# Patient Record
Sex: Male | Born: 1945
Health system: Southern US, Community
[De-identification: ages and names within clinical notes are randomized; demographics above are authoritative.]

## PROBLEM LIST (undated history)

## (undated) DIAGNOSIS — J449 Chronic obstructive pulmonary disease, unspecified: Secondary | ICD-10-CM

## (undated) DIAGNOSIS — K279 Peptic ulcer, site unspecified, unspecified as acute or chronic, without hemorrhage or perforation: Secondary | ICD-10-CM

## (undated) DIAGNOSIS — F329 Major depressive disorder, single episode, unspecified: Secondary | ICD-10-CM

## (undated) DIAGNOSIS — K219 Gastro-esophageal reflux disease without esophagitis: Secondary | ICD-10-CM

## (undated) DIAGNOSIS — J45909 Unspecified asthma, uncomplicated: Secondary | ICD-10-CM

## (undated) DIAGNOSIS — R05 Cough: Secondary | ICD-10-CM

## (undated) DIAGNOSIS — R911 Solitary pulmonary nodule: Secondary | ICD-10-CM

## (undated) DIAGNOSIS — C61 Malignant neoplasm of prostate: Secondary | ICD-10-CM

## (undated) DIAGNOSIS — S82841A Displaced bimalleolar fracture of right lower leg, initial encounter for closed fracture: Secondary | ICD-10-CM

## (undated) DIAGNOSIS — R053 Chronic cough: Secondary | ICD-10-CM

## (undated) DIAGNOSIS — I2699 Other pulmonary embolism without acute cor pulmonale: Secondary | ICD-10-CM

## (undated) DIAGNOSIS — I10 Essential (primary) hypertension: Secondary | ICD-10-CM

## (undated) DIAGNOSIS — E785 Hyperlipidemia, unspecified: Secondary | ICD-10-CM

## (undated) DIAGNOSIS — K2270 Barrett's esophagus without dysplasia: Secondary | ICD-10-CM

## (undated) DIAGNOSIS — J984 Other disorders of lung: Secondary | ICD-10-CM

## (undated) HISTORY — DX: Gastro-esophageal reflux disease without esophagitis: K21.9

## (undated) HISTORY — DX: Hyperlipidemia, unspecified: E78.5

## (undated) HISTORY — DX: Malignant neoplasm of prostate: C61

## (undated) HISTORY — DX: Chronic cough: R05.3

## (undated) HISTORY — DX: Other disorders of lung: J98.4

## (undated) HISTORY — DX: Cough: R05

## (undated) HISTORY — DX: Other pulmonary embolism without acute cor pulmonale: I26.99

## (undated) HISTORY — DX: Chronic obstructive pulmonary disease, unspecified: J44.9

## (undated) HISTORY — DX: Peptic ulcer, site unspecified, unspecified as acute or chronic, without hemorrhage or perforation: K27.9

## (undated) HISTORY — DX: Barrett's esophagus without dysplasia: K22.70

## (undated) HISTORY — DX: Major depressive disorder, single episode, unspecified: F32.9

## (undated) HISTORY — DX: Solitary pulmonary nodule: R91.1

---

## 1965-08-24 DIAGNOSIS — K279 Peptic ulcer, site unspecified, unspecified as acute or chronic, without hemorrhage or perforation: Secondary | ICD-10-CM

## 1965-08-24 DIAGNOSIS — I2699 Other pulmonary embolism without acute cor pulmonale: Secondary | ICD-10-CM

## 1965-08-24 HISTORY — DX: Peptic ulcer, site unspecified, unspecified as acute or chronic, without hemorrhage or perforation: K27.9

## 1965-08-24 HISTORY — PX: REPAIR OF PERFORATED ULCER: SHX6065

## 1965-08-24 HISTORY — DX: Other pulmonary embolism without acute cor pulmonale: I26.99

## 1997-08-24 DIAGNOSIS — F32A Depression, unspecified: Secondary | ICD-10-CM

## 1997-08-24 HISTORY — DX: Depression, unspecified: F32.A

## 1997-08-24 HISTORY — PX: PROSTATECTOMY: SHX69

## 1998-03-08 ENCOUNTER — Inpatient Hospital Stay (HOSPITAL_COMMUNITY): Admission: RE | Admit: 1998-03-08 | Discharge: 1998-03-11 | Payer: Self-pay | Admitting: Urology

## 1999-12-22 ENCOUNTER — Emergency Department (HOSPITAL_COMMUNITY): Admission: EM | Admit: 1999-12-22 | Discharge: 1999-12-22 | Payer: Self-pay | Admitting: Emergency Medicine

## 1999-12-22 ENCOUNTER — Encounter: Payer: Self-pay | Admitting: Emergency Medicine

## 1999-12-23 ENCOUNTER — Encounter: Payer: Self-pay | Admitting: Emergency Medicine

## 2000-06-16 ENCOUNTER — Encounter: Admission: RE | Admit: 2000-06-16 | Discharge: 2000-06-16 | Payer: Self-pay | Admitting: Family Medicine

## 2000-06-16 ENCOUNTER — Encounter: Payer: Self-pay | Admitting: Family Medicine

## 2001-08-24 DIAGNOSIS — C61 Malignant neoplasm of prostate: Secondary | ICD-10-CM

## 2001-08-24 HISTORY — DX: Malignant neoplasm of prostate: C61

## 2001-10-04 ENCOUNTER — Ambulatory Visit: Admission: RE | Admit: 2001-10-04 | Discharge: 2002-01-02 | Payer: Self-pay | Admitting: Radiation Oncology

## 2002-07-07 ENCOUNTER — Ambulatory Visit (HOSPITAL_COMMUNITY): Admission: RE | Admit: 2002-07-07 | Discharge: 2002-07-07 | Payer: Self-pay | Admitting: Gastroenterology

## 2002-07-07 ENCOUNTER — Encounter (INDEPENDENT_AMBULATORY_CARE_PROVIDER_SITE_OTHER): Payer: Self-pay | Admitting: Specialist

## 2004-08-03 ENCOUNTER — Emergency Department (HOSPITAL_COMMUNITY): Admission: EM | Admit: 2004-08-03 | Discharge: 2004-08-03 | Payer: Self-pay | Admitting: *Deleted

## 2005-03-16 ENCOUNTER — Ambulatory Visit (HOSPITAL_BASED_OUTPATIENT_CLINIC_OR_DEPARTMENT_OTHER): Admission: RE | Admit: 2005-03-16 | Discharge: 2005-03-16 | Payer: Self-pay | Admitting: Podiatry

## 2005-03-16 ENCOUNTER — Ambulatory Visit (HOSPITAL_COMMUNITY): Admission: RE | Admit: 2005-03-16 | Discharge: 2005-03-16 | Payer: Self-pay | Admitting: Podiatry

## 2005-04-10 ENCOUNTER — Ambulatory Visit (HOSPITAL_BASED_OUTPATIENT_CLINIC_OR_DEPARTMENT_OTHER): Admission: RE | Admit: 2005-04-10 | Discharge: 2005-04-10 | Payer: Self-pay | Admitting: Urology

## 2005-04-10 ENCOUNTER — Ambulatory Visit (HOSPITAL_COMMUNITY): Admission: RE | Admit: 2005-04-10 | Discharge: 2005-04-10 | Payer: Self-pay | Admitting: Urology

## 2007-08-11 ENCOUNTER — Encounter: Payer: Self-pay | Admitting: Podiatry

## 2007-08-11 ENCOUNTER — Ambulatory Visit (HOSPITAL_BASED_OUTPATIENT_CLINIC_OR_DEPARTMENT_OTHER): Admission: RE | Admit: 2007-08-11 | Discharge: 2007-08-11 | Payer: Self-pay | Admitting: Podiatry

## 2009-02-22 ENCOUNTER — Observation Stay (HOSPITAL_COMMUNITY): Admission: EM | Admit: 2009-02-22 | Discharge: 2009-02-22 | Payer: Self-pay | Admitting: Emergency Medicine

## 2009-03-26 ENCOUNTER — Encounter: Payer: Self-pay | Admitting: Internal Medicine

## 2009-04-26 ENCOUNTER — Encounter: Payer: Self-pay | Admitting: Internal Medicine

## 2009-06-12 ENCOUNTER — Ambulatory Visit (HOSPITAL_COMMUNITY): Admission: RE | Admit: 2009-06-12 | Discharge: 2009-06-12 | Payer: Self-pay | Admitting: Family Medicine

## 2009-06-12 ENCOUNTER — Encounter: Payer: Self-pay | Admitting: Internal Medicine

## 2009-11-05 ENCOUNTER — Encounter: Payer: Self-pay | Admitting: Internal Medicine

## 2009-12-12 ENCOUNTER — Ambulatory Visit: Payer: Self-pay | Admitting: Internal Medicine

## 2009-12-12 DIAGNOSIS — Z8711 Personal history of peptic ulcer disease: Secondary | ICD-10-CM | POA: Insufficient documentation

## 2009-12-12 DIAGNOSIS — R059 Cough, unspecified: Secondary | ICD-10-CM | POA: Insufficient documentation

## 2009-12-12 DIAGNOSIS — K2270 Barrett's esophagus without dysplasia: Secondary | ICD-10-CM | POA: Insufficient documentation

## 2009-12-12 DIAGNOSIS — K219 Gastro-esophageal reflux disease without esophagitis: Secondary | ICD-10-CM | POA: Insufficient documentation

## 2009-12-12 DIAGNOSIS — R05 Cough: Secondary | ICD-10-CM

## 2009-12-12 DIAGNOSIS — J441 Chronic obstructive pulmonary disease with (acute) exacerbation: Secondary | ICD-10-CM | POA: Insufficient documentation

## 2009-12-12 DIAGNOSIS — F329 Major depressive disorder, single episode, unspecified: Secondary | ICD-10-CM | POA: Insufficient documentation

## 2009-12-12 DIAGNOSIS — J449 Chronic obstructive pulmonary disease, unspecified: Secondary | ICD-10-CM | POA: Insufficient documentation

## 2009-12-12 DIAGNOSIS — C61 Malignant neoplasm of prostate: Secondary | ICD-10-CM | POA: Insufficient documentation

## 2009-12-12 DIAGNOSIS — I2699 Other pulmonary embolism without acute cor pulmonale: Secondary | ICD-10-CM | POA: Insufficient documentation

## 2009-12-12 DIAGNOSIS — E7800 Pure hypercholesterolemia, unspecified: Secondary | ICD-10-CM | POA: Insufficient documentation

## 2010-01-08 ENCOUNTER — Ambulatory Visit: Payer: Self-pay | Admitting: Internal Medicine

## 2010-01-08 DIAGNOSIS — J984 Other disorders of lung: Secondary | ICD-10-CM | POA: Insufficient documentation

## 2010-03-26 ENCOUNTER — Ambulatory Visit: Payer: Self-pay | Admitting: Internal Medicine

## 2010-04-01 ENCOUNTER — Telehealth: Payer: Self-pay | Admitting: Internal Medicine

## 2010-04-02 ENCOUNTER — Telehealth: Payer: Self-pay | Admitting: Internal Medicine

## 2010-04-02 ENCOUNTER — Ambulatory Visit: Payer: Self-pay | Admitting: Internal Medicine

## 2010-05-07 ENCOUNTER — Ambulatory Visit: Payer: Self-pay | Admitting: Internal Medicine

## 2010-07-09 ENCOUNTER — Ambulatory Visit: Payer: Self-pay | Admitting: Internal Medicine

## 2010-08-05 ENCOUNTER — Ambulatory Visit: Payer: Self-pay | Admitting: Internal Medicine

## 2010-08-19 ENCOUNTER — Telehealth: Payer: Self-pay | Admitting: Pulmonary Disease

## 2010-09-08 ENCOUNTER — Ambulatory Visit
Admission: RE | Admit: 2010-09-08 | Discharge: 2010-09-08 | Payer: Self-pay | Source: Home / Self Care | Attending: Internal Medicine | Admitting: Internal Medicine

## 2010-09-15 ENCOUNTER — Ambulatory Visit: Admit: 2010-09-15 | Payer: Self-pay | Admitting: Internal Medicine

## 2010-09-18 ENCOUNTER — Other Ambulatory Visit: Payer: Self-pay | Admitting: Internal Medicine

## 2010-09-18 DIAGNOSIS — R059 Cough, unspecified: Secondary | ICD-10-CM

## 2010-09-18 DIAGNOSIS — R05 Cough: Secondary | ICD-10-CM

## 2010-09-23 NOTE — Assessment & Plan Note (Signed)
Summary: Pulmonary/ ext acute ov for recurrent cough > pred x 6days   Copy to:  Dr. Blair Heys Primary Provider/Referring Provider:  Dr. Blair Heys  CC:  Chest tightness and increased cough.  History of Present Illness: 65  yowm quit smoking Feb 1988 with typical smoker's cough resolved and lifelong issues with fall sneezing and coughing.  December 12, 2009 cc cough onset Oct 2010 with sneezing assoc with tickle in throat and urge to clear much worse in evening before at bedtime best when wake up in am and non productive.  Prednisone not helping  Kozlow, ENT eval no benefit per pt.  Treated aggressively for acid reflux but not for nonacid issues (still taking fish oil daily).  rec diet, 1st gen antihistamine and dex am pepcid hs and 6 days only of prednisone.  advair / dulra/ alb no better  Jan 08, 2010 4 wk followup.  Pt states that his cough has improved by about 90%.  Still has only occ cough that's dry.  No new complaints. loosing wt voluntarily, changed diet quite a bit. no sob.  rec Take delsym two tsp every 12 hours  to suppress the urge to cough. Change Nexium 40 mg before bfast and pepcid 20 mg at bedtime for drainage use chlortrimeton 4 mg every 6 hours > much better  March 26, 2010 Acute visit.  Pt c/o cough x 2 -3 wks- prod with clear sputum.  He also c/o frequent throat clearing.   rec pred/ cough suppression  April 02, 2010 Acute visit.  Pt states that cough is worse x 2 days- still prod with clear sputum.  He states wheezing worse also- notices this with coughing spells.  Slt improvement with high dose prednisone.  In retrospect maybe better with  dulera than anything else he's tried. rec restart symbicort, continue rx for GERD  see page 2     May 07, 2010 4 wk followup.  Pt states that cough is much better.  He states wheezing has resolved.  He has noticed that his cough only occurs after eating greasy foods so he has tried to avoid these. no longer using  symbicort.    attributes improvement with pepcid and otc "prelief" working better on his HB than dexilant. rec no change in rx  July 09, 2010 fine x 2 months now acute ov sick for a week with cough acute onset minimal clear and better p delsym and tramadol, has not tried back on symbicort.  no purulent sputum, mild increase sob.  Pt denies any significant sore throat, dysphagia, itching, sneezing,  nasal congestion or excess secretions,  fever, chills, sweats, unintended wt loss, pleuritic or exertional cp, hempoptysis,  orthopnea pnd or leg swelling.  Pt also denies any obvious fluctuation in symptoms with weather or environmental change or other alleviating or aggravating factors.       Current Medications (verified): 1)  Multivitamins  Tabs (Multiple Vitamin) .Marland Kitchen.. 1 Once Daily 2)  Pepcid Ac Maximum Strength 20 Mg Tabs (Famotidine) .... One At Breakfast, One At [facility] and One Bedtime 3)  Prelief 340 (65-50) Mg (Ca-P) Tabs (Calcium Glycerophosphate) .... 2 Before Each Meal 4)  Melatonin 3 Mg Tabs (Melatonin) .Marland Kitchen.. 1 At Bedtime As Needed 5)  Tramadol Hcl 50 Mg  Tabs (Tramadol Hcl) .... One To Two By Mouth Every 4-6 Hours If Needed 6)  Chlor-Trimeton 4 Mg Tabs (Chlorpheniramine Maleate) .... One Every 6 Hours As Needed For Drainage  Allergies (verified): 1)  !  Morphine  Past History:  Past Medical History: Pulmonary embolism 1967 PUD sp perf 1967 Prostate ca 2003 Depression 1999 GERD with Barretts..................................Marland KitchenBuccini     - EGD  2009 Pos > EGD  04/18/10 :  "ok" per verbal report Hyperlipidemia Chronic cough.............................................Marland KitchenWert     - Onset Oct 2010, better after prednisone short course 12/2009 COPD - GOLD II       - PFT's 06/12/09 FEV1 2.25 (65%) ratio 62 and no better with B2  SPN RMl      - CT Chest 11/05/2009 > tickle file for one year f/u  Vital Signs:  Patient profile:   65 year old male Weight:      248 pounds O2 Sat:       94 % on Room air Temp:     98.0 degrees F oral Pulse rate:   76 / minute BP sitting:   110 / 76  (left arm)  Vitals Entered By: Vernie Murders (July 09, 2010 8:49 AM)  O2 Flow:  Room air  Physical Exam  Additional Exam:  wt  234 Jan 08, 2010 > 234 March 26, 2010  > 233 April 02, 2010 > 235 May 07, 2010 > 248 July 09, 2010  amb anxious wm nad minimal  hoarseness  HEENT: nl dentition, turbinates, and orophanx. Nl external ear canals without cough reflex NECK :  without JVD/Nodes/TM/ nl carotid upstrokes bilaterally LUNGS: no acc muscle use,  insp and exp rhonchi with end exp cough CV:  RRR  no s3 or murmur or increase in P2, no edema  ABD:  soft and nontender with nl excursion in the supine position. No bruits or organomegaly, bowel sounds nl MS:  warm without deformities, calf tenderness, cyanosis or clubbing      CXR  Procedure date:  07/09/2010  Findings:      Comparison: 02/22/2009   Findings: The cardiomediastinal silhouette is unremarkable. Probable COPD/emphysema identified. There is no evidence of focal airspace disease, pulmonary edema, pulmonary nodule/mass, pleural effusion, or pneumothorax. No acute bony abnormalities are identified.   IMPRESSION: No evidence of acute cardiopulmonary disease.  Impression & Recommendations:  Problem # 1:  COUGH (ICD-786.2)  The most common causes of chronic cough in immunocompetent adults include: upper airway cough syndrome (UACS), previously referred to as postnasal drip syndrome,  caused by variety of rhinosinus conditions; (2) asthma; (3) GERD; (4) chronic bronchitis from cigarette smoking or other inhaled environmental irritants; (5) nonasthmatic eosinophilic bronchitis; and (6) bronchiectasis. These conditions, singly or in combination, have accounted for up to 94% of the causes of chronic cough in prospective studies.  Cough resolved for months now with exac in setting of rhinitis/ bronchitis but no purulent  sputum ? eos bronchitis ? viral ? AB with intermittent flare.  Discussed in detail all the  indications, usual  risks and alternatives  relative to the benefits with patient who agrees to proceed with only short course of prednisone, this time without symbicort with low threshold to add it if not improving  Orders: Est. Patient Level IV (69629) Prescription Created Electronically [phone])  Problem # 2:  PULMONARY NODULE (ICD-518.89) Not present on plain cxr, already in tickle file for recall 10/2010  Medications Added to Medication List This Visit: 1)  Prednisone 10 Mg Tabs (Prednisone) .... 4 each am x 2days, 2x2days, 1x2days and stop  Other Orders: T-2 View CXR ([id number])  Patient Instructions: 1)  Prednisone 10mg  4 each am x 2days, 2x2days, 1x2days and stop  2)  Return to office in 3 months, sooner if needed  Prescriptions: PREDNISONE 10 MG  TABS (PREDNISONE) 4 each am x 2days, 2x2days, 1x2days and stop  #14 x 0   Entered and Authorized by:   Nyoka Cowden MD   Signed by:   Nyoka Cowden MD on 07/09/2010   Method used:   Electronically to        [id number] (retail)       [address]       [place], [place]  [address]       Ph: [phone] or [phone]       Fax: [phone]   RxID:   [id number]

## 2010-09-23 NOTE — Letter (Signed)
Summary: [facility]  [facility]   Imported By: Lester Elberta 12/23/2009 10:05:19  _____________________________________________________________________  External Attachment:    Type:   Image     Comment:   External Document

## 2010-09-23 NOTE — Assessment & Plan Note (Signed)
Summary: Pulmonary/ ext summary f/u ov   Copy to:  Dr. Blair Heys Primary Provider/Referring Provider:  Dr. Blair Heys  CC:  4 wk followup.  Pt states that cough is much better.  He states wheezing has resolved.  He has noticed that his cough only occurs after eating greasy foods so he has tried to avoid these.Marland Kitchen  History of Present Illness: 36  yowm quit smoking Feb 1988 with typical smoker's cough resolved and lifelong issues with fall sneezing and coughing.  December 12, 2009 cc cough onset Oct 2010 with sneezing assoc with tickle in throat and urge to clear much worse in evening before at bedtime best when wake up in am and non productive.  Prednisone not helping  Kozlow, ENT eval no benefit per pt.  Treated aggressively for acid reflux but not for nonacid issues (still taking fish oil daily).  rec diet, 1st gen antihistamine and dex am pepcid hs and 6 days only of prednisone.  advair / dulra/ alb no better  Jan 08, 2010 4 wk followup.  Pt states that his cough has improved by about 90%.  Still has only occ cough that's dry.  No new complaints. loosing wt voluntarily, changed diet quite a bit. no sob.  rec Take delsym two tsp every 12 hours  to suppress the urge to cough. Change Nexium 40 mg before bfast and pepcid 20 mg at bedtime for drainage use chlortrimeton 4 mg every 6 hours > much better  March 26, 2010 Acute visit.  Pt c/o cough x 2 -3 wks- prod with clear sputum.  He also c/o frequent throat clearing.   rec pred/ cough suppression  April 02, 2010 Acute visit.  Pt states that cough is worse x 2 days- still prod with clear sputum.  He states wheezing worse also- notices this with coughing spells.  Slt improvement with high dose prednisone.  In retrospect maybe better with  dulera than anything else he's tried. rec restart symbicort, continue rx for GERD  see page 2     May 07, 2010 4 wk followup.  Pt states that cough is much better.  He states wheezing has resolved.   He has noticed that his cough only occurs after eating greasy foods so he has tried to avoid these. no longer using symbicort.  Pt denies any significant sore throat, dysphagia, itching, sneezing,  nasal congestion or excess secretions,  fever, chills, sweats, unintended wt loss, pleuritic or exertional cp, hempoptysis, change in activity tolerance  orthopnea pnd or leg swelling.  Pt also denies any obvious fluctuation in symptoms with weather or environmental change or other alleviating or aggravating factors.     attributes improvement with pepcid and otc "prelief" working better on his HB than dexilant.  Current Medications (verified): 1)  Multivitamins  Tabs (Multiple Vitamin) .Marland Kitchen.. 1 Once Daily 2)  Melatonin 3 Mg Tabs (Melatonin) .Marland Kitchen.. 1 At Bedtime As Needed 3)  Pepcid Ac Maximum Strength 20 Mg Tabs (Famotidine) .... One At Bedtime 4)  Tramadol Hcl 50 Mg  Tabs (Tramadol Hcl) .... One To Two By Mouth Every 4-6 Hours If Needed 5)  Prelief 340 (65-50) Mg (Ca-P) Tabs (Calcium Glycerophosphate) .... 2 Before Each Meal  Allergies (verified): 1)  ! Morphine  Past History:  Past Medical History: Pulmonary embolism 1967 PUD sp perf 1967 Prostate ca 2003 Depression 1999 GERD with Barretts..................................Marland KitchenBuccini     - EGD  2009 Pos > EGD  04/18/10 :  "ok" per verbal report  Hyperlipidemia Chronic cough.............................................Marland KitchenWert     - Onset Oct 2010, better after prednisone short course 12/2009 COPD - GOLD II       - PFT's 06/12/09 FEV1 2.25 (65%) ratio 62 and no better with B2  SPN RMl      - CT Chest 11/05/2009 > tickle file for one year f/u  Vital Signs:  Patient profile:   65 year old male Weight:      235.50 pounds O2 Sat:      94 % on Room air Temp:     97.6 degrees F oral Pulse rate:   74 / minute BP sitting:   112 / 80  (left arm)  Vitals Entered By: Vernie Murders (May 07, 2010 9:32 AM)  O2 Flow:  Room air  Physical  Exam  Additional Exam:  wt  234 Jan 08, 2010 > 234 March 26, 2010  > 233 April 02, 2010 > 235 May 07, 2010  amb anxious wm nad minimal  hoarseness  HEENT: nl dentition, turbinates, and orophanx. Nl external ear canals without cough reflex NECK :  without JVD/Nodes/TM/ nl carotid upstrokes bilaterally LUNGS: no acc muscle use, clear to A and P bilaterally without cough on insp or exp maneuvers CV:  RRR  no s3 or murmur or increase in P2, no edema  ABD:  soft and nontender with nl excursion in the supine position. No bruits or organomegaly, bowel sounds nl MS:  warm without deformities, calf tenderness, cyanosis or clubbing      Impression & Recommendations:  Problem # 1:  COUGH (ICD-786.2)  The most common causes of chronic cough in immunocompetent adults include: upper airway cough syndrome (UACS), previously referred to as postnasal drip syndrome,  caused by variety of rhinosinus conditions; (2) asthma; (3) GERD; (4) chronic bronchitis from cigarette smoking or other inhaled environmental irritants; (5) nonasthmatic eosinophilic bronchitis; and (6) bronchiectasis. These conditions, singly or in combination, have accounted for up to 94% of the causes of chronic cough in prospective studies.  flared on rx with ppi so could have asthma as many pts have more than one underlying cause and prednisone response suggests asthma, eos bronchitis and rhinitis and does have baseline airflow obstruction does not meet criteria for MCT     Clearly related to GERD/ cylical cough with one major caveat:  NB the  ramp to expected improvement (and for that matter, worsening, if a chronic effective medication is stopped)  can be measured in weeks, not days, a common misconception because this is not Heartburn with no immediate cause and effect relationship so that response to therapy or lack thereof can be very difficult to assess.   Each maintenance medication was reviewed in detail including most  importantly the difference between maintenance prns and under what circumstances the prns are to be used.  In addition, these two groups (for which the patient should keep up with refills) were distinguished from a third group :  meds that are used only short term with the intent to complete a course of therapy and then not refill them.  The med list was then fully reconciled and reorganized to reflect this important distinction.   Problem # 2:  COPD UNSPECIFIED (ICD-496) ok for now to leave off symbicort with low threshold to add back based on pft's if symptoms of cough or sob worsen  Explaind to pt: Unlike when you get a prescription for eyeglasses, it's not possible to always walk out of this or any  medical office with a perfect prescription that is immediately effective  based on any test that we offer here.  On the contrary, it may take several weeks for the full impact of changes recommened today - hopefully you will respond well.  If not, then we'll adjust your medication on your next visit accordingly, knowing more then than we can possibly know now.     Medications Added to Medication List This Visit: 1)  Pepcid Ac Maximum Strength 20 Mg Tabs (Famotidine) .... One at breakfast, one at lunch and one bedtime 2)  Prelief 340 (65-50) Mg (ca-p) Tabs (Calcium glycerophosphate) .... 2 before each meal 3)  Chlor-trimeton 4 Mg Tabs (Chlorpheniramine maleate) .... One every 6 hours as needed for drainage  Other Orders: Est. Patient Level IV (09811)  Patient Instructions: 1)  Return to office in 3 months, sooner if needed  2)  Think of your medications in 3 separate categories and keep them separate:  3)  a)   The ones you take no matter what daily on a scheduled basis 4)  b)   The ones you only take if needed for specific problemsc 5)  c)   The ones you take for a short course and stop, like antibiotics and prednisone. 6)

## 2010-09-23 NOTE — Progress Notes (Signed)
Summary: Pt has docmented barretts  Phone Note From Other Clinic   Caller: dr Matthias Hughs Call For: wert Summary of Call: re: earlier conversation / mutual pt. pt had endoscopy in 2009- "short segment-barrett's esophagus- no dysphagia- pt scheduled for repeat endo in 04/18/10. pt is on PPI. dr Matthias Hughs will fax this info to dr wert if he can but wanted dr wert to have this info in the meantime. dr Matthias Hughs # [phone] Initial call taken by: Tivis Ringer, CNA,  April 01, 2010 4:58 PM  Follow-up for Phone Call        will forward message to MW as an Lorain Childes.  Aundra Millet Reynolds LPN  April 01, 2010 4:59 PM  aware, thanks Follow-up by: Nyoka Cowden MD,  April 02, 2010 8:45 AM

## 2010-09-23 NOTE — Assessment & Plan Note (Signed)
Summary: Pulmonary/ recurrent cough better p prednisone > MCT next   Copy to:  Dr. Blair Heys Primary /Referring :  Dr. Blair Heys  CC:  Acute visit.  Pt c/o cough x 2 wks- prod with clear sputum.  He also c/o frequent throat clearing.  Carlos Romero  History of Present Illness: 65  yowm quit smoking Feb 1988 with typical smoker's cough resolved and lifelong issues with fall sneezing and coughing.  December 12, 2009 cc cough onset Oct 2010 with sneezing assoc with tickle in throat and urge to clear much worse in evening before at bedtime best when wake up in am and non productive.  Prednisone not helping  Kozlow, ENT eval no benefit per pt.  Treated aggressively for acid reflux but not for nonacid issues (still taking fish oil daily).  rec diet, 1st gen antihistamine and dex am pepcid hs and 6 days only of prednisone  Jan 08, 2010 4 wk followup.  Pt states that his cough has improved by about 90%.  Still has only occ cough that's dry.  No new complaints. loosing wt voluntarily, changed diet quite a bit. no sob.  rec Take delsym two tsp every 12 hours  to suppress the urge to cough. Change Nexium 40 mg before bfast and pepcid 20 mg at bedtime for drainage use chlortrimeton 4 mg every 6 hours > much better  March 26, 2010 Acute visit.  Pt c/o cough x 2 -3 wks- prod with clear sputum.  He also c/o frequent throat clearing.  Pt denies any significant sore throat, dysphagia, itching, sneezing,  nasal congestion or excess secretions,  fever, chills, sweats, unintended wt loss, pleuritic or exertional cp, hempoptysis, change in activity tolerance  orthopnea pnd or leg swelling. Pt also denies any obvious fluctuation in symptoms with weather or environmental change or other alleviating or aggravating factors.       Current Medications (verified): 1)  Dexilant 60 Mg Cpdr (Dexlansoprazole) .... Take  One 30-60 Min Before First Meal of The Day 2)  Multivitamins  Tabs (Multiple Vitamin) .Carlos Romero.. 1  Once Daily 3)  Melatonin 3 Mg Tabs (Melatonin) .Carlos Romero.. 1 At Bedtime As Needed 4)  Pepcid Ac Maximum Strength 20 Mg Tabs (Famotidine) .... One At Bedtime  Allergies (verified): 1)  ! Morphine  Past History:  Past Medical History: Pulmonary embolism 65 PUD sp perf 1967 Prostate ca 2003 Depression 1999 GERD with Barretts..................................Carlos KitchenBuccini Hyperlipidemia Chronic cough.............................................Carlos KitchenWert     - Onset Oct 2010, better after prednisone short course 12/2009      -Methacholine chanllenge ordered March 26, 2010  COPD - GOLD II       - PFT's 06/12/09 FEV1 2.25 (65%) ratio 62 and no better with B2  SPN RMl      - CT Chest 11/05/2009 > tickle file for one year f/u  Vital Signs:  Patient profile:   65 year old male Weight:      234 pounds O2 Sat:      98 % on Room air Temp:     98.1 degrees F oral Pulse rate:   70 / minute BP sitting:   118 / 80  (left arm)  Vitals Entered By: Vernie Murders (March 26, 2010 2:03 PM)  O2 Flow:  Room air  Physical Exam  Additional Exam:  wt  234 Jan 08, 2010 > 234 March 26, 2010  amb anxious wm nad mild hoarseness  HEENT: nl dentition, turbinates, and orophanx. Nl external ear canals  without cough reflex NECK :  without JVD/Nodes/TM/ nl carotid upstrokes bilaterally LUNGS: no acc muscle use, clear to A and P bilaterally without cough on insp or exp maneuvers CV:  RRR  no s3 or murmur or increase in P2, no edema  ABD:  soft and nontender with nl excursion in the supine position. No bruits or organomegaly, bowel sounds nl MS:  warm without deformities, calf tenderness, cyanosis or clubbing      Impression & Recommendations:  Problem # 1:  COUGH (ICD-786.2) The most common causes of chronic cough in immunocompetent adults include: upper airway cough syndrome (UACS), previously referred to as postnasal drip syndrome,  caused by variety of rhinosinus conditions; (2) asthma; (3) GERD; (4) chronic  bronchitis from cigarette smoking or other inhaled environmental irritants; (5) nonasthmatic eosinophilic bronchitis; and (6) bronchiectasis. These conditions, singly or in combination, have accounted for up to 94% of the causes of chronic cough in prospective studies.  flared on rx with ppi so could have asthma as many pts have more than one underlying cause and prednisone response suggests asthma, eos bronchitis and rhinitis  See instructions for specific recommendations   The standardized cough guidelines recently published in Chest are a 14 step process, not a single office visit,  and are intended  to address this problem logically,  with an alogrithm dependent on response to each progressive step  to determine a specific diagnosis with  minimal addtional testing needed. Therefore if compliance is an issue this empiric standardized approach simply won't work.   Medications Added to Medication List This Visit: 1)  Tramadol Hcl 50 Mg Tabs (Tramadol hcl) .... One to two by mouth every 4-6 hours if needed 2)  Prednisone 10 Mg Tabs (Prednisone) .... 4 each am x 2days, 2x2days, 1x2days and stop  Other Orders: Misc. Referral (Misc. Ref) Est. Patient Level IV (16109)  Patient Instructions: 1)  Prednisone 10 mg 4 each am x 2days, 2x2days, 1x2days and stop  2)  Take delsym two tsp every 12 hours and add tramadol 50 mg up to every 4 hours to suppress the urge to cough. Swallowing water or using ice chips/non mint and menthol containing candies (such as lifesavers or sugarless jolly ranchers) are also effective. 3)  for throat drainage use chlortrimeton  4)  GERD (REFLUX)  is a common cause of respiratory symptoms. It commonly presents without heartburn and can be treated with medication, but also with lifestyle changes including avoidance of late meals, excessive alcohol, smoking cessation, and avoid fatty foods, chocolate, peppermint, colas, red wine, and acidic juices such as orange juice. NO MINT OR  MENTHOL PRODUCTS SO NO COUGH DROPS  5)  USE SUGARLESS CANDY INSTEAD (jolley ranchers)  6)  NO OIL BASED VITAMINS  7)  See Patient Care Coordinator before leaving for methacholine challenge test 8)  Copy sent to: Buccini/ Inger Prescriptions: PREDNISONE 10 MG  TABS (PREDNISONE) 4 each am x 2days, 2x2days, 1x2days and stop  #14 x 0   Entered and Authorized by:   Nyoka Cowden MD   Signed by:   Nyoka Cowden MD on 03/26/2010   Method used:   Electronically to        [id number]       [address]       [place], [place]  [address]       Ph: [phone] or [phone]       Fax: [phone]   RxID:   [id number] TRAMADOL  HCL 50 MG  TABS (TRAMADOL HCL) One to two by mouth every 4-6 hours if needed  #40 x 0   Entered and Authorized by:   Nyoka Cowden MD   Signed by:   Nyoka Cowden MD on 03/26/2010   Method used:   Electronically to        [id number] ([facility]       [address]       [place], [place]  [address]       Ph: [phone] or [phone]       Fax: [phone]   RxID:   [id number]

## 2010-09-23 NOTE — Assessment & Plan Note (Signed)
Summary: Pulmonary/ cough eval - try max gerd rx and 1st gen H1   Visit Type:  Initial Consult Copy to:  Dr. Blair Heys Primary Provider/Referring Provider:  Dr. Blair Heys  CC:  Cough.  History of Present Illness: 65 yowm quit smoking Feb 1988 with typical smoker's cough resolved and lifelong issues with fall sneezing and coughing.  December 12, 2009 cc cough onset Oct 2010 with sneezing assoc with tickle in throat and urge to clear much worse in evening before at bedtime best when wake up in am and non productive.  Prednisone not helping  Kozlow, ENT eval no benefit per pt.  Treated aggressively for acid reflux but not for nonacid issues (still taking fish oil daily).    Pt denies any significant sore throat, dysphagia, itching, ,  nasal congestion or excess secretions,  fever, chills, sweats, unintended wt loss, pleuritic or exertional cp, hempoptysis, change in activity tolerance  orthopnea pnd or leg swelling Pt also denies any obvious fluctuation in symptoms with weather or environmental change or other alleviating or aggravating factors.       Current Medications (verified): 1)  Mucinex 600 Mg Xr12h-Tab (Guaifenesin) .Marland Kitchen.. 1 Two Times A Day 2)  Dexilant 60 Mg Cpdr (Dexlansoprazole) .Marland Kitchen.. 1 Two Times A Day 3)  Aspirin 81 Mg Tbec (Aspirin) .Marland Kitchen.. 1 Once Daily 4)  Dulera (? Strength) .... 2 Puffs Two Times A Day 5)  Vitamin D-3 5000 .Marland Kitchen.. 1 Once Daily 6)  Vitamin C 500 Mg Tabs (Ascorbic Acid) .Marland Kitchen.. 1 Once Daily 7)  Multivitamins  Tabs (Multiple Vitamin) .Marland Kitchen.. 1 Once Daily 8)  Fish Oil 1000 Mg Caps (Omega-3 Fatty Acids) .Marland Kitchen.. 1 Once Daily 9)  Melatonin 3 Mg Tabs (Melatonin) .Marland Kitchen.. 1 At Bedtime As Needed 10)  Diphenhydramine Hcl 25 Mg Caps (Diphenhydramine Hcl) .Marland Kitchen.. 1 At Bedtime As Needed  Allergies (verified): 1)  ! 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  Quit in 1988.  Smoked up to 1 ppd x 20 yrs No ETOH since 2010  Review of Systems       The patient complains of shortness of breath with activity, productive cough, acid heartburn, indigestion, nasal congestion/difficulty breathing through nose, and sneezing.  The patient denies shortness of breath at rest, non-productive cough, coughing up blood, chest pain, irregular heartbeats, loss of appetite, weight change, abdominal pain, difficulty swallowing, sore throat, tooth/dental problems, headaches, itching, ear ache, anxiety, depression, hand/feet swelling, joint stiffness or pain, rash, change in color of mucus, and fever.    Vital Signs:  Patient profile:   65 year old male Height:      73 inches Weight:      252.25 pounds BMI:     33.40 O2 Sat:      95 % on Room air Temp:     98.0 degrees F oral Pulse rate:   72 / minute BP sitting:   152 / 68  (left arm)  Vitals Entered By: Vernie Murders (December 12, 2009 10:54 AM)  O2 Flow:  Room air  Physical Exam  Additional Exam:  wt 252 amb anxious wm nad mod hoarseness  HEENT: nl dentition, turbinates, and orophanx. Nl external ear canals without cough reflex NECK :  without JVD/Nodes/TM/ nl carotid upstrokes bilaterally LUNGS: no acc muscle use, clear to A and P bilaterally without cough on insp or exp maneuvers CV:  RRR  no s3 or murmur or increase in P2, no edema  ABD:  soft and nontender with nl excursion in the supine position. No bruits or organomegaly, bowel sounds nl MS:  warm without deformities, calf tenderness, cyanosis or clubbing SKIN:  warm and dry without lesions   NEURO:  alert, approp, no deficits     CT of Chest  Procedure date:  11/05/2009  Findings:      4-5 mm noncalcified rml nodule no chage since 04/26/09  Impression & Recommendations:  Problem # 1:  COUGH (ICD-786.2)  The most common causes of chronic cough in immunocompetent adults include: upper airway cough syndrome (UACS), previously referred to as postnasal drip syndrome,  caused by variety of rhinosinus conditions; (2) asthma; (3) GERD; (4) chronic bronchitis from cigarette smoking or other inhaled environmental irritants; (5) nonasthmatic eosinophilic bronchitis; and (6) bronchiectasis. These conditions, singly or in combination, have accounted for up to 94% of the causes of chronic cough in prospective studies.  This is most likely  Classic Upper airway cough syndrome, so named because it's frequently impossible to sort out how much is  CR/sinusitis with freq throat clearing (which can be related to primary GERD)   vs  causing  secondary extra esophageal GERD from wide swings in gastric pressure that occur with throat clearing, promoting self use of mint and menthol lozenges that reduce the lower esophageal sphincter tone and exacerbate the problem further These are the same pts who not infrequently have failed to tolerate ace inhibitors,  dry powder inhalers or biphosphonates or report having reflux symptoms that don't respond to standard doses of PPI  Of the three most common causes of chronic cough, only one can actually cause the other two and perpetuate the cylce of cough inducing airway trauma, inflammation, heightened sensitivity to reflux which is prompted by the cough itself via a cyclical mechanism.  This may partially respond to steroids and look like asthma and post nasal drainage but never erradicated completely unless the cough and the secondary reflux are eliminated, preferably both at the same time.  See instructions for specific recommendations      The standardized cough guideline  published in Chest are a 14 step process, not a single office visit,  and are intended  to address this problem logically,  with an alogrithm dependent on response to each progressive step  to determine a specific diagnosis with  minimal addtional testing needed.   Problem # 2:  GERD (ICD-530.81)  His updated medication list for this problem includes:    Dexilant 60 Mg Cpdr (Dexlansoprazole) .Marland Kitchen... Take  one 30-60 min before first meal of the day    Pepcid Ac Maximum Strength 20 Mg Tabs (Famotidine) ..... One at bedtime  Has barretts hx so clearly not only needs lifelong heavy acid suppression but also a diet free of oils which may be just as irritating to the upper airway as acid. See instructions for specific recommendations   Problem # 3:  COPD UNSPECIFIED (ICD-496) PFt's suggest mild airflow obstruction but no better on dulera and hx most c/w uacs, not asthma. No treatment needed  at this point; in fact, most inhaled agents can aggravate upper airway cough syndrome, which appears to be the case here.  Medications Added to Medication List This Visit: 1)  Dexilant 60 Mg Cpdr (Dexlansoprazole) .... Take  one 30-60 min before first meal of the day 2)  Dexilant 60 Mg Cpdr (Dexlansoprazole) .Marland Kitchen.. 1 two times a day 3)  Aspirin 81 Mg Tbec (Aspirin) .Marland Kitchen.. 1 once daily 4)  Vitamin D-3 5000  .Marland Kitchen.. 1 once daily 5)  Dulera (? Strength)  .... 2 puffs two times a day 6)  Vitamin C 500 Mg Tabs (Ascorbic acid) .Marland Kitchen.. 1 once daily 7)  Multivitamins Tabs (Multiple vitamin) .Marland Kitchen.. 1 once daily 8)  Fish Oil 1000 Mg Caps (Omega-3 fatty acids) .Marland Kitchen.. 1 once daily 9)  Melatonin 3 Mg Tabs (Melatonin) .Marland Kitchen.. 1 at bedtime as needed 10)  Diphenhydramine Hcl 25 Mg Caps (Diphenhydramine hcl) .Marland Kitchen.. 1 at bedtime as needed 11)  Mucinex 600 Mg Xr12h-tab (Guaifenesin) .Marland Kitchen.. 1 two times a day 12)  Pepcid Ac Maximum Strength 20 Mg Tabs (Famotidine) .... One at bedtime 13)  Prednisone 10 Mg Tabs  (Prednisone) .... 4 each am x 2days, 2x2days, 1x2days and stop 14)  Tramadol Hcl 50 Mg Tabs (Tramadol hcl) .... One to two by mouth every 4-6 hours every 6 hours as needed 15)  Chlorpheniramine Maleate 4 Mg Tabs (Chlorpheniramine maleate) .... One every 6 hours as needed for tickle for tickle sneezing itching or drainage.  Other Orders: Prescription Created Electronically [id number]) Consultation Level V [phone])  Patient Instructions: 1)  See flyer 2)  Stop all oil based vitamins 3)  Prednisone 10 mg  4 each am x 2days, 2x2days, 1x2days and stop 4)  Take delsym two tsp every 12 hours and add tramadol 50 mg up to every 4 hours to suppress the urge to cough. Swallowing water or using ice chips/non mint and menthol containing candies (such as lifesavers or sugarless jolly ranchers) are also effective.  5)  Change dexilant to 60 mg before bfast and pepcid 20 mg at bedtime 6)  for drainage use chlortrimeton 4 mg every 6 hours 7)  GERD (REFLUX)  is a common cause of respiratory symptoms. It commonly presents without heartburn and can be treated with medication, but also with lifestyle changes including avoidance of late meals, excessive alcohol, smoking cessation, and avoid fatty foods, chocolate, peppermint, colas, red wine, and acidic juices such as orange juice. NO MINT OR MENTHOL PRODUCTS SO NO COUGH DROPS  8)  USE SUGARLESS CANDY INSTEAD (jolley ranchers)  9)  NO OIL BASED VITAMINS  10)  Please schedule a follow-up appointment in 2 weeks, sooner if needed  Prescriptions: TRAMADOL HCL 50 MG  TABS (TRAMADOL HCL) One to two by mouth every 4-6 hours every 6 hours as needed  #40 x 0   Entered and Authorized by:   Nyoka Cowden MD   Signed by:   Nyoka Cowden MD on 12/12/2009   Method used:   Electronically to        [id number] (retail)       [address]       [place], [place]  [address]       Ph: [phone] or [phone]       Fax: [phone]   RxID:   [id number] PREDNISONE 10  MG  TABS (PREDNISONE) 4 each am x 2days, 2x2days, 1x2days and stop  #14 x 0   Entered and Authorized by:   Casimiro Needle  Denice Paradise MD   Signed by:   Nyoka Cowden MD on 12/12/2009   Method used:   Electronically to        [id number] (retail)       [address]       [place], [place]  [address]       Ph: [phone] or [phone]       Fax: [phone]   RxID:   [id number]    CT of Chest  Procedure date:  11/05/2009  Findings:      4-5 mm noncalcified rml nodule no chage since 04/26/09

## 2010-09-23 NOTE — Assessment & Plan Note (Signed)
Summary: Pulmonary/  ext ov with hfa coaching @ 75% effective   Copy to:  Dr. Blair Heys Primary /Referring :  Dr. Blair Heys  CC:  Acute visit.  Pt states that cough is worse x 2 days- still prod with clear sputum.  He states wheezing worse also- notices this with coughing spells..  History of Present Illness: 65  yowm quit smoking Feb 1988 with typical smoker's cough resolved and lifelong issues with fall sneezing and coughing.  December 12, 2009 cc cough onset Oct 2010 with sneezing assoc with tickle in throat and urge to clear much worse in evening before at bedtime best when wake up in am and non productive.  Prednisone not helping  Kozlow, ENT eval no benefit per pt.  Treated aggressively for acid reflux but not for nonacid issues (still taking fish oil daily).  rec diet, 1st gen antihistamine and dex am pepcid hs and 6 days only of prednisone.  advair / dulra/ alb no better  Jan 08, 2010 4 wk followup.  Pt states that his cough has improved by about 90%.  Still has only occ cough that's dry.  No new complaints. loosing wt voluntarily, changed diet quite a bit. no sob.  rec Take delsym two tsp every 12 hours  to suppress the urge to cough. Change Nexium 40 mg before bfast and pepcid 20 mg at bedtime for drainage use chlortrimeton 4 mg every 6 hours > much better  March 26, 2010 Acute visit.  Pt c/o cough x 2 -3 wks- prod with clear sputum.  He also c/o frequent throat clearing.   rec pred/ cough suppression, Methacholine challnenge  April 02, 2010 Acute visit.  Pt states that cough is worse x 2 days- still prod with clear sputum.  He states wheezing worse also- notices this with coughing spells.  Slt improvement with high dose prednisone.  In retrospect maybe better iwth dulera than anything else he's tried  Pt denies any significant sore throat, dysphagia, itching, sneezing,  nasal congestion or excess secretions,  fever, chills, sweats, unintended wt loss,  pleuritic or exertional cp, hempoptysis, change in activity tolerance  orthopnea pnd or leg swelling. Pt also denies any obvious fluctuation in symptoms with weather or environmental change or other alleviating or aggravating factors.       Allergies (verified): 1)  ! Morphine  Past History:  Past Medical History: Pulmonary embolism 1967 PUD sp perf 1967 Prostate ca 2003 Depression 1999 GERD with Barretts..................................Marland KitchenBuccini     - EGE 2009 Pos Hyperlipidemia Chronic cough.............................................Marland KitchenWert     - Onset Oct 2010, better after prednisone short course 12/2009      -Methacholine chanllenge ordered March 26, 2010  COPD - GOLD II       - PFT's 06/12/09 FEV1 2.25 (65%) ratio 62 and no better with B2  SPN RMl      - CT Chest 11/05/2009 > tickle file for one year f/u  Vital Signs:  Patient profile:   65 year old male Weight:      233.13 pounds O2 Sat:      93 % on Room air Temp:     98.1 degrees F oral Pulse rate:   70 / minute BP sitting:   122 / 78  (left arm)  Vitals Entered By: Vernie Murders (April 02, 2010 11:51 AM)  O2 Flow:  Room air  Physical Exam  Additional Exam:  wt  234 Jan 08, 2010 > 234 March 26, 2010  > 233 April 02, 2010  amb anxious wm nad mild hoarseness  HEENT: nl dentition, turbinates, and orophanx. Nl external ear canals without cough reflex NECK :  without JVD/Nodes/TM/ nl carotid upstrokes bilaterally LUNGS: no acc muscle use, clear to A and P bilaterally without cough on insp or exp maneuvers CV:  RRR  no s3 or murmur or increase in P2, no edema  ABD:  soft and nontender with nl excursion in the supine position. No bruits or organomegaly, bowel sounds nl MS:  warm without deformities, calf tenderness, cyanosis or clubbing      Impression & Recommendations:  Problem # 1:  COUGH (ICD-786.2)     The most common causes of chronic cough in immunocompetent adults include: upper airway cough  syndrome (UACS), previously referred to as postnasal drip syndrome,  caused by variety of rhinosinus conditions; (2) asthma; (3) GERD; (4) chronic bronchitis from cigarette smoking or other inhaled environmental irritants; (5) nonasthmatic eosinophilic bronchitis; and (6) bronchiectasis. These conditions, singly or in combination, have accounted for up to 94% of the causes of chronic cough in prospective studies.  flared on rx with ppi so could have asthma as many pts have more than one underlying cause and prednisone response suggests asthma, eos bronchitis and rhinitis and does have baseline airflow obstruction so may not meet criteria for MCT  See instructions for specific recommendations  I spent extra time with the patient today explaining optimal mdi  technique.  This improved from  25-75% p coaching  NB The standardized cough guidelines recently published in Chest are a 14 step process, not a single office visit,  and are intended  to address this problem logically,  with an alogrithm dependent on response to each progressive step  to determine a specific diagnosis with  minimal addtional testing needed. Therefore if compliance is an issue this empiric standardized approach simply won't work.   Orders: Est. Patient Level IV (18841) Prescription Created Electronically [id number])  Medications Added to Medication List This Visit: 1)  Symbicort 160-4.5 Mcg/act Aero (Budesonide-formoterol fumarate) .... 2 puffs first thing  in am and 2 puffs again in pm about 12 hours later 2)  Prednisone 10 Mg Tabs (Prednisone) .... 4 each am x 2days, 2x2days, 1x2days and stop  Patient Instructions: 1)  Please schedule a follow-up appointment in 4 weeks, sooner if needed  2)  symbicort 160 mg 2 puffs first thing  in am and 2 puffs again in pm about 12 hours later  3)  Work on inhaler technique:  relax and blow all the way out then take a nice smooth deep breath back in, triggering the inhaler at same time you  start breathing in hold about 5 secs and then rinse and garglle Prescriptions: PREDNISONE 10 MG TABS (PREDNISONE) 4 each am x 2days, 2x2days, 1x2days and stop  #14 x 0   Entered and Authorized by:   Nyoka Cowden MD   Signed by:   Nyoka Cowden MD on 04/02/2010   Method used:   Print then Give to Patient   RxID:   [id number] SYMBICORT 160-4.5 MCG/ACT AERO (BUDESONIDE-FORMOTEROL FUMARATE) 2 puffs first thing  in am and 2 puffs again in pm about 12 hours later  #1 x 11   Entered and Authorized by:   Nyoka Cowden MD   Signed by:   Nyoka Cowden MD on 04/02/2010   Method used:   Print then Give to Patient   RxID:  [id number]  

## 2010-09-23 NOTE — Assessment & Plan Note (Signed)
Summary: Pulmonary/ ext summary f/u ov cough resolved to his satisfaction   Copy to:  Dr. Blair Heys Primary Provider/Referring Provider:  Dr. Blair Heys  CC:  4 wk followup.  Pt states that his cough has improved by about 90%.  Still has only occ cough that's dry.  No new complaints..  History of Present Illness: 65 yowm quit smoking Feb 1988 with typical smoker's cough resolved and lifelong issues with fall sneezing and coughing.  December 12, 2009 cc cough onset Oct 2010 with sneezing assoc with tickle in throat and urge to clear much worse in evening before at bedtime best when wake up in am and non productive.  Prednisone not helping  Kozlow, ENT eval no benefit per pt.  Treated aggressively for acid reflux but not for nonacid issues (still taking fish oil daily).  rec diet, 1st gen antihistamine and dex am pepcid hs and 6 days only of prednisone  Jan 08, 2010 65 wk followup.  Pt states that his cough has improved by about 90%.  Still has only occ cough that's dry.  No new complaints. loosing wt voluntarily, changed diet quite a bit. no sob. Pt denies any significant sore throat, dysphagia, itching, sneezing,  nasal congestion or excess secretions,  fever, chills, sweats, unintended wt loss, pleuritic or exertional cp, hempoptysis, change in activity tolerance  orthopnea pnd or leg swelling. sees Buccini for GERD         Current Medications (verified): 1)  Dexilant 60 Mg Cpdr (Dexlansoprazole) .... Take  One 30-60 Min Before First Meal of The Day 2)  Vitamin C 500 Mg Tabs (Ascorbic Acid) .Marland Kitchen.. 1 Once Daily 3)  Multivitamins  Tabs (Multiple Vitamin) .Marland Kitchen.. 1 Once Daily 4)  Melatonin 3 Mg Tabs (Melatonin) .Marland Kitchen.. 1 At Bedtime As Needed 5)  Pepcid Ac Maximum Strength 20 Mg Tabs (Famotidine) .... One At Bedtime 6)  Tramadol Hcl 50 Mg  Tabs (Tramadol Hcl) .... One To Two By Mouth Every 4-6 Hours Every 6 Hours As Needed 7)  Chlorpheniramine Maleate 4 Mg Tabs (Chlorpheniramine Maleate) .... One  Every 6 Hours As Needed For Tickle For Tickle Sneezing Itching or Drainage. 8)  Diphenhydramine Hcl 50 Mg Tabs (Diphenhydramine Hcl) .Marland Kitchen.. 1 At Bedtime  Allergies (verified): 1)  ! Morphine  Past History:  Past Medical History: Pulmonary embolism 1967 PUD sp perf 1967 Prostate ca 2003 Depression 1999 GERD with Barretts..................................Marland KitchenBuccini Hyperlipidemia Chronic cough...........................................Marland KitchenWert     - Onset Oct 2010 COPD - GOLD II       - PFT's 06/12/09 FEV1 2.25 (65%) ratio 62 and no better with B2  SPN RMl      - CT Chest 11/05/2009 > tickle file for one year f/u  Vital Signs:  Patient profile:   65 year old male Weight:      234 pounds O2 Sat:      97 % on Room air Temp:     97.8 degrees F oral Pulse rate:   66 / minute BP sitting:   116 / 86  (left arm)  Vitals Entered By: Vernie Murders (Jan 08, 2010 10:01 AM)  O2 Flow:  Room air  Physical Exam  Additional Exam:  wt 252 > 234 Jan 08, 2010  amb anxious wm nad mod hoarseness  HEENT: nl dentition, turbinates, and orophanx. Nl external ear canals without cough reflex NECK :  without JVD/Nodes/TM/ nl carotid upstrokes bilaterally LUNGS: no acc muscle use, clear to A and P bilaterally without cough on insp  or exp maneuvers CV:  RRR  no s3 or murmur or increase in P2, no edema  ABD:  soft and nontender with nl excursion in the supine position. No bruits or organomegaly, bowel sounds nl MS:  warm without deformities, calf tenderness, cyanosis or clubbing SKIN: warm and dry without lesions   NEURO:  alert, approp, no deficits     Impression & Recommendations:  Problem # 1:  COUGH (ICD-786.2)   Classic Upper airway cough syndrome, so named because it's frequently impossible to sort out how much is  CR/sinusitis with freq throat clearing (which can be related to primary GERD)   vs  causing  secondary extra esophageal GERD from wide swings in gastric pressure that occur with throat  clearing, promoting self use of mint and menthol lozenges that reduce the lower esophageal sphincter tone and exacerbate the problem further These are the same pts who not infrequently have failed to tolerate ace inhibitors,  dry powder inhalers or biphosphonates or report having reflux symptoms that don't respond to standard doses of PPI  He has Barrett's and much better with aggressive diet/ ppi/h2 rx so this is the most likely cause for the cough. Of the three most common causes of chronic cough, only one (GERD)  can actually cause the other two and perpetuate the cylce of cough inducing airway trauma, inflammation, heightened sensitivity to reflux which is prompted by the cough itself via a cyclical mechanism.  This may partially respond to steroids and look like asthma and post nasal drainage but never erradicated completely unless the cough and the secondary reflux are eliminated, preferably both at the same time.   Each maintenance medication was reviewed in detail including most importantly the difference between maintenance prns and under what circumstances the prns are to be used.  In addition, these two groups (for which the patient should keep up with refills) were distinguished from a third group :  meds that are used only short term with the intent to complete a course of therapy and then not refill them.  The med list was then fully reconciled and reorganized to reflect this important distinction.   Orders: Est. Patient Level IV (16109)  Problem # 2:  COPD UNSPECIFIED (ICD-496) GOLD II, not limiting, no need for rx.  Problem # 3:  PULMONARY NODULE (ICD-518.89)  CT from 11/05/2009 reviewed and discussed with pt, rec f/u ct 10/2010 (placed reminder in tickle file)  Orders: Est. Patient Level IV (60454)  Medications Added to Medication List This Visit: 1)  Diphenhydramine Hcl 50 Mg Tabs (Diphenhydramine hcl) .Marland Kitchen.. 1 at bedtime  Patient Instructions: 1)  Take delsym two tsp every 12  hours  to suppress the urge to cough. 2)  Change Nexium 40 mg before bfast and pepcid 20 mg at bedtime 3)  for drainage use chlortrimeton 4 mg every 6 hours 4)  If your comes back, return to office

## 2010-09-23 NOTE — Progress Notes (Signed)
Summary: pt not better  Phone Note Call from Patient Call back at Home Phone [phone]   Caller: Sloan Leiter Call For: wert Reason for Call: Talk to Nurse Summary of Call: pt not getting any better w. meds.  Last time he improved quicker.  Pt's wife and pt concerned about this.  Please advise. Initial call taken by: Eugene Gavia,  April 02, 2010 9:28 AM  Follow-up for Phone Call        Pt c/o increased cough, with thick clear mucus. Pt states the cough is worse than last OV. I scheduled pt to be seen today @ 12noon. Zackery Barefoot CMA  April 02, 2010 9:44 AM

## 2010-09-25 NOTE — Assessment & Plan Note (Signed)
Summary: Pulmonary/ ext f/u ov for chronic cough   Copy to:  Dr. Blair Heys Primary Provider/Referring Provider:  Dr. Blair Heys  CC:  Cough- some better.  History of Present Illness: 65  yowm quit smoking 1988 with typical smoker's cough resolved and lifelong issues with fall sneezing and coughing.  December 12, 2009 cc cough onset Oct 2010 with sneezing assoc with tickle in throat and urge to clear much worse in evening before at bedtime best when wake up in am and non productive.  Prednisone not helping  Kozlow, ENT eval no benefit per pt.  Treated aggressively for acid reflux but not for nonacid issues (still taking fish oil daily).  rec diet, 1st gen antihistamine and dex am pepcid hs and 6 days only of prednisone.  advair / dulra/ alb no better  Jan 08, 2010 4 wk followup.  Pt states that his cough has improved by about 90%.  Still has only occ cough that's dry.  No new complaints. loosing wt voluntarily, changed diet quite a bit. no sob.  rec Take delsym two tsp every 12 hours  to suppress the urge to cough. Change Nexium 40 mg before bfast and pepcid 20 mg at bedtime for drainage use chlortrimeton 4 mg every 6 hours > much better  March 26, 2010 Acute visit.  Pt c/o cough x 2 -3 wks- prod with clear sputum.  He also c/o frequent throat clearing.   rec pred/ cough suppression  April 02, 2010 Acute visit.  Pt states that cough is worse x 2 days- still prod with clear sputum.  He states wheezing worse also- notices this with coughing spells.  Slt improvement with high dose prednisone.  In retrospect maybe better with  dulera than anything else he's tried. rec restart symbicort, continue rx for GERD  see page 2     May 07, 2010 4 wk followup.  Pt states that cough is much better.  He states wheezing has resolved.  He has noticed that his cough only occurs after eating greasy foods so he has tried to avoid these. no longer using symbicort.    attributes improvement with  pepcid and otc "prelief" working better on his HB than dexilant. rec no change in rx  July 09, 2010 fine x 2 months now acute ov sick for a week with cough acute onset minimal clear and better p delsym and tramadol, has not tried back on symbicort.  no purulent sputum, mild increase sob.   August 05, 2010 --Presents for an acute office viist. Complains of productive cough with clear mucus, some wheezing, increased SOB x2weeks. Took chlor tab and pepcid. Having breakthrough reflux despite pepcid. Uses tramadol for cough -it helps. Took prednisone 1 month ago with some help but never went totally away. Delsym 2 tsp two times a day for 1 week then as needed for cough  Use Clortrimeton 4mg  every 4hr for drainage, throat tickle.  Dexilant 60mg  once daily until samples are done, then begin Prilosec 20mg  once daily before meal-this is over the counter,  Pepicd 20mg  at bedtime  Tramadol 50mg  every 4hrs as needed breakthrough coughing.  Prednisone taper over next week.   September 08, 2010 ov Cough- some better controls it with jolly ranchers ,  not as bad at night.  no excess or purulent sputum production. Pt denies any significant sore throat, dysphagia, itching, sneezing,  nasal congestion or excess secretions,  fever, chills, sweats, unintended wt loss, pleuritic or exertional cp, hempoptysis,  change in activity tolerance  orthopnea pnd or leg swelling Pt also denies any obvious fluctuation in symptoms with weather or environmental change or other alleviating or aggravating factors.  not convinced symbicort helped.     Current Medications (verified): 1)  Melatonin 3 Mg Tabs (Melatonin) .Marland Kitchen.. 1 At Bedtime As Needed 2)  Chlor-Trimeton 4 Mg Tabs (Chlorpheniramine Maleate) .... One Every 6 Hours As Needed For Drainage 3)  Omeprazole 40 Mg Cpdr (Omeprazole) .Marland Kitchen.. 1 Once Daily 4)  Protonix 40 Mg Tbec (Pantoprazole Sodium) .Marland Kitchen.. 1 Once Daily 5)  Delsym 30 Mg/29ml Lqcr (Dextromethorphan Polistirex) .... Per  Bottle Directions As Needed  Allergies (verified): 1)  ! Morphine  Past History:  Past Medical History: Pulmonary embolism 1967 PUD sp perf 1967 Prostate ca 2003 Depression 1999 GERD with Barretts...................................Marland KitchenBuccini     - EGD  2009 Pos > EGD  04/18/10 :  "ok" per verbal report Hyperlipidemia Chronic cough.............................................Marland KitchenWert     - Onset Oct 2010, better after prednisone short course 12/2009 COPD - GOLD II       - PFT's 06/12/09 FEV1 2.25 (65%) ratio 62 and no better with B2  SPN RMl      - CT Chest 11/05/2009 > scheduled f/u 11/06/2010  Vital Signs:  Patient profile:   65 year old male Weight:      245 pounds O2 Sat:      96 % on Room air Temp:     97.5 degrees F oral Pulse rate:   72 / minute BP sitting:   112 / 72  (left arm)  Vitals Entered By: Vernie Murders (September 08, 2010 11:12 AM)  O2 Flow:  Room air CC: Cough- some better   Physical Exam  Additional Exam:  wt  234 Jan 08, 2010 > 234 March 26, 2010  > 233 April 02, 2010 > 235 May 07, 2010 >  245 September 08, 2010  amb somber wm nad minimal  hoarseness  HEENT: nl dentition, turbinates, and orophanx. Nl external ear canals without cough reflex NECK :  without JVD/Nodes/TM/ nl carotid upstrokes bilaterally LUNGS: no acc muscle use,  insp and exp rhonchi with end exp cough CV:  RRR  no s3 or murmur or increase in P2, no edema  ABD:  soft and nontender with nl excursion in the supine position. No bruits or organomegaly, bowel sounds nl MS:  warm without deformities, calf tenderness, cyanosis or clubbing      Impression & Recommendations:  Problem # 1:  COUGH (ICD-786.2)  The most common causes of chronic cough in immunocompetent adults include: upper airway cough syndrome (UACS), previously referred to as postnasal drip syndrome,  caused by variety of rhinosinus conditions; (2) asthma; (3) GERD; (4) chronic bronchitis from cigarette smoking or other  inhaled environmental irritants; (5) nonasthmatic eosinophilic bronchitis; and (6) bronchiectasis. These conditions, singly or in combination, have accounted for up to 94% of the causes of chronic cough in prospective studies.   This is most c/w  Classic Upper airway cough syndrome, so named because it's frequently impossible to sort out how much is  CR/sinusitis with freq throat clearing (which can be related to primary GERD)   vs  causing  secondary extra esophageal GERD from wide swings in gastric pressure that occur with throat clearing, promoting self use of mint and menthol lozenges that reduce the lower esophageal sphincter tone and exacerbate the problem further These are the same pts who not infrequently have failed to tolerate ace inhibitors,  dry powder inhalers or biphosphonates or report having reflux symptoms that don't respond to standard doses of PPI  Next step is add H1 and H2 blockers at bedtime per guidlelines I had an extended discussion with the patient today lasting 15 to 20 minutes of a 25 minute visit on the following issues: NB the  ramp to expected improvement (and for that matter, worsening, if a chronic effective medication is stopped)  can be measured in weeks, not days, a common misconception because this is not Heartburn with no immediate cause and effect relationship so that response to therapy or lack thereof can be very difficult to assess.   See instructions for specific recommendations  next step is sinus ct    Orders: Est. Patient Level IV (04540)  Problem # 2:  PULMONARY NODULE (ICD-518.89)  needs comparison ct @ 1 year due 11/06/2010  Medications Added to Medication List This Visit: 1)  Omeprazole 40 Mg Cpdr (Omeprazole) .... Take one pill 30 min before supper 2)  Omeprazole 40 Mg Cpdr (Omeprazole) .Marland Kitchen.. 1 once daily 3)  Protonix 40 Mg Tbec (Pantoprazole sodium) .... Take  one 30-60 min before first meal of the day 4)  Pepcid 20 Mg Tabs (Famotidine) .... Take  one by mouth at bedtime 5)  Protonix 40 Mg Tbec (Pantoprazole sodium) .Marland Kitchen.. 1 once daily 6)  Chlor-trimeton 4 Mg Tabs (Chlorpheniramine maleate) .... One at  bedtime and  every 6 hours as needed for drainage 7)  Delsym 30 Mg/68ml Lqcr (Dextromethorphan polistirex) .... Per bottle directions as needed  Other Orders: Misc. Referral (Misc. Ref)  Patient Instructions: 1)  See Patient Care Coordinator before leaving for scheduling ct of chest and sinus 11/06/10 2)  Schedule a follow up office visit for day after the scan sooner if not better to your satisfaction on: 3)  Pepcid 20 mg one at bedtime and time your omeprazole and protonix and take chlortrimeton automatically at bedtime

## 2010-09-25 NOTE — Assessment & Plan Note (Signed)
Summary: Acute NP office visit - COPD   Copy to:  Dr. Blair Heys Primary /Referring :  Dr. Blair Heys  CC:  prod cough with clear mucus, some wheezing, and increased SOB x2weeks - denies f/c/s.  states is worse at night..  History of Present Illness: 56  yowm quit smoking Feb 1988 with typical smoker's cough resolved and lifelong issues with fall sneezing and coughing.  December 12, 2009 cc cough onset Oct 2010 with sneezing assoc with tickle in throat and urge to clear much worse in evening before at bedtime best when wake up in am and non productive.  Prednisone not helping  Kozlow, ENT eval no benefit per pt.  Treated aggressively for acid reflux but not for nonacid issues (still taking fish oil daily).  rec diet, 1st gen antihistamine and dex am pepcid hs and 6 days only of prednisone.  advair / dulra/ alb no better  Jan 08, 2010 4 wk followup.  Pt states that his cough has improved by about 90%.  Still has only occ cough that's dry.  No new complaints. loosing wt voluntarily, changed diet quite a bit. no sob.  rec Take delsym two tsp every 12 hours  to suppress the urge to cough. Change Nexium 40 mg before bfast and pepcid 20 mg at bedtime for drainage use chlortrimeton 4 mg every 6 hours > much better  March 26, 2010 Acute visit.  Pt c/o cough x 2 -3 wks- prod with clear sputum.  He also c/o frequent throat clearing.   rec pred/ cough suppression  April 02, 2010 Acute visit.  Pt states that cough is worse x 2 days- still prod with clear sputum.  He states wheezing worse also- notices this with coughing spells.  Slt improvement with high dose prednisone.  In retrospect maybe better with  dulera than anything else he's tried. rec restart symbicort, continue rx for GERD  see page 2     May 07, 2010 4 wk followup.  Pt states that cough is much better.  He states wheezing has resolved.  He has noticed that his cough only occurs after eating greasy foods so he has  tried to avoid these. no longer using symbicort.    attributes improvement with pepcid and otc "prelief" working better on his HB than dexilant. rec no change in rx  July 09, 2010 fine x 2 months now acute ov sick for a week with cough acute onset minimal clear and better p delsym and tramadol, has not tried back on symbicort.  no purulent sputum, mild increase sob.  Pt denies any significant sore throat, dysphagia, itching, sneezing,  nasal congestion or excess secretions,  fever, chills, sweats, unintended wt loss, pleuritic or exertional cp, hempoptysis,  orthopnea pnd or leg swelling.  Pt also denies any obvious fluctuation in symptoms with weather or environmental change or other alleviating or aggravating factors.    August 05, 2010 --Presents for an acute office viist. Complains of productive cough with clear mucus, some wheezing, increased SOB x2weeks. Took chlor tab and pepcid. Having breakthrough reflux despite pepcid. Uses tramadol for cough -it helps. Took prednisone 1 month ago with some help but never went totally away. Denies chest pain, dyspnea, orthopnea, hemoptysis, fever, n/v/d, edema, headache,recent travel or antibiotics, discolored mucus.     Preventive Screening-Counseling & Management  Alcohol-Tobacco     Smoking Status: quit  Medications Prior to Update: 1)  Multivitamins  Tabs (Multiple Vitamin) .Marland Kitchen.. 1 Once Daily 2)  Pepcid Ac Maximum Strength 20 Mg Tabs (Famotidine) .... One At Breakfast, One At [facility] and One Bedtime 3)  Prelief 340 (65-50) Mg (Ca-P) Tabs (Calcium Glycerophosphate) .... 2 Before Each Meal 4)  Melatonin 3 Mg Tabs (Melatonin) .Marland Kitchen.. 1 At Bedtime As Needed 5)  Tramadol Hcl 50 Mg  Tabs (Tramadol Hcl) .... One To Two By Mouth Every 4-6 Hours If Needed 6)  Chlor-Trimeton 4 Mg Tabs (Chlorpheniramine Maleate) .... One Every 6 Hours As Needed For Drainage 7)  Prednisone 10 Mg  Tabs (Prednisone) .... 4 Each Am X 2days, 2x2days, 1x2days and Stop  Current  Medications (verified): 1)  Multivitamins  Tabs (Multiple Vitamin) .Marland Kitchen.. 1 Once Daily 2)  Pepcid Ac Maximum Strength 20 Mg Tabs (Famotidine) .... One At Breakfast, One At [facility] and One Bedtime 3)  Prelief 340 (65-50) Mg (Ca-P) Tabs (Calcium Glycerophosphate) .... 2 Before Each Meal 4)  Melatonin 3 Mg Tabs (Melatonin) .Marland Kitchen.. 1 At Bedtime As Needed 5)  Chlor-Trimeton 4 Mg Tabs (Chlorpheniramine Maleate) .... One Every 6 Hours As Needed For Drainage  Allergies (verified): 1)  ! Morphine  Past History:  Past Medical History: Last updated: 07/09/2010 Pulmonary embolism 1967 PUD sp perf 1967 Prostate ca 2003 Depression 1999 GERD with Barretts..................................Marland KitchenBuccini     - EGD  2009 Pos > EGD  04/18/10 :  "ok" per verbal report Hyperlipidemia Chronic cough.............................................Marland KitchenWert     - Onset Oct 2010, better after prednisone short course 12/2009 COPD - GOLD II       - PFT's 06/12/09 FEV1 2.25 (65%) ratio 62 and no better with B2  SPN RMl      - CT Chest 11/05/2009 > tickle file for one year f/u  Family History: Last updated: 12/12/2009 Bladder CA- Mother Stroke- Mother Emphysema- "everyone has"- (none smokers per pt) Asthma- Mother  Social History: Last updated: 12/12/2009 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  Quit in 1988.  Smoked up to 1 ppd x 20 yrs No ETOH since 2010  Risk Factors: Smoking Status: quit (08/05/2010)  Social History: Smoking Status:  quit  Review of Systems      See HPI  Vital Signs:  Patient profile:   65 year old male Height:      73 inches Weight:      242.38 pounds BMI:     32.09 O2 Sat:      95 % on Room air Temp:     97.8 degrees F oral Pulse rate:   69 / minute BP sitting:   136 / 80  (left arm) Cuff size:   regular  Vitals Entered By: Boone Master CNA/MA (August 05, 2010 11:25 AM)  O2 Flow:  Room air CC: prod cough with clear mucus, some wheezing, increased SOB x2weeks - denies  f/c/s.  states is worse at night. Is Patient Diabetic? No Comments Medications reviewed with patient Daytime contact number verified with patient. Boone Master CNA/MA  August 05, 2010 11:26 AM    Physical Exam  Additional Exam:  wt  234 Jan 08, 2010 > 234 March 26, 2010  > 233 April 02, 2010 > 235 May 07, 2010 > 248 July 09, 2010 >242 08/05/10 amb anxious wm nad minimal  hoarseness  HEENT: nl dentition, turbinates, and orophanx. Nl external ear canals without cough reflex NECK :  without JVD/Nodes/TM/ nl carotid upstrokes bilaterally LUNGS: no acc muscle use,  insp and exp rhonchi with end exp cough CV:  RRR  no s3 or murmur or  increase in P2, no edema  ABD:  soft and nontender with nl excursion in the supine position. No bruits or organomegaly, bowel sounds nl MS:  warm without deformities, calf tenderness, cyanosis or clubbing      Impression & Recommendations:  Problem # 1:  COUGH (ICD-786.2)  Cyclical cough suspect is multifactoral in nature.  Plan:  Delsym 2 tsp two times a day for 1 week then as needed for cough  Use Clortrimeton 4mg  every 4hr for drainage, throat tickle.  Dexilant 60mg  once daily until samples are done, then begin Prilosec 20mg  once daily before meal-this is over the counter,  Pepicd 20mg  at bedtime  Tramadol 50mg  every 4hrs as needed breakthrough coughing.  GOAL IS TO STOP COUGHING. NO MINTS USE WATER , SUGARLESS CANDY TO HELP SOOTHE THROAT.  Prednisone taper over next week.  .follow up Dr. Sherene Sires in 4 weeks  Please contact office for sooner follow up if symptoms do not improve or worsen   Orders: Est. Patient Level IV (33295)  Medications Added to Medication List This Visit: 1)  Prilosec 20 Mg Cpdr (Omeprazole) .Marland Kitchen.. 1 by mouth once daily 2)  Prednisone 10 Mg Tabs (Prednisone) .... 4 tabs for 2 days, then 3 tabs for 2 days, 2 tabs for 2 days, then 1 tab for 2 days, then stop  Complete Medication List: 1)  Multivitamins Tabs (Multiple  vitamin) .Marland Kitchen.. 1 once daily 2)  Pepcid Ac Maximum Strength 20 Mg Tabs (Famotidine) .... One at breakfast, one at lunch and one bedtime 3)  Prelief 340 (65-50) Mg (ca-p) Tabs (Calcium glycerophosphate) .... 2 before each meal 4)  Melatonin 3 Mg Tabs (Melatonin) .Marland Kitchen.. 1 at bedtime as needed 5)  Chlor-trimeton 4 Mg Tabs (Chlorpheniramine maleate) .... One every 6 hours as needed for drainage 6)  Prilosec 20 Mg Cpdr (Omeprazole) .Marland Kitchen.. 1 by mouth once daily 7)  Prednisone 10 Mg Tabs (Prednisone) .... 4 tabs for 2 days, then 3 tabs for 2 days, 2 tabs for 2 days, then 1 tab for 2 days, then stop  Patient Instructions: 1)  Delsym 2 tsp two times a day for 1 week then as needed for cough  2)  Use Clortrimeton 4mg  every 4hr for drainage, throat tickle.  3)  Dexilant 60mg  once daily until samples are done, then begin Prilosec 20mg  once daily before meal-this is over the counter,  4)  Pepicd 20mg  at bedtime  5)  Tramadol 50mg  every 4hrs as needed breakthrough coughing.  6)  GOAL IS TO STOP COUGHING. NO MINTS USE WATER , SUGARLESS CANDY TO HELP SOOTHE THROAT.  7)  Prednisone taper over next week.  8)  .follow up Dr. Sherene Sires in 4 weeks  9)  Please contact office for sooner follow up if symptoms do not improve or worsen  Prescriptions: PREDNISONE 10 MG TABS (PREDNISONE) 4 tabs for 2 days, then 3 tabs for 2 days, 2 tabs for 2 days, then 1 tab for 2 days, then stop  #20 x 0   Entered and Authorized by:   Rubye Oaks NP   Signed by:   Tammy Parrett NP on 08/05/2010   Method used:   Electronically to        [id number] (retail)       [address]       [place], [place]  [address]       Ph: [phone] or [phone]       Fax: [phone]   RxID:   [id number]  Immunization History:  Influenza Immunization History:    Influenza:  historical (05/24/2009)

## 2010-09-25 NOTE — Progress Notes (Signed)
Summary: cough  Phone Note Call from Patient Call back at Home Phone [phone]   Caller: Patient Call For: wert Reason for Call: Talk to Nurse Summary of Call: Patient calling productive cough with clear mucus, some wheezing.  Patient recently finished rx of prednisone and abx.  Cough had gotten better while taking rx's, but cough has gradually came back.  [facility] Initial call taken by: Lehman Prom,  August 19, 2010 10:42 AM  Follow-up for Phone Call        called spoke with patient who saw TP on 12.13.11 and was given dexilant/prevacid, chlor-tabs and a pred taper.  pt states that as soon as he finished the taper his cough returned and has worsened since: c/o coughing until he is "blue in the face," prod cough with clear mucus, wheezing.  denies f/c/s.  MW is out of the office.  will forward to doc of the day.  allergies: morphine.  [facility]   Follow-up by: Boone Master CNA/MA,  August 19, 2010 2:21 PM  Additional Follow-up for Phone Call Additional follow up Details #1::        per SN- give pred dose pak 5 mg, 6 day pak as directed, also tussionex 4oz 1 tsp by mouth every 12 hours as needed and rov with MW. Carron Curie CMA  August 19, 2010 4:32 PM  rx sent in pt aware of recs.Carron Curie CMA  August 19, 2010 4:36 PM     New/Updated Medications: PREDNISONE (PAK) 5 MG TABS (PREDNISONE) 6 day pack as directed TUSSIONEX PENNKINETIC ER 10-8 MG/5ML LQCR (HYDROCOD POLST-CHLORPHEN POLST) 1 teaspoon by mouth every 12 hours as needed for cough Prescriptions: TUSSIONEX PENNKINETIC ER 10-8 MG/5ML LQCR (HYDROCOD POLST-CHLORPHEN POLST) 1 teaspoon by mouth every 12 hours as needed for cough  #4 oz x 0   Entered by:   Carron Curie CMA   Authorized by:   Michele Mcalpine MD   Signed by:   Carron Curie CMA on 08/19/2010   Method used:   Telephoned to ...       [id number] (retail)       [address]       [place], [place]  [address]       Ph: [phone] or [phone]       Fax: [phone]   RxID:   [id number] PREDNISONE (PAK) 5 MG TABS (PREDNISONE) 6 day pack as directed  #1 pack x 0   Entered by:   Carron Curie CMA   Authorized by:   Michele Mcalpine MD   Signed by:   Carron Curie CMA on 08/19/2010   Method used:   Telephoned to ...       [id number] (retail)       [address]       [place], [place]  [address]       Ph: [phone] or [phone]       Fax: [phone]   RxID:   [id number]

## 2010-10-30 ENCOUNTER — Encounter: Payer: Self-pay | Admitting: Adult Health

## 2010-10-30 ENCOUNTER — Telehealth (INDEPENDENT_AMBULATORY_CARE_PROVIDER_SITE_OTHER): Payer: Self-pay | Admitting: *Deleted

## 2010-10-30 ENCOUNTER — Ambulatory Visit (INDEPENDENT_AMBULATORY_CARE_PROVIDER_SITE_OTHER): Payer: Medicare Other | Admitting: Adult Health

## 2010-10-30 DIAGNOSIS — J449 Chronic obstructive pulmonary disease, unspecified: Secondary | ICD-10-CM

## 2010-11-04 ENCOUNTER — Ambulatory Visit: Payer: Self-pay | Admitting: Adult Health

## 2010-11-04 NOTE — Progress Notes (Signed)
Summary: wheezing/cough  Phone Note Call from Patient Call back at Home Phone [phone]   Caller: Patient Call For: wert Reason for Call: Talk to Nurse Summary of Call: Patient calling stating that he is wheezing and coughing really bad.  He was requesting an appt with Dr. Sherene Sires, no available appts, offered Tammy he refused.  Asking for nurse to call him. Initial call taken by: Lehman Prom,  October 30, 2010 8:12 AM  Follow-up for Phone Call        Pt c/o recurring cough and chest congestion. He will see TP today at 2:45pm.Lori [facility]  October 30, 2010 9:09 AM

## 2010-11-04 NOTE — Assessment & Plan Note (Signed)
Summary: Acute NP office visit - COPD   Copy to:  Dr. Blair Heys Primary Provider/Referring Provider:  Dr. Blair Heys  CC:  prod cough with clear mucus, wheezing, and increased SOB x3days.  History of Present Illness: 51  yowm quit smoking 1988 with typical smoker's cough resolved and lifelong issues with fall sneezing and coughing.  December 12, 2009 cc cough onset Oct 2010 with sneezing assoc with tickle in throat and urge to clear much worse in evening before at bedtime best when wake up in am and non productive.  Prednisone not helping  Kozlow, ENT eval no benefit per pt.  Treated aggressively for acid reflux but not for nonacid issues (still taking fish oil daily).  rec diet, 1st gen antihistamine and dex am pepcid hs and 6 days only of prednisone.  advair / dulra/ alb no better  Jan 08, 2010 4 wk followup.  Pt states that his cough has improved by about 90%.  Still has only occ cough that's dry.  No new complaints. loosing wt voluntarily, changed diet quite a bit. no sob.  rec Take delsym two tsp every 12 hours  to suppress the urge to cough. Change Nexium 40 mg before bfast and pepcid 20 mg at bedtime for drainage use chlortrimeton 4 mg every 6 hours > much better  March 26, 2010 Acute visit.  Pt c/o cough x 2 -3 wks- prod with clear sputum.  He also c/o frequent throat clearing.   rec pred/ cough suppression  April 02, 2010 Acute visit.  Pt states that cough is worse x 2 days- still prod with clear sputum.  He states wheezing worse also- notices this with coughing spells.  Slt improvement with high dose prednisone.  In retrospect maybe better with  dulera than anything else he's tried. rec restart symbicort, continue rx for GERD  see page 2     May 07, 2010 4 wk followup.  Pt states that cough is much better.  He states wheezing has resolved.  He has noticed that his cough only occurs after eating greasy foods so he has tried to avoid these. no longer using symbicort.     attributes improvement with pepcid and otc "prelief" working better on his HB than dexilant. rec no change in rx  July 09, 2010 fine x 2 months now acute ov sick for a week with cough acute onset minimal clear and better p delsym and tramadol, has not tried back on symbicort.  no purulent sputum, mild increase sob.   August 05, 2010 --Presents for an acute office viist. Complains of productive cough with clear mucus, some wheezing, increased SOB x2weeks. Took chlor tab and pepcid. Having breakthrough reflux despite pepcid. Uses tramadol for cough -it helps. Took prednisone 1 month ago with some help but never went totally away. Delsym 2 tsp two times a day for 1 week then as needed for cough  Use Clortrimeton 4mg  every 4hr for drainage, throat tickle.  Dexilant 60mg  once daily until samples are done, then begin Prilosec 20mg  once daily before meal-this is over the counter,  Pepicd 20mg  at bedtime  Tramadol 50mg  every 4hrs as needed breakthrough coughing.  Prednisone taper over next week.   September 08, 2010 ov Cough- some better controls it with jolly ranchers ,  not as bad at night.  >>pepcid and chlor tab added  October 30, 2010 --Presents for an acute office viist. Complains of prod cough with clear mucus, wheezing, increased SOB x3days .  Cough is rattling in chest , worse in am. coughed up thick yellow mucus this am. Low grade temp today. Wife is sick in hospital. Denies chest pain,  orthopnea, hemoptysis, fever, n/v/d, edema. Mucinex is not helping.   Medications Prior to Update: 1)  Melatonin 3 Mg Tabs (Melatonin) .Marland Kitchen.. 1 At Bedtime As Needed 2)  Omeprazole 40 Mg Cpdr (Omeprazole) .... Take One Pill 30 Min Before Supper 3)  Protonix 40 Mg Tbec (Pantoprazole Sodium) .... Take  One 30-60 Min Before First Meal of The Day 4)  Pepcid 20 Mg Tabs (Famotidine) .... Take One By Mouth At Bedtime 5)  Chlor-Trimeton 4 Mg Tabs (Chlorpheniramine Maleate) .... One At  Bedtime and  Every 6 Hours As  Needed For Drainage 6)  Delsym 30 Mg/4ml Lqcr (Dextromethorphan Polistirex) .... Per Bottle Directions As Needed  Current Medications (verified): 1)  Melatonin 3 Mg Tabs (Melatonin) .Marland Kitchen.. 1 At Bedtime As Needed 2)  Omeprazole 40 Mg Cpdr (Omeprazole) .... Take One Pill 30 Min Before Supper 3)  Protonix 40 Mg Tbec (Pantoprazole Sodium) .... Take  One 30-60 Min Before First Meal of The Day 4)  Pepcid 20 Mg Tabs (Famotidine) .... Take One By Mouth At Bedtime 5)  Chlor-Trimeton 4 Mg Tabs (Chlorpheniramine Maleate) .... One At  Bedtime and  Every 6 Hours As Needed For Drainage 6)  Delsym 30 Mg/73ml Lqcr (Dextromethorphan Polistirex) .... Per Bottle Directions As Needed  Allergies (verified): 1)  ! Morphine  Past History:  Past Medical History: Last updated: 09/08/2010 Pulmonary embolism 1967 PUD sp perf 1967 Prostate ca 2003 Depression 1999 GERD with Barretts...................................Marland KitchenBuccini     - EGD  2009 Pos > EGD  04/18/10 :  "ok" per verbal report Hyperlipidemia Chronic cough.............................................Marland KitchenWert     - Onset Oct 2010, better after prednisone short course 12/2009 COPD - GOLD II       - PFT's 06/12/09 FEV1 2.25 (65%) ratio 62 and no better with B2  SPN RMl      - CT Chest 11/05/2009 > scheduled f/u 11/06/2010  Past Surgical History: Last updated: 12/12/2009 Prostatectomy 1999  Family History: Last updated: 12/12/2009 Bladder CA- Mother Stroke- Mother Emphysema- "everyone has"- (none smokers per pt) Asthma- Mother  Social History: Last updated: 12/12/2009 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  Quit in 1988.  Smoked up to 1 ppd x 20 yrs No ETOH since 2010  Risk Factors: Smoking Status: quit (08/05/2010)  Review of Systems      See HPI  Vital Signs:  Patient profile:   65 year old male Height:      73 inches Weight:      246.38 pounds BMI:     32.62 O2 Sat:      94 % on Room air Temp:     99.3 degrees F oral Pulse  rate:   77 / minute BP sitting:   110 / 82  (left arm) Cuff size:   regular  Vitals Entered By: Boone Master CNA/MA (October 30, 2010 3:13 PM)  O2 Flow:  Room air CC: prod cough with clear mucus, wheezing, increased SOB x3days Is Patient Diabetic? No Comments Medications reviewed with patient Daytime contact number verified with patient. Boone Master CNA/MA  October 30, 2010 3:13 PM    Physical Exam  Additional Exam:  wt  234 Jan 08, 2010 > 234 March 26, 2010  > 233 April 02, 2010 > 235 May 07, 2010 >  245 September 08, 2010 >>246  October 30, 2010 amb somber wm nad minimal  hoarseness  HEENT: nl dentition, turbinates, and orophanx. Nl external ear canals without cough reflex NECK :  without JVD/Nodes/TM/ nl carotid upstrokes bilaterally LUNGS: no acc muscle use,  exp rhonchi -few scattered  CV:  RRR  no s3 or murmur or increase in P2, no edema  ABD:  soft and nontender with nl excursion in the supine position. No bruits or organomegaly, bowel sounds nl MS:  warm without deformities, calf tenderness, cyanosis or clubbing      Impression & Recommendations:  Problem # 1:  COPD UNSPECIFIED (ICD-496) Exacerbation  xopenex neb in office  Plan :  Augmentin 875mg  two times a day for 7 days Mucinex  two times a day as needed cough/congestion  Prednisone taper over next week.  Fluids and rest  Please contact office for sooner follow up if symptoms do not improve or worsen   Medications Added to Medication List This Visit: 1)  Augmentin 875-125 Mg Tabs (Amoxicillin-pot clavulanate) .Marland Kitchen.. 1 by mouth two times a day 2)  Prednisone 10 Mg Tabs (Prednisone) .... 4 tabs for 2 days, then 3 tabs for 2 days, 2 tabs for 2 days, then 1 tab for 2 days, then stop  Other Orders: Est. Patient Level IV (81191)  Patient Instructions: 1)  Augmentin 875mg  two times a day for 7 days 2)  Mucinex  two times a day as needed cough/congestion  3)  Prednisone taper over next week.  4)  Fluids and rest   5)  Please contact office for sooner follow up if symptoms do not improve or worsen  6)  follow up next week as planned and as needed  Prescriptions: PREDNISONE 10 MG TABS (PREDNISONE) 4 tabs for 2 days, then 3 tabs for 2 days, 2 tabs for 2 days, then 1 tab for 2 days, then stop  #20 x 0   Entered and Authorized by:   Rubye Oaks NP   Signed by:   Tammy Parrett NP on 10/30/2010   Method used:   Electronically to        [id number] (retail)       [address]       [place], [place]  [address]       Ph: [phone] or [phone]       Fax: [phone]   RxID:   [id number] AUGMENTIN 875-125 MG TABS (AMOXICILLIN-POT CLAVULANATE) 1 by mouth two times a day  #14 x 0   Entered and Authorized by:   Rubye Oaks NP   Signed by:   Tammy Parrett NP on 10/30/2010   Method used:   Electronically to        [id number] (retail)       [address]       [place], [place]  [address]       Ph: [phone] or [phone]       Fax: [phone]   RxID:   [id number]    Immunization History:  Influenza Immunization History:    Influenza:  historical (10/27/2010)  Appended Document: Orders Update    Clinical Lists Changes  Orders: Added new Service order of Albuterol Sulfate Sol 1mg  unit dose (E8315) - Signed Added new Service order of Nebulizer Tx (17616) - Signed       Medication Administration  Medication # 1:    Medication: Albuterol Sulfate Sol 1mg  unit dose    Diagnosis: COPD UNSPECIFIED (ICD-496)    Dose:  1 vial    Route: inhaled    Exp Date: 11/12    Lot #: [id number]    Mfr: Nephron    Patient tolerated medication without complications    Given by: Zackery Barefoot CMA (October 30, 2010 4:53 PM)  Orders Added: 1)  Albuterol Sulfate Sol 1mg  unit dose [J7613] 2)  Nebulizer Tx [04540]

## 2010-11-06 ENCOUNTER — Ambulatory Visit (INDEPENDENT_AMBULATORY_CARE_PROVIDER_SITE_OTHER)
Admission: RE | Admit: 2010-11-06 | Discharge: 2010-11-06 | Disposition: A | Discharge status: Home / Self Care | Payer: Medicare Other | Source: Ambulatory Visit | Attending: Internal Medicine | Admitting: Internal Medicine

## 2010-11-06 ENCOUNTER — Other Ambulatory Visit: Payer: Self-pay

## 2010-11-06 ENCOUNTER — Encounter: Payer: Self-pay | Admitting: Internal Medicine

## 2010-11-06 DIAGNOSIS — R059 Cough, unspecified: Secondary | ICD-10-CM

## 2010-11-06 DIAGNOSIS — R05 Cough: Secondary | ICD-10-CM

## 2010-11-07 ENCOUNTER — Ambulatory Visit (INDEPENDENT_AMBULATORY_CARE_PROVIDER_SITE_OTHER): Payer: Medicare Other | Admitting: Internal Medicine

## 2010-11-07 ENCOUNTER — Encounter: Payer: Self-pay | Admitting: Internal Medicine

## 2010-11-07 DIAGNOSIS — J449 Chronic obstructive pulmonary disease, unspecified: Secondary | ICD-10-CM

## 2010-11-07 DIAGNOSIS — J019 Acute sinusitis, unspecified: Secondary | ICD-10-CM | POA: Insufficient documentation

## 2010-11-11 NOTE — Assessment & Plan Note (Signed)
Summary: Pulmonary/ ext ov with hfa 75%   Copy to:  Dr. Blair Heys Primary Provider/Referring Provider:  Dr. Blair Heys  CC:  Dyspnea and cough- the same.  History of Present Illness: 21  yowm quit smoking 1988 with typical smoker's cough resolved and lifelong issues with fall sneezing and coughing.  December 12, 2009 cc cough onset Oct 2010 with sneezing assoc with tickle in throat and urge to clear much worse in evening before at bedtime best when wake up in am and non productive.  Prednisone not helping  Kozlow, ENT eval no benefit per pt.  Treated aggressively for acid reflux but not for nonacid issues (still taking fish oil daily).  rec diet, 1st gen antihistamine and dex am pepcid hs and 6 days only of prednisone.  advair / dulra/ alb   April 02, 2010 Acute visit.  Pt states that cough is worse x 2 days- still prod with clear sputum.  He states wheezing worse also- notices this with coughing spells.  Slt improvement with high dose prednisone.  In retrospect maybe better with  dulera than anything else he's tried. rec restart symbicort, continue rx for GERD.     October 30, 2010 -  acute office viist cc  prod cough with clear mucus, wheezing, increased SOB x3days . Cough is rattling in chest , worse in am. coughed up thick yellow mucus this am. Low grade temp    Mucinex is not helping.   Augmentin 875mg  two times a day for 7 days Mucinex  two times a day as needed cough/congestion  Prednisone taper over next week. > sinus and chest ct Pos sinusitis/ ? LL as dz  November 07, 2010 ov Dyspnea and cough- the same  worse when lie down at night with no sign mucus production, mild doe, not using any sinus rx at all x 2 different H1s.  Pt denies any significant sore throat, dysphagia, itching, sneezing,  nasal congestion or excess secretions,  fever, chills, sweats, unintended wt loss, pleuritic or exertional cp, hempoptysis, change in activity tolerance  orthopnea pnd or leg swelling. Pt also  denies any obvious fluctuation in symptoms with weather or environmental change or other alleviating or aggravating factors.                  Current Medications (verified): 1)  Melatonin 3 Mg Tabs (Melatonin) .Marland Kitchen.. 1 At Bedtime As Needed 2)  Omeprazole 40 Mg Cpdr (Omeprazole) .... Take One Pill 30 Min Before Supper 3)  Protonix 40 Mg Tbec (Pantoprazole Sodium) .... Take  One 30-60 Min Before First Meal of The Day 4)  Pepcid 20 Mg Tabs (Famotidine) .... Take One By Mouth At Bedtime 5)  Chlor-Trimeton 4 Mg Tabs (Chlorpheniramine Maleate) .... One At  Bedtime and  Every 6 Hours As Needed For Drainage 6)  Vicks Nyquil Cough 6.25-15 Mg/76ml Liqd (Doxylamine-Dm) .... Take As Directed Per Bottle Directions 7)  Mucinex 600 Mg Xr12h-Tab (Guaifenesin) .Marland Kitchen.. 1 To 2 Every 12 Hours As Needed 8)  Tylenol Pm Extra Strength 500-25 Mg Tabs (Diphenhydramine-Apap (Sleep)) .... Per Bottle Directions As Needed  Allergies (verified): 1)  ! Morphine  Past History:  Past Medical History: Pulmonary embolism 1967 PUD sp perf 1967 Prostate ca 2003 Depression 1999 GERD with Barretts...................................Marland KitchenBuccini     - EGD  2009 Pos > EGD  04/18/10 :  "ok" per verbal report Hyperlipidemia Chronic cough.............................................Marland KitchenWert     - Onset Oct 2010, better after prednisone short course 12/2009     -  Sinus CT 11/06/10 Bilateral maxillary, ethmoid and possibly frontal sinusitis > 21 days augmentin COPD - GOLD II       - PFT's 06/12/09 FEV1 2.25 (65%) ratio 62 and no better with B2  SPN RMl      - CT Chest 11/05/2009 > scheduled f/u 11/06/2010 >  LLLAS Dz  Vital Signs:  Patient profile:   65 year old male Weight:      244 pounds O2 Sat:      96 % on Room air Temp:     98.1 degrees F oral Pulse rate:   66 / minute BP sitting:   104 / 74  (left arm)  Vitals Entered By: Vernie Murders (November 07, 2010 10:14 AM)  O2 Flow:  Room air  Physical Exam  Additional Exam:   wt  234 Jan 08, 2010 >   >  245 September 08, 2010 >>246 October 30, 2010 > 244 November 07, 2010  amb somber wm nad minimal  hoarseness  HEENT: nl dentition, turbinates, and orophanx. Nl external ear canals without cough reflex NECK :  without JVD/Nodes/TM/ nl carotid upstrokes bilaterally LUNGS: no acc muscle use,  insp and  exp rhonchi bilaterally CV:  RRR  no s3 or murmur or increase in P2, no edema  ABD:  soft and nontender with nl excursion in the supine position. No bruits or organomegaly, bowel sounds nl MS:  warm without deformities, calf tenderness, cyanosis or clubbing      Impression & Recommendations:  Problem # 1:  COPD UNSPECIFIED (ICD-496)  GOLD II but very difficult to control all his symptoms  DDX of  difficult airways managment all start with A and  include Adherence, Ace Inhibitors, Acid Reflux, Active Sinus Disease, Alpha 1 Antitripsin deficiency, Anxiety masquerading as Airways dz,  ABPA,  allergy(esp in young), Aspiration (esp in elderly), Adverse effects of DPI,  Active smokers, plus two Bs  = Bronchiectasis and Beta blocker use..and one C= CHF    Acute sinusitis see #2   Adherence:  I spent extra time with the patient today explaining optimal mdi  technique.  This improved from  50-75% with coaching  Acid reflux:  def has it as had barretts. See instructions for specific recommendations   Problem # 2:  SINUSITIS  ACUTE, UNSPECIFIED (ICD-461.9)  I had an extended discussion with the patient today lasting 15 to 20 minutes of a 25 minute visit on the following issues:   If this is not corrected we will not be able to control his lower airway symptoms  The following medications were removed from the medication list:    Vicks Nyquil Cough 6.25-15 Mg/17ml Liqd (Doxylamine-dm) .Marland Kitchen... Take as directed per bottle directions    Augmentin 875-125 Mg Tabs (Amoxicillin-pot clavulanate) .Marland Kitchen... 1 by mouth two times a day    Mucinex 600 Mg Xr12h-tab (Guaifenesin) .Marland Kitchen... 1 to 2 every  12 hours as needed His updated medication list for this problem includes:    Mucinex Dm 30-600 Mg Xr12h-tab (Dextromethorphan-guaifenesin) .Marland Kitchen... 1-2 two every 12 hours    Augmentin 875-125 Mg Tabs (Amoxicillin-pot clavulanate) ..... By mouth twice daily  Orders: Prescription Created Electronically [phone]) Est. Patient Level IV (60454)  Medications Added to Medication List This Visit: 1)  Omeprazole 40 Mg Cpdr (Omeprazole) .... Take one pill 30 min before supper 2)  Vicks Nyquil Cough 6.25-15 Mg/24ml Liqd (Doxylamine-dm) .... Take as directed per bottle directions 3)  Mucinex 600 Mg Xr12h-tab (Guaifenesin) .Marland Kitchen.. 1 to 2  every 12 hours as needed 4)  Tylenol Pm Extra Strength 500-25 Mg Tabs (Diphenhydramine-apap (sleep)) .... Per bottle directions as needed 5)  Dulera 100-5 Mcg/act Aero (Mometasone furo-formoterol fum) .... 2 puffs first thing  in am and 2 puffs again in pm about 12 hours later 6)  Mucinex Dm 30-600 Mg Xr12h-tab (Dextromethorphan-guaifenesin) .Marland Kitchen.. 1-2 two every 12 hours 7)  Tramadol Hcl 50 Mg Tabs (Tramadol hcl) .... One to two by mouth every 4-6 hours 8)  Augmentin 875-125 Mg Tabs (Amoxicillin-pot clavulanate) .... By mouth twice daily  Complete Medication List: 1)  Melatonin 3 Mg Tabs (Melatonin) .Marland Kitchen.. 1 at bedtime as needed 2)  Omeprazole 40 Mg Cpdr (Omeprazole) .... Take one pill 30 min before supper 3)  Protonix 40 Mg Tbec (Pantoprazole sodium) .... Take  one 30-60 min before first meal of the day 4)  Pepcid 20 Mg Tabs (Famotidine) .... Take one by mouth at bedtime 5)  Dulera 100-5 Mcg/act Aero (Mometasone furo-formoterol fum) .... 2 puffs first thing  in am and 2 puffs again in pm about 12 hours later 6)  Mucinex Dm 30-600 Mg Xr12h-tab (Dextromethorphan-guaifenesin) .Marland Kitchen.. 1-2 two every 12 hours 7)  Tramadol Hcl 50 Mg Tabs (Tramadol hcl) .... One to two by mouth every 4-6 hours 8)  Augmentin 875-125 Mg Tabs (Amoxicillin-pot clavulanate) .... By mouth twice daily  Other  Orders: HFA Instruction [phone])  Patient Instructions: 1)  stop chortrimeton and tylenol pm 2)  Prednisone 4 each am x 2days, 2x2days, 1x2days and stop  3)  Augmentin 875 mg twice daily x 14 days 4)  Saline nasal spray  5)  for cough  >> mucinex dm 2  every 12 hours and add tramadol 50 mg up to every 4 hours to suppress the urge to cough. Swallowing water or using ice chips/non mint and menthol containing candies (such as lifesavers or sugarless jolly ranchers) are also effective 6)  for wheeze> dulera 100 2 puffs every 12 hours if needed  7)  Work on inhaler technique:  relax and blow all the way out then take a nice smooth deep breath back in, triggering the inhaler at same time you start breathing in   8)  Please schedule a follow-up appointment in 3 weeks, sooner if needed  Prescriptions: AUGMENTIN 875-125 MG  TABS (AMOXICILLIN-POT CLAVULANATE) By mouth twice daily  #28 x 0   Entered and Authorized by:   Nyoka Cowden MD   Signed by:   Nyoka Cowden MD on 11/07/2010   Method used:   Electronically to        [id number] ([facility]       [address]       [place], [place]  [address]       Ph: [phone] or [phone]       Fax: [phone]   RxID:   [id number] TRAMADOL HCL 50 MG  TABS (TRAMADOL HCL) One to two by mouth every 4-6 hours  #40 x 0   Entered and Authorized by:   Nyoka Cowden MD   Signed by:   Nyoka Cowden MD on 11/07/2010   Method used:   Electronically to        [id number] (retail)       [address]       [place], [place]  [address]       Ph: [phone] or [phone]       Fax: [phone]   RxID:  [id number]  

## 2010-11-11 NOTE — Miscellaneous (Signed)
Summary: Orders Update   Clinical Lists Changes  Orders: Added new Referral order of Radiology Referral (Radiology) - Signed 

## 2010-11-15 ENCOUNTER — Emergency Department (HOSPITAL_COMMUNITY)
Admission: EM | Admit: 2010-11-15 | Discharge: 2010-11-16 | Disposition: A | Discharge status: Home / Self Care | Payer: Medicare Other | Attending: Emergency Medicine | Admitting: Emergency Medicine

## 2010-11-15 ENCOUNTER — Emergency Department (HOSPITAL_COMMUNITY): Payer: Medicare Other

## 2010-11-15 DIAGNOSIS — Z87828 Personal history of other (healed) physical injury and trauma: Secondary | ICD-10-CM | POA: Insufficient documentation

## 2010-11-15 DIAGNOSIS — Z8546 Personal history of malignant neoplasm of prostate: Secondary | ICD-10-CM | POA: Insufficient documentation

## 2010-11-15 DIAGNOSIS — I252 Old myocardial infarction: Secondary | ICD-10-CM | POA: Insufficient documentation

## 2010-11-15 DIAGNOSIS — K5732 Diverticulitis of large intestine without perforation or abscess without bleeding: Secondary | ICD-10-CM | POA: Insufficient documentation

## 2010-11-15 DIAGNOSIS — R509 Fever, unspecified: Secondary | ICD-10-CM | POA: Insufficient documentation

## 2010-11-15 DIAGNOSIS — R1032 Left lower quadrant pain: Secondary | ICD-10-CM | POA: Insufficient documentation

## 2010-11-15 LAB — COMPREHENSIVE METABOLIC PANEL
ALT: 29 U/L (ref 0–53)
AST: 18 U/L (ref 0–37)
Albumin: 3.2 g/dL — ABNORMAL LOW (ref 3.5–5.2)
Alkaline Phosphatase: 73 U/L (ref 39–117)
BUN: 14 mg/dL (ref 6–23)
CO2: 24 mEq/L (ref 19–32)
Calcium: 8.4 mg/dL (ref 8.4–10.5)
Chloride: 105 mEq/L (ref 96–112)
Creatinine, Ser: 0.83 mg/dL (ref 0.4–1.5)
GFR calc Af Amer: >60 mL/min (ref >60)
GFR calc non Af Amer: >60 mL/min (ref >60)
Glucose, Bld: 106 mg/dL — ABNORMAL HIGH (ref 70–99)
Potassium: 3.7 mEq/L (ref 3.5–5.1)
Sodium: 136 mEq/L (ref 135–145)
Total Bilirubin: 1.3 mg/dL — ABNORMAL HIGH (ref 0.3–1.2)
Total Protein: 6.3 g/dL (ref 6.0–8.3)

## 2010-11-15 LAB — URINALYSIS, ROUTINE W REFLEX MICROSCOPIC
Bilirubin Urine: NEGATIVE
Glucose, UA: NEGATIVE mg/dL
Hgb urine dipstick: NEGATIVE
Ketones, ur: NEGATIVE mg/dL
Nitrite: NEGATIVE
Protein, ur: NEGATIVE mg/dL
Specific Gravity, Urine: 1.018 (ref 1.005–1.030)
Urobilinogen, UA: 1 mg/dL (ref 0.0–1.0)
pH: 6 (ref 5.0–8.0)

## 2010-11-15 LAB — CBC
HCT: 39.6 % (ref 39.0–52.0)
Hemoglobin: 13.6 g/dL (ref 13.0–17.0)
MCH: 31 pg (ref 26.0–34.0)
MCHC: 34.3 g/dL (ref 30.0–36.0)
MCV: 90.2 fL (ref 78.0–100.0)
Platelets: 175 10*3/uL (ref 150–400)
RBC: 4.39 MIL/uL (ref 4.22–5.81)
RDW: 13.5 % (ref 11.5–15.5)
WBC: 14.5 10*3/uL — ABNORMAL HIGH (ref 4.0–10.5)

## 2010-11-15 LAB — DIFFERENTIAL
Basophils Absolute: 0 10*3/uL (ref 0.0–0.1)
Basophils Relative: 0 % (ref 0–1)
Eosinophils Absolute: 0 10*3/uL (ref 0.0–0.7)
Eosinophils Relative: 0 % (ref 0–5)
Lymphocytes Relative: 7 % — ABNORMAL LOW (ref 12–46)
Lymphs Abs: 1 10*3/uL (ref 0.7–4.0)
Monocytes Absolute: 1.2 10*3/uL — ABNORMAL HIGH (ref 0.1–1.0)
Monocytes Relative: 8 % (ref 3–12)
Neutro Abs: 12.3 10*3/uL — ABNORMAL HIGH (ref 1.7–7.7)
Neutrophils Relative %: 85 % — ABNORMAL HIGH (ref 43–77)

## 2010-11-15 LAB — LIPASE, BLOOD: Lipase: 21 U/L (ref 11–59)

## 2010-11-15 LAB — OCCULT BLOOD, POC DEVICE: Fecal Occult Bld: NEGATIVE

## 2010-11-15 MED ORDER — IOHEXOL 300 MG/ML  SOLN
125.0000 mL | Freq: Once | INTRAMUSCULAR | Status: AC | PRN
  Administered 2010-11-15: 125 mL via INTRAVENOUS

## 2010-11-26 ENCOUNTER — Encounter: Payer: Self-pay | Admitting: Internal Medicine

## 2010-11-30 LAB — D-DIMER, QUANTITATIVE: D-Dimer, Quant: 0.28 ug/mL-FEU (ref 0.00–0.48)

## 2010-11-30 LAB — POCT I-STAT, CHEM 8
BUN: 13 mg/dL (ref 6–23)
Calcium, Ion: 1.09 mmol/L — ABNORMAL LOW (ref 1.12–1.32)
Chloride: 106 mEq/L (ref 96–112)
Creatinine, Ser: 0.5 mg/dL (ref 0.4–1.5)
Glucose, Bld: 102 mg/dL — ABNORMAL HIGH (ref 70–99)
HCT: 43 % (ref 39.0–52.0)
Hemoglobin: 14.6 g/dL (ref 13.0–17.0)
Potassium: 4 mEq/L (ref 3.5–5.1)
Sodium: 141 mEq/L (ref 135–145)
TCO2: 24 mmol/L (ref 0–100)

## 2010-11-30 LAB — COMPREHENSIVE METABOLIC PANEL
ALT: 39 U/L (ref 0–53)
AST: 30 U/L (ref 0–37)
Albumin: 3.7 g/dL (ref 3.5–5.2)
Alkaline Phosphatase: 80 U/L (ref 39–117)
BUN: 11 mg/dL (ref 6–23)
CO2: 22 mEq/L (ref 19–32)
Calcium: 9.4 mg/dL (ref 8.4–10.5)
Chloride: 108 mEq/L (ref 96–112)
Creatinine, Ser: 0.89 mg/dL (ref 0.4–1.5)
GFR calc Af Amer: >60 mL/min (ref >60)
GFR calc non Af Amer: >60 mL/min (ref >60)
Glucose, Bld: 93 mg/dL (ref 70–99)
Potassium: 4.2 mEq/L (ref 3.5–5.1)
Sodium: 141 mEq/L (ref 135–145)
Total Bilirubin: 1 mg/dL (ref 0.3–1.2)
Total Protein: 6.8 g/dL (ref 6.0–8.3)

## 2010-11-30 LAB — CBC
HCT: 39.5 % (ref 39.0–52.0)
Hemoglobin: 14 g/dL (ref 13.0–17.0)
MCHC: 35.5 g/dL (ref 30.0–36.0)
MCV: 93.8 fL (ref 78.0–100.0)
Platelets: 175 10*3/uL (ref 150–400)
RBC: 4.21 MIL/uL — ABNORMAL LOW (ref 4.22–5.81)
RDW: 14.7 % (ref 11.5–15.5)
WBC: 3.4 10*3/uL — ABNORMAL LOW (ref 4.0–10.5)

## 2010-11-30 LAB — POCT CARDIAC MARKERS
CKMB, poc: 2.1 ng/mL (ref 1.0–8.0)
CKMB, poc: <1 ng/mL — ABNORMAL LOW (ref 1.0–8.0)
Myoglobin, poc: 133 ng/mL (ref 12–200)
Myoglobin, poc: 81.6 ng/mL (ref 12–200)
Troponin i, poc: <0.05 ng/mL (ref 0.00–0.09)
Troponin i, poc: <0.05 ng/mL (ref 0.00–0.09)

## 2010-11-30 LAB — CARDIAC PANEL(CRET KIN+CKTOT+MB+TROPI)
CK, MB: 2.5 ng/mL (ref 0.3–4.0)
Relative Index: INVALID (ref 0.0–2.5)
Total CK: 50 U/L (ref 7–232)
Troponin I: 0.01 ng/mL (ref 0.00–0.06)

## 2010-11-30 LAB — CK TOTAL AND CKMB (NOT AT ARMC)
CK, MB: 2.9 ng/mL (ref 0.3–4.0)
Relative Index: INVALID (ref 0.0–2.5)
Total CK: 62 U/L (ref 7–232)

## 2010-11-30 LAB — LIPASE, BLOOD: Lipase: 18 U/L (ref 11–59)

## 2010-11-30 LAB — TROPONIN I: Troponin I: 0.03 ng/mL (ref 0.00–0.06)

## 2010-12-01 ENCOUNTER — Ambulatory Visit (INDEPENDENT_AMBULATORY_CARE_PROVIDER_SITE_OTHER): Payer: Medicare Other | Admitting: Internal Medicine

## 2010-12-01 ENCOUNTER — Encounter: Payer: Self-pay | Admitting: Internal Medicine

## 2010-12-01 DIAGNOSIS — J984 Other disorders of lung: Secondary | ICD-10-CM

## 2010-12-01 DIAGNOSIS — J019 Acute sinusitis, unspecified: Secondary | ICD-10-CM

## 2010-12-01 DIAGNOSIS — J449 Chronic obstructive pulmonary disease, unspecified: Secondary | ICD-10-CM

## 2010-12-01 NOTE — Patient Instructions (Signed)
Continue either prilosec and protonix before bfast and supper and pepcid 20 mg one at bedtime Remember that acid is to cough what oxygen is to fire  Please schedule a follow up office visit in 6 weeks, call sooner if needed with cxr

## 2010-12-01 NOTE — Progress Notes (Signed)
Subjective:    Patient ID: Carlos Romero, male    DOB: 05-19-1946, 65 y.o.   MRN: [id number]  HPI   Copy to: Dr. Blair Heys, Primary Doc   64 yowm quit smoking 1988 with typical smoker's cough resolved and lifelong issues with fall sneezing and coughing.  December 12, 2009 cc cough onset Oct 2010 with sneezing assoc with tickle in throat and urge to clear much worse in evening before at bedtime best when wake up in am and non productive. Prednisone not helping Kozlow, ENT eval no benefit per pt. Treated aggressively for acid reflux but not for nonacid issues (still taking fish oil daily). rec diet, 1st gen antihistamine and dex am pepcid hs and 6 days only of prednisone. advair / dulra/ alb   April 02, 2010 Acute visit. Pt states that cough is worse x 2 days- still prod with clear sputum. He states wheezing worse also- notices this with coughing spells. Slt improvement with high dose prednisone. In retrospect maybe better with dulera than anything else he's tried. rec restart symbicort, continue rx for GERD.   October 30, 2010 - acute office viist cc prod cough with clear mucus, wheezing, increased SOB x3days . Cough is rattling in chest , worse in am. coughed up thick yellow mucus this am. Low grade temp Mucinex is not helping. Augmentin 875mg  two times a day for 7 days  Mucinex two times a day as needed cough/congestion  Prednisone taper over next week. > sinus and chest ct Pos sinusitis/ ? LL as dz   November 07, 2010 ov Dyspnea and cough- the same worse when lie down at night with no sign mucus production, mild doe, not using any sinus rx at all x 2 different H1s. rec 1) stop chortrimeton and tylenol pm  2) Prednisone 4 each am x 2days, 2x2days, 1x2days and stop  3) Augmentin 875 mg twice daily x 14 days  4) Saline nasal spray  5) for cough >> mucinex dm 2 every 12 hours and add tramadol 50 mg up to every 4 hours to suppress the urge to cough. Swallowing water or using ice chips/non mint and  menthol containing candies (such as lifesavers or sugarless jolly ranchers) are also effective  6) for wheeze> dulera 100 2 puffs every 12 hours if needed  7) Work on inhaler technique  12/01/2010 ov cc cough much better, no sob, no noct complaints    Pt denies any significant sore throat, dysphagia, itching, sneezing,  nasal congestion or excess/ purulent secretions,  fever, chills, sweats, unintended wt loss, pleuritic or exertional cp, hempoptysis, orthopnea pnd or leg swelling.    Also denies any obvious fluctuation of symptoms with weather or environmental changes or other aggravating or alleviating factors.       Past Medical History:  Pulmonary embolism 1967  PUD sp perf 1967  Prostate ca 2003  Depression 1999  GERD with Barretts...................................Marland KitchenBuccini  - EGD 2009 Pos > EGD 04/18/10 : "ok" per verbal report  Hyperlipidemia  Chronic cough.............................................Marland KitchenWert  - Onset Oct 2010, better after prednisone short course 12/2009  - Sinus CT 11/06/10 Bilateral maxillary, ethmoid and possibly frontal sinusitis > 21 days augmentin  COPD - GOLD II  - PFT's 06/12/09 FEV1 2.25 (65%) ratio 62 and no better with B2  SPN RMl  - CT Chest 11/05/2009 > scheduled f/u 11/06/2010 > LLLAS Dz           Review of Systems     Objective:  Physical Exam      wt 234 Jan 08, 2010 > > 245 September 08, 2010 >>246 October 30, 2010 > 244 November 07, 2010 > 241 12/01/10 amb somber wm nad  HEENT: nl dentition, turbinates, and orophanx. Nl external ear canals without cough reflex  NECK : without JVD/Nodes/TM/ nl carotid upstrokes bilaterally  LUNGS: no acc muscle use, insp and exp rhonchi bilaterally  CV: RRR no s3 or murmur or increase in P2, no edema  ABD: soft and nontender with nl excursion in the supine position. No bruits or organomegaly, bowel sounds nl  MS: warm without deformities, calf tenderness, cyanosis or clubbing   Assessment & Plan:

## 2010-12-02 ENCOUNTER — Encounter: Payer: Self-pay | Admitting: Internal Medicine

## 2010-12-02 NOTE — Assessment & Plan Note (Signed)
This is the most likely cause of his  Classic Upper airway cough syndrome, so named because it's frequently impossible to sort out how much is  CR/sinusitis with freq throat clearing (which can be related to primary GERD)   vs  causing  secondary (" extra esophageal")  GERD from wide swings in gastric pressure that occur with throat clearing, often  promoting self use of mint and menthol lozenges that reduce the lower esophageal sphincter tone and exacerbate the problem further in a cyclical fashion.   These are the same pts who not infrequently have failed to tolerate ace inhibitors,  dry powder inhalers or biphosphonates or report having reflux symptoms that don't respond to standard doses of PPI , and are easily confused as having aecopd or asthma flares,   Will complete 21 days of augmentin then re check scan if symptoms flare on an otherwise unchanged rx.

## 2010-12-02 NOTE — Assessment & Plan Note (Signed)
   Each maintenance medication was reviewed in detail including most importantly the difference between maintenance and as needed and under what circumstances the prns are to be used.  Please see instructions for details which were reviewed in writing and the patient given a copy.   

## 2010-12-02 NOTE — Assessment & Plan Note (Signed)
Needs cxr on f/u ov

## 2010-12-31 ENCOUNTER — Other Ambulatory Visit: Payer: Self-pay | Admitting: Internal Medicine

## 2011-01-02 ENCOUNTER — Telehealth: Payer: Self-pay | Admitting: Internal Medicine

## 2011-01-02 MED ORDER — TRAMADOL HCL 50 MG PO TABS: ORAL_TABLET | ORAL | Status: DC

## 2011-01-02 NOTE — Telephone Encounter (Signed)
Pt aware rx was sent to pharmacy. Pt verbalized understanding and needed nothing further

## 2011-01-02 NOTE — Telephone Encounter (Signed)
Pt requesting refill on tramadol. Pt last seen 11/07/10 and has pending OV 01/12/11. Pt was giving rx for tramadol 50 mg #40 on 11/07/10 x 0 refill. Please advised Dr. Sherene Sires if okay to refill. Thanks  Carver Fila, CMA

## 2011-01-02 NOTE — Telephone Encounter (Signed)
Ok to refill tramadol x 40 once

## 2011-01-06 MED ORDER — PANTOPRAZOLE SODIUM 40 MG PO TBEC: 40.0000 mg | DELAYED_RELEASE_TABLET | Freq: Every day | ORAL | Status: DC
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
## 2011-01-06 NOTE — Op Note (Signed)
NAME:  Carlos Romero, Carlos Romero NO.:  [id number]   MEDICAL RECORD NO.:  [id number]          PATIENT TYPE:  AMB   LOCATION:  DSC                          FACILITY:  [facility]   PHYSICIAN:  Myeong O. Sheard, D.P.M.DATE OF BIRTH:  12/23/1945   DATE OF PROCEDURE:  08/11/2007  DATE OF DISCHARGE:  08/11/2007                               OPERATIVE REPORT   SURGEON:  Myeong O. Sheard, D.P.M.   PREOPERATIVE DIAGNOSIS:  1. Plantar flexed fifth metatarsal bone left foot with painful plantar      skin lesion.  2. Hammer toe deformities in fourth and fifth digits of the left foot.  3. Soft tissue mass left foot plantar medial aspect from the first      metatarsal head left foot.   POSTOPERATIVE DIAGNOSIS:  1. Plantar flexed fifth metatarsal bone left foot with painful plantar      skin lesion.  2. Hammer toe deformities in fourth and fifth digits of the left foot.  3. Soft tissue mass left foot plantar medial aspect from the first      metatarsal head left foot.   PROCEDURE:  1. Metatarsal osteotomy with screw fixation fifth left foot.  2. Hemiphalangectomy with arthroplasty fourth digit left foot.  3. Hemiphalangectomy with arthroplasty fifth digit left foot.  4. Excision plantar soft tissue lesion under the first metatarsal head      of the left foot.   ANESTHESIA:  IV sedation and MAC with local fifth digit with mixture of  0.5% Marcaine plain and 1% Xylocaine plain total 10 mL to surgical  field.   ESTIMATED BLOOD LOSS:  Minimal.  Hemostasis achieved by pneumatic ankle  tourniquet with a pressure of 250 mmHg pressure.   INDICATIONS FOR PROCEDURE:  This is a 65 year old white male with  chronic foot pain due to congested digits and painful lesion under the  fifth metatarsal head area and also under the first metatarsal bone with  a palpable mass that has been painful upon ambulation and in the shoes.  At this time the patient is seeking surgical intervention to reduce the  pain.   FINDINGS:  Revealed hypertrophic and prominent fifth metatarsal head on  the left with hypertrophic and prominent metaphalangeal bone of the  fourth and fifth digits left foot and also fibrotic soft tissue mass  located under the first metatarsal phalangeal joint area of the left  foot.   DESCRIPTION OF PROCEDURE:  The patient was brought to the operating room  and placed supine position and then under the influence of IV sedation,  the local anesthesia was injected and the left foot was prepped and  draped in the usual sterile fashion.  The following procedure was  performed:   Procedure #1.  Metatarsal osteotomy with screw fixation fifth left foot.  The patient was directed to the dorsal lateral aspect of the fifth  metatarsal shaft area where a 3 cm long linear skin incision was placed  from the midpoint of the fifth metatarsal bone dorsal aspect and  extended distally to the metatarsal head area and this incision was  deepened in soft tissues which directed medially and laterally and the  metatarsal bone and the capsular tissue was identified.  Capsular  incision was also placed, same plane as the skin incision.  Distal 1/3  of the fifth metatarsal head and shaft was dissected reflecting all soft  tissue including the capsular tissue.  After the metatarsal head and  neck was cleaned from all soft tissue, angular osteotomy was carried out  from this dorsal to proximal plantar with sagittal saw and upon  completion of osteotomy the capital fragment was repositioned slightly  medial, proximal, and dorsal direction and this capital fragment was  then reapproximated with the AO fixation technique by placing 2.0  cortical screw 14 mm in length.  Upon completion of this fixation, the  metatarsal head was examined and found to be stable and firm and this  area was well flushed and closure was carried out with 4-0 Dexon and 5-0  Dexon for the capsular structure and subcutaneous  structures.   Procedure #2.  Hemiphalangectomy with arthroplasty fourth digit left  foot.  Attention was directed to the dorsal aspect of the fourth digit.  Linear skin incision was placed over the distal interphalangeal joint  area and capsular incision was placed same plane as the capsular joint  and distal 1/2 of metaphalangeal bone was dissected and reflected off  soft tissue.  0.5 cm of metaphalangeal head was resected with a sagittal  saw.  The remaining bone was rasped smooth.  The digit was brought back  dorsally and proximally and the tissue was realigned and found to be in  rectus position.  The redundant skin was removed at this time and the  digit was placed in rectus position and repair was made with 4-0 Dexon  for the inner capsular structure and 5-0 nylon for the skin.   Procedure #3.  Hemiphalangectomy with arthroplasty fifth digit left  foot.  Semielliptical double skin incision was placed over this  phalangeal joint area.  Incision was distal medial to proximal lateral.  Redundant skin was removed.  Distal interphalangeal joint area capsular  incision was placed and the distal one-half of metaphalangeal bone was  dissected and removed excess  bone and the protruding bone.  Digit was  palpated and found that there was a fair protruding bone by the proximal  phalangeal head which was also resected about 0.8 cm.  The remainder of  the bone was rasped smooth.  This digit was reexamined and skin edges  were brought together and found to be in the rectus  position without  further varus rotation.  Therefore area was flushed and closure was  carried out with 4-0 Dexon and 5-0 nylon.   Procedure #4.  Excision soft tissue mass from the first metatarsal head  left foot.  Attention was directed to plantar medial aspect of the first  metatarsal head area where about 4-5 cm long skin incision was placed  along the medial natural skin line.  The incision was deepened and  subdermal  layer was undermined to separate the soft tissue mass from its  attachment to the plantar capsular tissue of the first metatarsal  phalangeal joint area.  This soft tissue was then dissected via blunt  and sharp dissection and was removed which was about 2 x 4 cm in  diameter in total.  This specimen was sent out to pathologic  examination.  The area was examined and found to be free of any unusual  soft tissue  mass.  The surrounding tissue was found to be normal without  any structural deviation.  Therefore the area was well flushed and  closure was carried out with 4-0 Dexon and 5-0 nylon.  Upon completion  of this procedure, total 1 mL of Dexamethasone was injected to the  surgical site and left foot was dressed with Betadine-soaked dry gauze  dressing and followed with Ace bandage.  The patient tolerated this  procedure well and was transported to the recovery room in satisfactory  condition.  Postoperative home care instructions were given and  prescription was written for Darvocet-N 100 to take one every 4-6 hours,  total 30 tablets and also Relafen 500 mg to take one every 12 hours with  food.           ______________________________  Joline Maxcy. Raynald Kemp, D.P.M.     MOS/MEDQ  D:  08/15/2007  T:  08/15/2007  Job:  [id number]

## 2011-01-07 ENCOUNTER — Encounter: Payer: Self-pay | Admitting: *Deleted

## 2011-01-09 NOTE — Op Note (Signed)
NAME:  Carlos Romero, Carlos Romero NO.:  [id number]   MEDICAL RECORD NO.:  [id number]          PATIENT TYPE:  AMB   LOCATION:  NESC                         FACILITY:  [facility]   PHYSICIAN:  Ronald L. Earlene Plater, M.D.  DATE OF BIRTH:  [birth year]   DATE OF PROCEDURE:  04/10/2005  DATE OF DISCHARGE:                                 OPERATIVE REPORT   DIAGNOSIS:  History of radical retropubic prostatectomy with urethral  stricture disease.   OPERATIVE PROCEDURE:  Cystourethroscopy, optical urethrotomy with dilation  of urethral stricture.   SURGEON:  Lucrezia Starch. Earlene Plater, M.D.   ANESTHESIA:  LMA.   ESTIMATED BLOOD LOSS:  Negligible.   TUBES:  None.   COMPLICATIONS:  None.   INDICATIONS FOR PROCEDURE:  Mr. Linch is a very nice, 65 year old white male  who had a history of radical retropubic prostatectomy in July, 1999.  He  developed urethral stricture disease and has had to be dilated twice.  Despite that, he had some recurrence, and we felt optical urethrotomy was  probably best.  After understanding the risks, benefits, and alternatives,  he has elected to proceed.   PROCEDURE IN DETAIL:  The patient was placed in the supine position.  After  proper LMA anesthesia, he was placed in the dorsal lithotomy position and  prepped and draped with Betadine in a sterile fashion.  Cystourethroscopy  was performed with an optical urethrotome, and urethral stricture disease  was noted just distal to the external sphincter.  Utilizing the optical  urethrotome in the 12 o'clock position, this was incised to bleeding tissue,  carefully avoiding the external sphincter.  The scope was then passed into  the bladder. The bladder was inspected.  It was smooth walled.  Efflux of  clear urine was noted from the normally-placed ureteral orifices  bilaterally, and the bladder neck was widely patent.  The scope was removed,  and the urethra was calibrated to 34-French with Sissy Hoff sounds easily,  and the  patient was taken to the recovery room stable.      Ronald L. Earlene Plater, M.D.  Electronically Signed     RLD/MEDQ  D:  04/10/2005  T:  04/10/2005  Job:  [id number]
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
## 2011-01-09 NOTE — Op Note (Signed)
NAME:  CAILLOU, MINUS NO.:  [id number]   MEDICAL RECORD NO.:  [id number]                   PATIENT TYPE:  AMB   LOCATION:  ENDO                                 FACILITY:  [facility]   PHYSICIAN:  Bernette Redbird, M.D.                DATE OF BIRTH:  March 21, 1946   DATE OF PROCEDURE:  07/07/2002  DATE OF DISCHARGE:                                 OPERATIVE REPORT   PROCEDURE:  Colonoscopy with biopsy and polypectomy.   INDICATIONS FOR PROCEDURE:  This is a 65 year old gentleman who has had  intermittent rectal bleeding. He also has had radiation treatment for  recurrent prostate cancer.   FINDINGS:  Large polyp in mid rectum. Diminutive polyp in left colon. Distal  rectal polyp. Sigmoid diverticulosis.   DESCRIPTION OF PROCEDURE:  The nature, purpose and risk of the procedure had  been discussed with the patient who provided written consent. Digital exam  showed an absent prostate gland.   Sedation for this procedure and the upper endoscopy which preceded it  totaled fentanyl 162.5 mcg and Versed 15 mg IV without arrhythmias or  desaturations. The large amount of medication is attributed to some delay in  starting this procedure, the need for resedation and the length of this  procedure.   The procedure was initiated with the Olympus adult video colonoscope which  was able to be advanced to roughly the region of the splenic flexure or mid  left colon, whereupon there was an angulation which was hard for the scope  to negotiate so I elected to pullout and use the pediatric video  colonoscope.   On the way in, I encountered a sessile 2 mm polyp removed by a single cold  biopsy at about 40 cm from the external anal opening.   There was also a very large, roughly 6 cm - 8 cm diameter sessile polyp  draped over a fold in the mid rectum at about 8 cm from the external anal  opening. The leading edge of this polyp was injected with epinephrine to  raise it  or elevate it to some degree and then it was carved off piecemeal  in approximately 15 pieces, averaging 5-8 mm in size. Some of these were  able to be suctioned through the scope, the rest were retrieved by pulling  them out with the scope through the anus.   There was no significant bleeding and no evidence of excessive cautery and  at the conclusion, it appeared that there was no residual polyp tissue  present.   The patient was then recolonoscoped using the adjustable tension pediatric  video colonoscope which was able to negotiate the kinked or angulated area  in the left colon fairly easily and, with some looping, reach the cecum as  identified by visualization of the appendiceal orifice and ileocecal valve.  Pullback was then performed. The quality of the prep was  excellent. There  was some degree of angulation and spasm so it is not clear that all areas  were optimally seen but I do not think any major lesions were missed.   There was some left sided diverticulosis.   There was no evidence of colitis or vascular ectasia.   Retroflexion of the rectum showed a small semipedunculated 5 mm polyp  removed by snare technique and suctioned through the scope.   There were also some moderate internal hemorrhoids during pullout through  the anal canal.   The patient tolerated the procedure well and there were no apparent  immediate complications.   IMPRESSION:  1. Large mid-rectal polyp, removed piecemeal.  2. Smaller polyps in the left colon or sigmoid region and in the distal     rectum.  3. Left sided diverticulosis.  4. Kink in the distal colon necessitating the use of the pediatric     colonoscope.   PLAN:  Await pathology. Anticipate early colonoscopic follow-up, perhaps as  early as six months from now to touchup any residual polyp tissue at the  site of the large polyp removed today.                                               Bernette Redbird, M.D.    RB/MEDQ  D:   07/07/2002  T:  07/07/2002  Job:  [id number]   cc:   Bryan Lemma. Manus Gunning, M.D.  [address] [place]  [place] [address]  Fax: [phone]

## 2011-01-09 NOTE — Op Note (Signed)
NAME:  NELS, MUNN NO.:  [id number]   MEDICAL RECORD NO.:  [id number]          PATIENT TYPE:  OUT   LOCATION:  DFTL                         FACILITY:  [facility]   PHYSICIAN:  Myeong O. Sheard, D.P.M.DATE OF BIRTH:  25-Apr-1946   DATE OF PROCEDURE:  03/16/2005  DATE OF DISCHARGE:  03/16/2005                                 OPERATIVE REPORT   PREOPERATIVE DIAGNOSIS:  Painful corn with contracted digit, fourth digit,  right foot.   POSTOPERATIVE DIAGNOSIS:  Painful corn with contracted digit, fourth digit,  right foot.   PROCEDURE:  Hammertoe repair with pin fixation, fourth digit, right foot.  Hemostasis achieved by a pneumatic ankle tourniquet.   ANESTHESIA:  Anesthesia team with IV sedation and MAC.   ESTIMATED BLOOD LOSS:  Minimum.   INDICATIONS FOR PROCEDURE:  This is a 65 year old male who came to the  office with a contracted digit and a painful corn at the distal end of the  fourth digit, right foot due to an extreme contracture at the  interphalangeal joint causing the pain each time he bears weight on the  right foot.  At this time the patient is seeking surgical intervention.  On  the x-ray the patient was noted to have an extreme contracture riding  underneath the adjacent third digit, which was causing the pressure upon  ambulation.   The intraoperative findings revealed hypotrophic phalangeal bones at the  distal interphalangeal joint with the  __________ rotated fourth digit, and  surrounding soft tissue and osseous structures were found to be within  normal limits.   DESCRIPTION OF PROCEDURE:  The patient was brought to the operating room.  The right foot was prepped and draped in the usual sterile manner and the  following procedure was performed:  1.  Hammertoe repair with pin fixation  of the fourth digit on the right.  Attention was directed to the dorsal  aspect of the fourth digit of the right foot where a semi-elliptical double  skin  incision was placed in an oblique fashion directly distal medial to  proximal lateral, and this redundant skin was removed, which was about 30  degrees off of the horizontal line.  After this skin was removed, a capsular  incision was placed at the distal inter-phalangeal joint and the medial  phalangeal bone was exposed and resected about one-half of the distal end of  the medial phalangeal bone.  At this time the digit was brought proximally  and lined up and bound to the in-straight position.  Also noted a tight  flexor tendon which was released at this time.  Once the digit was able to  lay straight, the 0.045 K-wire was placed from the base of the distal  phalanx.  The K-wire was inserted at the center of the distal phalangeal  base and penetrated to the end of the distal end of the skin, distal end of  the toe.  After retracting the 0.045 K-wire, the digit was aligned and the K-  wire was re-inserted to line up the medial phalanx and the proximal phalanx.  The  digit appeared to be in a straight position with the help of the K-wire,  and at this time the excess K-wire was bent and cut, and the area was well-  flushed, and the closure was carried out with #4-0 Vicryl for the inner  structure and #5-0 nylon for the outer skin.  Kenalog 0.5 mL was injected  into the surgical site and the wound was dressed with a Betadine-soaked  sterile gauze dressing, and followed with an Ace bandage.   The patient tolerated this procedure well and was transported to the  recovery room in satisfactory condition.   Postoperative instructions were given with a prescription for Darvocet-N  100, to take one q.4-6h., a total of #30 tablets.  Also to take Keflex 500  mg, one q.8h., total of #30 tablets.       MOS/MEDQ  D:  03/31/2005  T:  04/01/2005  Job:  [id number]

## 2011-01-09 NOTE — Op Note (Signed)
NAME:  Carlos Romero, Carlos Romero                  ACCOUNT NO.:  [id number]   MEDICAL RECORD NO.:  [id number]                   PATIENT TYPE:  AMB   LOCATION:  ENDO                                 FACILITY:  [facility]   PHYSICIAN:  Bernette Redbird, M.D.                DATE OF BIRTH:  [birth year]   DATE OF PROCEDURE:  07/07/2002  DATE OF DISCHARGE:                                 OPERATIVE REPORT   PROCEDURE:  Upper endoscopy with biopsies.   INDICATION:  Longstanding reflux in a 65 year old patient, who is male and  Caucasian, and thus at some risk for Barrett's esophagus.   FINDINGS:  Short segment Barrett's esophagus above a small hiatal hernia.  Status post apparent partial gastrectomy with gastroenterostomy.   DESCRIPTION OF PROCEDURE:  The nature, purpose, and risks of the procedure  had been discussed with the patient who provided written consent.  Sedation  was fentanyl 75 mcg and Versed 7 mg IV without arrhythmias or desaturation.  The Olympus video endoscope was passed under direct vision.  The vocal cords  and larynx looked essentially normal.  The esophagus was entered without  difficulty.  In the distal esophagus, there was a postage-stamp sized patch  of Barrett's mucosa of the squamocolumnar junction.  There also appeared to  be an esophageal ring which was fractured by passage of the scope.  There  was also a minimal amount of Barrett's on the contralateral wall from the  main patch noted above.  There was no extensive Barrett's esophagus and no  evidence of neoplasia, reflux esophagitis, free reflux, varices, infection,  or neoplasia.  Multiple biopsies were obtained from the Barrett's-appearing  mucosa.  There was a 3 cm hiatal hernia.   The stomach appeared to have been surgically altered.  There was no  discernible pylorus but rather a smooth transition from the antrum of the  stomach into the proximal small bowel.  My belief is that this is probably  an antrectomy with a  Billroth-I anastomosis, although conceivably a V&P  might have a similar appearance.  The patient appeared to have the majority  of his stomach intact, thus, he did  not have an extensive gastric  resection.  The small bowel looked normal.  The gastric mucosa was free of  gastritis, erosions, ulcers, polyps, or masses.   The patient tolerated this procedure well, and there were no apparent  complications.   IMPRESSION:  Short segment Barrett's esophagus, as noted above.    PLAN:  1. Await pathology and biopsies.  2. Continue proton pump inhibitor therapy with Nexium.                                               Bernette Redbird, M.D.    RB/MEDQ  D:  07/07/2002  T:  07/07/2002  Job:  [id number]   cc:   Bryan Lemma. Manus Gunning, M.D.  [address] [place]  [place] [address]  Fax: [phone]

## 2011-01-09 NOTE — Op Note (Signed)
NAME:  Carlos Romero, Carlos Romero NO.:  [id number]   MEDICAL RECORD NO.:  [id number]          PATIENT TYPE:  EMS   LOCATION:  ED                           FACILITY:  [facility]   PHYSICIAN:  Excell Seltzer. Annabell Howells, M.D.    DATE OF BIRTH:  15-Jan-1946   DATE OF PROCEDURE:  08/03/2004  DATE OF DISCHARGE:                                 OPERATIVE REPORT   OPERATION/PROCEDURE:  Cystoscopy and dilation of urethral stricture with  filiform and followers.   PREOPERATIVE DIAGNOSIS:  Bladder neck contracture.   POSTOPERATIVE DIAGNOSIS:  Bulbar urethral stricture.   ANESTHESIA:  Local.   SURGEON:  Excell Seltzer. Annabell Howells, M.D.   COMPLICATIONS:  None.   INDICATIONS:  [name] is a 65 year old white male with a history of radical  prostatectomy in 1999. Radiation therapy approximately two years later.  He  has had a history of bladder neck contracture by description and has  undergone prior cystoscopy and dilation.  He began to have progressive  voiding difficulty over the last several days and became significantly worse  last night.  He came to the ER today where an attempt was made to place a  catheter without success.   FINDINGS AND PROCEDURE:  The patient was Betadine solution and draped with  sterile towels.  His urethra was instilled with 2% lidocaine jelly.  The 67-  [place] scope was passed per urethra.  Examination revealed a normal urethra  until the very proximal bulb where he was noted to have a stricture with a  little bit of membrane over it, possibly due to the catheter attempt.  I was  able to negotiate the 16-French scope through the stricture.  The external  sphincter was intact.  The bladder neck had no evidence of contraction.  The  bladder wall had mild trabeculation but no tumor, stones or inflammation.  The ureteral orifices were unremarkable.  After completion of the  cystoscopic examination, the cystoscope was removed.  A 4-French filiform  was passed and the urethral stricture  was then  dilated to 24-French with followers.  After completion of the dilation, the  patient was encouraged to stand and void.  He voided with an excellent  stream comfortably and emptied well.  There were no complications during the  procedure.  He was given Cipro 500 mg p.o. x1 and was encouraged to follow  up with Dr. Earlene Plater next week.      JJW/MEDQ  D:  08/03/2004  T:  08/03/2004  Job:  [id number]   cc:   Windy Fast L. Ovidio Hanger, M.D.  [address]  [place]  [place] [address]  Fax: [phone]

## 2011-01-12 ENCOUNTER — Other Ambulatory Visit (INDEPENDENT_AMBULATORY_CARE_PROVIDER_SITE_OTHER): Payer: Medicare Other

## 2011-01-12 ENCOUNTER — Ambulatory Visit (INDEPENDENT_AMBULATORY_CARE_PROVIDER_SITE_OTHER): Payer: Medicare Other | Admitting: Internal Medicine

## 2011-01-12 ENCOUNTER — Encounter: Payer: Self-pay | Admitting: Internal Medicine

## 2011-01-12 ENCOUNTER — Ambulatory Visit (INDEPENDENT_AMBULATORY_CARE_PROVIDER_SITE_OTHER)
Admission: RE | Admit: 2011-01-12 | Discharge: 2011-01-12 | Disposition: A | Discharge status: Home / Self Care | Payer: Medicare Other | Source: Ambulatory Visit | Attending: Internal Medicine | Admitting: Internal Medicine

## 2011-01-12 VITALS — BP 122/80 | HR 61 | Temp 97.7°F | Ht 74.0 in | Wt 247.0 lb

## 2011-01-12 DIAGNOSIS — R059 Cough, unspecified: Secondary | ICD-10-CM

## 2011-01-12 DIAGNOSIS — R05 Cough: Secondary | ICD-10-CM

## 2011-01-12 DIAGNOSIS — J449 Chronic obstructive pulmonary disease, unspecified: Secondary | ICD-10-CM

## 2011-01-12 DIAGNOSIS — J019 Acute sinusitis, unspecified: Secondary | ICD-10-CM

## 2011-01-12 DIAGNOSIS — J984 Other disorders of lung: Secondary | ICD-10-CM

## 2011-01-12 DIAGNOSIS — J4489 Other specified chronic obstructive pulmonary disease: Secondary | ICD-10-CM

## 2011-01-12 LAB — CBC WITH DIFFERENTIAL/PLATELET
Basophils Absolute: 0 10*3/uL (ref 0.0–0.1)
Basophils Relative: 0.5 % (ref 0.0–3.0)
Eosinophils Absolute: 0.1 10*3/uL (ref 0.0–0.7)
Eosinophils Relative: 3.4 % (ref 0.0–5.0)
HCT: 39.1 % (ref 39.0–52.0)
Hemoglobin: 13.6 g/dL (ref 13.0–17.0)
Lymphocytes Relative: 34.4 % (ref 12.0–46.0)
Lymphs Abs: 1.4 10*3/uL (ref 0.7–4.0)
MCHC: 34.8 g/dL (ref 30.0–36.0)
MCV: 91.3 fl (ref 78.0–100.0)
Monocytes Absolute: 0.3 10*3/uL (ref 0.1–1.0)
Monocytes Relative: 8.7 % (ref 3.0–12.0)
Neutro Abs: 2.1 10*3/uL (ref 1.4–7.7)
Neutrophils Relative %: 53 % (ref 43.0–77.0)
Platelets: 195 10*3/uL (ref 150.0–400.0)
RBC: 4.28 Mil/uL (ref 4.22–5.81)
RDW: 14.2 % (ref 11.5–14.6)
WBC: 4 10*3/uL — ABNORMAL LOW (ref 4.5–10.5)

## 2011-01-12 MED ORDER — PANTOPRAZOLE SODIUM 40 MG PO TBEC: DELAYED_RELEASE_TABLET | ORAL | Status: DC

## 2011-01-12 NOTE — Assessment & Plan Note (Signed)
Most c/w  Classic Upper airway cough syndrome, so named because it's frequently impossible to sort out how much is  CR/sinusitis with freq throat clearing (which can be related to primary GERD)   vs  causing  secondary (" extra esophageal")  GERD from wide swings in gastric pressure that occur with throat clearing, often  promoting self use of mint and menthol lozenges that reduce the lower esophageal sphincter tone and exacerbate the problem further in a cyclical fashion.   These are the same pts who not infrequently have failed to tolerate ace inhibitors,  dry powder inhalers or biphosphonates or report having reflux symptoms that don't respond to standard doses of PPI , and are easily confused as having aecopd or asthma flares,   For now assume it's mostly unresolved sinus dz until proven otherwise  See instructions for specific recommendations which were reviewed directly with the patient who was given a copy with highlighter outlining the key components.

## 2011-01-12 NOTE — Progress Notes (Signed)
Quick Note:  Spoke with pt and notified of results per Dr. Wert. Pt verbalized understanding and denied any questions.  ______ 

## 2011-01-12 NOTE — Assessment & Plan Note (Signed)
GOLD II - Well compensated on present rx., reviewed

## 2011-01-12 NOTE — Assessment & Plan Note (Signed)
If not cleared by now needs formal ent eval, Allergy studies also sent

## 2011-01-12 NOTE — Progress Notes (Signed)
Subjective:    Patient ID: Carlos Romero, male    DOB: [birth year], 65 y.o.   MRN: [id number]  HPI   Copy to: Dr. Blair Heys, Primary Doc   64 yowm quit smoking 1988 with typical smoker's cough resolved and lifelong issues with fall sneezing and coughing.  December 12, 2009 cc cough onset Oct 2010 with sneezing assoc with tickle in throat and urge to clear much worse in evening before at bedtime best when wake up in am and non productive. Prednisone not helping Kozlow, ENT eval no benefit per pt. Treated aggressively for acid reflux but not for nonacid issues (still taking fish oil daily). rec diet, 1st gen antihistamine and dex am pepcid hs and 6 days only of prednisone. advair / dulra/ alb   April 02, 2010 Acute visit. Pt states that cough is worse x 2 days- still prod with clear sputum. He states wheezing worse also- notices this with coughing spells. Slt improvement with high dose prednisone. In retrospect maybe better with dulera than anything else he's tried. rec restart symbicort, continue rx for GERD.   October 30, 2010 - acute office viist cc prod cough with clear mucus, wheezing, increased SOB x3days . Cough is rattling in chest , worse in am. coughed up thick yellow mucus this am. Low grade temp Mucinex is not helping. Augmentin 875mg  two times a day for 7 days  Mucinex two times a day as needed cough/congestion  Prednisone taper over next week. > sinus and chest ct 3/15 : Pos sinusitis/ ? LL as dz   November 07, 2010 ov Dyspnea and cough- the same worse when lie down at night with no sign mucus production, mild doe, not using any sinus rx at all x 2 different H1s. rec 1) stop chortrimeton and tylenol pm  2) Prednisone 4 each am x 2days, 2x2days, 1x2days and stop  3) Augmentin 875 mg twice daily x21 days   4) Saline nasal spray  5) for cough >> mucinex dm 2 every 12 hours and add tramadol 50 mg up to every 4 hours to suppress the urge to cough. Swallowing water or using ice chips/non  mint and menthol containing candies (such as lifesavers or sugarless jolly ranchers) are also effective  6) for wheeze> dulera 100 2 puffs every 12 hours if needed  7) Work on inhaler technique  12/01/2010 ov cc cough much better, no sob, no noct complaints.  .Continue either prilosec or  protonix before bfast and supper and pepcid 20 mg one at bedtime Remember that acid is to cough what oxygen is to fire     01/12/2011 ov/ worse x 5 days p marked improved on augmentin  - no purulent sputum or obvious nasal co's on nettipot only. No sob. Pt denies any significant sore throat, dysphagia, itching, sneezing,  nasal congestion or excess/ purulent secretions,  fever, chills, sweats, unintended wt loss, pleuritic or exertional cp, hempoptysis, orthopnea pnd or leg swelling.    Also denies any obvious fluctuation of symptoms with weather or environmental changes or other aggravating or alleviating factors.  Sleeping ok without nocturnal  or early am exac of resp c/o's or need for noct saba.      Past Medical History:  Pulmonary embolism 1967  PUD sp perf 1967  Prostate ca 2003  Depression 1999  GERD with Barretts...................................Marland KitchenBuccini  - EGD 2009 Pos > EGD 04/18/10 : "ok" per verbal report  Hyperlipidemia  Chronic cough.............................................Marland Kitchen  - Onset Oct 2010, better after  prednisone short course 12/2009  - Sinus CT 11/06/10 Bilateral maxillary, ethmoid and possibly frontal sinusitis > 21 days augmentin >    f/u ct sinus ordered 01/12/2011  COPD - GOLD II  - PFT's 06/12/09 FEV1 2.25 (65%) ratio 62 and no better with B2  SPN RMl  - CT Chest 11/05/2009 > scheduled f/u 11/06/2010 > LLLAS Dz > cleared 01/12/2011 p 3 weeks augmentin          Review of Systems     Objective:   Physical Exam      wt 234 Jan 08, 2010 > > 245 September 08, 2010 >>246 October 30, 2010 > 244 November 07, 2010 > 241 12/01/10> 246 01/12/2011  amb somber wm nad  HEENT: nl  dentition, turbinates, and orophanx. Nl external ear canals without cough reflex  NECK : without JVD/Nodes/TM/ nl carotid upstrokes bilaterally  LUNGS: no acc muscle use, insp and exp rhonchi bilaterally  CV: RRR no s3 or murmur or increase in P2, no edema  ABD: soft and nontender with nl excursion in the supine position. No bruits or organomegaly, bowel sounds nl  MS: warm without deformities, calf tenderness, cyanosis or clubbing  cxr 01/12/2011  Comparison: CT 11/06/2010. Plain films 07/09/2010  Findings: Heart and mediastinal contours are within normal limits. No focal opacities or effusions. No acute bony abnormality.  IMPRESSION: No active disease.   Assessment & Plan:

## 2011-01-12 NOTE — Patient Instructions (Addendum)
Please see patient coordinator before you leave today  to schedule sinus ct full coronal.  I will call you with the results to see if you need a sinus specialist or allergy specialist  In the meantime use mucinex dm as needed

## 2011-01-13 ENCOUNTER — Ambulatory Visit (INDEPENDENT_AMBULATORY_CARE_PROVIDER_SITE_OTHER)
Admission: RE | Admit: 2011-01-13 | Discharge: 2011-01-13 | Disposition: A | Discharge status: Home / Self Care | Payer: Medicare Other | Source: Ambulatory Visit | Attending: Internal Medicine | Admitting: Internal Medicine

## 2011-01-13 ENCOUNTER — Telehealth: Payer: Self-pay | Admitting: Internal Medicine

## 2011-01-13 DIAGNOSIS — J019 Acute sinusitis, unspecified: Secondary | ICD-10-CM

## 2011-01-13 LAB — ALLERGY PROFILE REGION II-DC, DE, MD, ~~LOC~~, VA
Allergen, D pternoyssinus,d7: <0.35 kU/L (ref <0.35)
Alternaria Alternata: <0.35 kU/L (ref <0.35)
Aspergillus fumigatus, IgG: <0.35 kU/L (ref <0.35)
Bermuda Grass: <0.35 kU/L (ref <0.35)
Box Elder IgE: <0.35 kU/L (ref <0.35)
Cat Dander: <0.35 kU/L (ref <0.35)
Cladosporium Herbarum: <0.35 kU/L (ref <0.35)
Cockroach: <0.35 kU/L (ref <0.35)
Common Ragweed: <0.35 kU/L (ref <0.35)
D. farinae: <0.35 kU/L (ref <0.35)
Dog Dander: <0.35 kU/L (ref <0.35)
Elm IgE: <0.35 kU/L (ref <0.35)
IgE (Immunoglobulin E), Serum: 211.2 IU/mL — ABNORMAL HIGH (ref 0.0–180.0)
Johnson Grass: <0.35 kU/L (ref <0.35)
Lamb's Quarters: <0.35 kU/L (ref <0.35)
Meadow Grass: <0.35 kU/L (ref <0.35)
Oak: <0.35 kU/L (ref <0.35)
Pecan/Hickory Tree IgE: <0.35 kU/L (ref <0.35)

## 2011-01-13 NOTE — Progress Notes (Signed)
Quick Note:  Spoke with pt and notified of results per Dr. Wert. Pt verbalized understanding and denied any questions.  ______ 

## 2011-01-13 NOTE — Telephone Encounter (Signed)
Pt states a nurse called him about his CT and he wanted to make sure he was still supposed to have CT done. I advised that it was scheduled for today and that there is no documentation stating otherwise. Pt will go to appt. Carron Curie, CMA

## 2011-02-18 ENCOUNTER — Telehealth: Payer: Self-pay | Admitting: Internal Medicine

## 2011-02-18 NOTE — Telephone Encounter (Signed)
Encounter closed in error, sent back to MW.

## 2011-02-18 NOTE — Telephone Encounter (Signed)
Spoke with pt and notified of results. He states that he needs to know if you still want to refer him to ENT. Pls advise, thanks

## 2011-02-18 NOTE — Telephone Encounter (Signed)
Sorry for the delay - no specific respiratory allergen identified

## 2011-02-18 NOTE — Telephone Encounter (Signed)
No need for ent eval at this point, return here if not satisfied with rx

## 2011-02-18 NOTE — Telephone Encounter (Signed)
Dr Sherene Sires, pls advise on allergy profile results, thanks!

## 2011-02-19 ENCOUNTER — Telehealth: Payer: Self-pay | Admitting: Internal Medicine

## 2011-02-19 NOTE — Telephone Encounter (Signed)
Previous phone note had been closed by accident but it read as follows: Pt advised.   Sandrea Hughs, MD 02/18/2011 7:13 PM Signed  No need for ent eval at this point, return here if not satisfied with rx Vernie Murders, [facility] 02/18/2011 4:23 PM Signed  Encounter closed in error, sent back to MW.  Vernie Murders, [facility] 02/18/2011 4:23 PM Signed  Spoke with pt and notified of results. He states that he needs to know if you still want to refer him to ENT. Pls advise, thanks Sandrea Hughs, MD 02/18/2011 4:10 PM Signed  Sorry for the delay - no specific respiratory allergen identified Vernie Murders, [facility] 02/18/2011 2:44 PM Signed  Dr Sherene Sires, pls advise on allergy profile results, thanks

## 2011-02-19 NOTE — Telephone Encounter (Signed)
[id number]. , CMA  

## 2011-04-28 ENCOUNTER — Telehealth: Payer: Self-pay | Admitting: Internal Medicine

## 2011-04-28 NOTE — Telephone Encounter (Signed)
I spoke with pt and he states his cough and congestion has returned. Pt is scheduled to come in and see TP in AM at 9 am

## 2011-04-29 ENCOUNTER — Ambulatory Visit (INDEPENDENT_AMBULATORY_CARE_PROVIDER_SITE_OTHER): Payer: Medicare Other | Admitting: Adult Health

## 2011-04-29 ENCOUNTER — Encounter: Payer: Self-pay | Admitting: Adult Health

## 2011-04-29 VITALS — BP 116/78 | HR 70 | Temp 97.5°F | Ht 73.0 in | Wt 248.6 lb

## 2011-04-29 DIAGNOSIS — J449 Chronic obstructive pulmonary disease, unspecified: Secondary | ICD-10-CM

## 2011-04-29 MED ORDER — AMOXICILLIN-POT CLAVULANATE 875-125 MG PO TABS: 1.0000 | ORAL_TABLET | Freq: Two times a day (BID) | ORAL | Status: AC

## 2011-04-29 MED ORDER — TRAMADOL HCL 50 MG PO TABS: ORAL_TABLET | ORAL | Status: DC

## 2011-04-29 MED ORDER — PREDNISONE 10 MG PO TABS: ORAL_TABLET | ORAL | Status: AC

## 2011-04-29 NOTE — Progress Notes (Signed)
Subjective:    Patient ID: Carlos Romero, male    DOB: 05/12/1946, 65 y.o.   MRN: [id number]  HPI   Copy to: Dr. Blair Heys, Primary Doc   64 yowm quit smoking 1988 with typical smoker's cough resolved and lifelong issues with fall sneezing and coughing.  December 12, 2009 cc cough onset Oct 2010 with sneezing assoc with tickle in throat and urge to clear much worse in evening before at bedtime best when wake up in am and non productive. Prednisone not helping Kozlow, ENT eval no benefit per pt. Treated aggressively for acid reflux but not for nonacid issues (still taking fish oil daily). rec diet, 1st gen antihistamine and dex am pepcid hs and 6 days only of prednisone. advair / dulra/ alb   April 02, 2010 Acute visit. Pt states that cough is worse x 2 days- still prod with clear sputum. He states wheezing worse also- notices this with coughing spells. Slt improvement with high dose prednisone. In retrospect maybe better with dulera than anything else he's tried. rec restart symbicort, continue rx for GERD.   October 30, 2010 - acute office viist cc prod cough with clear mucus, wheezing, increased SOB x3days . Cough is rattling in chest , worse in am. coughed up thick yellow mucus this am. Low grade temp Mucinex is not helping. Augmentin 875mg  two times a day for 7 days  Mucinex two times a day as needed cough/congestion  Prednisone taper over next week. > sinus and chest ct 3/15 : Pos sinusitis/ ? LL as dz   November 07, 2010 ov Dyspnea and cough- the same worse when lie down at night with no sign mucus production, mild doe, not using any sinus rx at all x 2 different H1s. rec 1) stop chortrimeton and tylenol pm  2) Prednisone 4 each am x 2days, 2x2days, 1x2days and stop  3) Augmentin 875 mg twice daily x21 days   4) Saline nasal spray  5) for cough >> mucinex dm 2 every 12 hours and add tramadol 50 mg up to every 4 hours to suppress the urge to cough. Swallowing water or using ice chips/non  mint and menthol containing candies (such as lifesavers or sugarless jolly ranchers) are also effective  6) for wheeze> dulera 100 2 puffs every 12 hours if needed  7) Work on inhaler technique  12/01/2010 ov cc cough much better, no sob, no noct complaints.  .Continue either prilosec or  protonix before bfast and supper and pepcid 20 mg one at bedtime Remember that acid is to cough what oxygen is to fire     01/12/2011 ov/Wert worse x 5 days p marked improved on augmentin  - no purulent sputum or obvious nasal co's on nettipot only. No sob.  >>CT sinus - Improvement since the prior exam. , frontal sinuses, ethmoid sinus air cells and sphenoid sinuses as well as visualized portions of the mastoid air cells and middle ear cavities are clear. Minimal slightly polypoid mucosal thickening medial aspect of the  left maxillary sinus. Partial clearing of the right maxillary sinus. Now noted is a 2.5 cm retention cyst or polyp within the anterior inferior aspect of the right maxillary sinus. RAST neg , IGE 211    04/29/2011 Acute OV  Pt complains of prod cough (clear and cloudy) and chest congestion  for 4 days - SOB with exertion. Started on mucinex dm and tramadol (ran out of tramadol yesterday) . Cough is getting worse. Some yellow mucus  mixed in . No flare in cough or ER/urgent care ov since last ov. No fever or hemoptysis. Doing well until last ov until last week.       Past Medical History:  Pulmonary embolism 1967  PUD sp perf 1967  Prostate ca 2003  Depression 1999  GERD with Barretts...................................Marland KitchenBuccini  - EGD 2009 Pos > EGD 04/18/10 : "ok" per verbal report  Hyperlipidemia  Chronic cough.............................................Marland KitchenWert  - Onset Oct 2010, better after prednisone short course 12/2009  - Sinus CT 11/06/10 Bilateral maxillary, ethmoid and possibly frontal sinusitis > 21 days augmentin >    f/u ct sinus ordered 01/12/2011 >>improved  --RAST neg, IGE  211 COPD - GOLD II  - PFT's 06/12/09 FEV1 2.25 (65%) ratio 62 and no better with B2  SPN RMl  - CT Chest 11/05/2009 > scheduled f/u 11/06/2010 > LLLAS Dz > cleared 01/12/2011 p 3 weeks augmentin          Review of Systems Constitutional:   No  weight loss, night sweats,  Fevers, chills, fatigue, or  lassitude.  HEENT:   No headaches,  Difficulty swallowing,  Tooth/dental problems, or  Sore throat,                No sneezing, itching, ear ache,  +nasal congestion, post nasal drip,   CV:  No chest pain,  Orthopnea, PND, swelling in lower extremities, anasarca, dizziness, palpitations, syncope.   GI  No heartburn, indigestion, abdominal pain, nausea, vomiting, diarrhea, change in bowel habits, loss of appetite, bloody stools.   Resp:    No coughing up of blood.    No chest wall deformity  Skin: no rash or lesions.  GU: no dysuria, change in color of urine, no urgency or frequency.  No flank pain, no hematuria   MS:  No joint pain or swelling.  No decreased range of motion.  No back pain.  Psych:  No change in mood or affect. No depression or anxiety.  No memory loss.          Objective:   Physical Exam      wt 234 Jan 08, 2010 > > 245 September 08, 2010 >>246 October 30, 2010 > 244 November 07, 2010 > 241 12/01/10> 246 01/12/2011 >248 04/29/2011  amb somber wm nad  HEENT: nl dentition, turbinates, and orophanx. Nl external ear canals without cough reflex  NECK : without JVD/Nodes/TM/ nl carotid upstrokes bilaterally  LUNGS: no acc muscle use, insp and exp rhonchi bilaterally  CV: RRR no s3 or murmur or increase in P2, no edema  ABD: soft and nontender with nl excursion in the supine position. No bruits or organomegaly, bowel sounds nl  MS: warm without deformities, calf tenderness, cyanosis or clubbing     Assessment & Plan:

## 2011-04-29 NOTE — Patient Instructions (Signed)
Augmentin 875mg  Twice daily  For 7 days  Mucinex DM Twice daily  As needed  Cough/congestion  Prednisone taper over next week . Fluids and rest  Saline nasal rinses As needed   Tramadol As needed  For cough  Please contact office for sooner follow up if symptoms do not improve or worsen or seek emergency care  follow up Dr. Sherene Sires  As planned and As needed

## 2011-04-29 NOTE — Assessment & Plan Note (Signed)
Flare   Plan  Augmentin 875mg  Twice daily  For 7 days  Mucinex DM Twice daily  As needed  Cough/congestion  Prednisone taper over next week . Fluids and rest  Saline nasal rinses As needed   Tramadol As needed  For cough  Please contact office for sooner follow up if symptoms do not improve or worsen or seek emergency care  follow up Dr. Sherene Sires  As planned and As needed

## 2011-05-29 LAB — POCT HEMOGLOBIN-HEMACUE
Hemoglobin: 15.8
Operator id: 128471

## 2012-05-30 DIAGNOSIS — N529 Male erectile dysfunction, unspecified: Secondary | ICD-10-CM | POA: Insufficient documentation

## 2012-05-30 DIAGNOSIS — N35014 Post-traumatic urethral stricture, male, unspecified: Secondary | ICD-10-CM | POA: Insufficient documentation

## 2013-03-27 ENCOUNTER — Encounter: Payer: Self-pay | Admitting: Podiatry

## 2013-03-27 ENCOUNTER — Ambulatory Visit (INDEPENDENT_AMBULATORY_CARE_PROVIDER_SITE_OTHER): Payer: BLUE CROSS/BLUE SHIELD | Admitting: Podiatry

## 2013-03-27 VITALS — BP 134/81 | HR 65 | Ht 74.0 in | Wt 235.0 lb

## 2013-03-27 DIAGNOSIS — M722 Plantar fascial fibromatosis: Secondary | ICD-10-CM | POA: Insufficient documentation

## 2013-03-27 DIAGNOSIS — M216X9 Other acquired deformities of unspecified foot: Secondary | ICD-10-CM | POA: Insufficient documentation

## 2013-03-27 NOTE — Progress Notes (Signed)
Left heel and plantar lateral side hurts when gets up after sitting for a while x 1 month. Tender in the morning and get worse in the afternoon and at night. Still wearing 67 year old Orthotics (LRB).  Objective: Tight Achilles tendon bilateral. Left heel and lateral plantar surface pain. Mild digital contracture bilateral. No acute lesions. No edema or erythema noted.  Assessment: Plantar fasciitis left. Worn out Programmer, [employer].  Plan: Reviewed clinical findings and available options. 1/4" felt pad added to orthotics of both heels. Patient wants to wait on injection after trying the added heel pad.  After injection, may try new orthotics if the pain continues. Return as needed.

## 2013-03-29 ENCOUNTER — Other Ambulatory Visit: Payer: Self-pay | Admitting: Gastroenterology

## 2013-08-28 ENCOUNTER — Telehealth: Payer: Self-pay | Admitting: *Deleted

## 2013-08-28 NOTE — Telephone Encounter (Signed)
Pt request an rx for toenail fungus, told him I would ask Dr. Caffie Pinto and that he may have to come in.

## 2013-08-29 ENCOUNTER — Encounter: Payer: Self-pay | Admitting: Podiatry

## 2013-08-29 ENCOUNTER — Ambulatory Visit (INDEPENDENT_AMBULATORY_CARE_PROVIDER_SITE_OTHER): Payer: BLUE CROSS/BLUE SHIELD | Admitting: Podiatry

## 2013-08-29 VITALS — BP 126/74 | HR 52

## 2013-08-29 DIAGNOSIS — B351 Tinea unguium: Secondary | ICD-10-CM | POA: Insufficient documentation

## 2013-08-29 DIAGNOSIS — M79609 Pain in unspecified limb: Secondary | ICD-10-CM

## 2013-08-29 DIAGNOSIS — M79676 Pain in unspecified toe(s): Secondary | ICD-10-CM | POA: Insufficient documentation

## 2013-08-29 NOTE — Progress Notes (Signed)
Subjective:  68 year old male presents requesting medication for fungal nails.   Objective: Hypertrophic nails x 10. Subungual blister right great toe. Normal neurovascular status bilateral.  Assessment: Dystrophic nails both great toe. Subungual blister right great toe.  Plan: All nails debrided. Blister drained from right great toe nail. Cleansed and betadine dressing applied.  Compounding antifungal medication prescribed.

## 2013-08-29 NOTE — Patient Instructions (Signed)
Seen for fungal nails.  Compounding topical prescribed.

## 2013-11-28 ENCOUNTER — Ambulatory Visit: Payer: BLUE CROSS/BLUE SHIELD | Admitting: Podiatry

## 2013-12-05 ENCOUNTER — Ambulatory Visit (INDEPENDENT_AMBULATORY_CARE_PROVIDER_SITE_OTHER): Payer: BLUE CROSS/BLUE SHIELD | Admitting: Podiatry

## 2013-12-05 ENCOUNTER — Encounter: Payer: Self-pay | Admitting: Podiatry

## 2013-12-05 VITALS — BP 131/80 | HR 51

## 2013-12-05 DIAGNOSIS — B351 Tinea unguium: Secondary | ICD-10-CM

## 2013-12-05 DIAGNOSIS — M79606 Pain in leg, unspecified: Secondary | ICD-10-CM | POA: Insufficient documentation

## 2013-12-05 DIAGNOSIS — M79609 Pain in unspecified limb: Secondary | ICD-10-CM

## 2013-12-05 NOTE — Patient Instructions (Signed)
Seen for hypertrophic nails and calluses. All nails and calluses debrided. Return in 3 months or as needed.  

## 2013-12-05 NOTE — Progress Notes (Signed)
Subjective:  68 year old male presents requesting medication for fungal nails.  He is using compound antifungal medication and sees it is helping.   Objective:  Hypertrophic nails x 10.  Plantar calluses on both feet. Normal neurovascular status bilateral.   Assessment:  Dystrophic nails both great toe.  Plantar calluses.  Plan: All nails debrided.  Continue to use compounding antifungal medication.

## 2014-03-06 ENCOUNTER — Encounter: Payer: Self-pay | Admitting: Podiatry

## 2014-03-06 ENCOUNTER — Ambulatory Visit (INDEPENDENT_AMBULATORY_CARE_PROVIDER_SITE_OTHER): Payer: BLUE CROSS/BLUE SHIELD | Admitting: Podiatry

## 2014-03-06 VITALS — BP 128/74 | HR 60

## 2014-03-06 DIAGNOSIS — M79609 Pain in unspecified limb: Secondary | ICD-10-CM

## 2014-03-06 DIAGNOSIS — B351 Tinea unguium: Secondary | ICD-10-CM

## 2014-03-06 DIAGNOSIS — M79606 Pain in leg, unspecified: Secondary | ICD-10-CM

## 2014-03-06 NOTE — Progress Notes (Signed)
Subjective:  68 year old male presents requesting toe nails trimmed.   Objective:  Hypertrophic nails x 10.  Plantar calluses on both feet.  Normal neurovascular status bilateral.   Assessment:  Dystrophic nails both great toe.  Plantar calluses.   Plan: All nails and calluses debrided.

## 2014-03-06 NOTE — Patient Instructions (Signed)
Seen for hypertrophic nails. All nails debrided. Return in 3 months or as needed.  

## 2014-06-06 ENCOUNTER — Ambulatory Visit (INDEPENDENT_AMBULATORY_CARE_PROVIDER_SITE_OTHER): Payer: BLUE CROSS/BLUE SHIELD | Admitting: Podiatry

## 2014-06-06 ENCOUNTER — Encounter: Payer: Self-pay | Admitting: Podiatry

## 2014-06-06 VITALS — BP 120/75 | HR 56

## 2014-06-06 DIAGNOSIS — M79606 Pain in leg, unspecified: Secondary | ICD-10-CM

## 2014-06-06 DIAGNOSIS — B351 Tinea unguium: Secondary | ICD-10-CM

## 2014-06-06 NOTE — Progress Notes (Signed)
Subjective:  68 year old male presents requesting toe nails trimmed. Pain in toes with thick nails and calluses.   Objective:  Hypertrophic nails x 10.  Plantar calluses under 2nd MPJ right, under 5th MPJ left.   Normal neurovascular status bilateral.   Assessment:  Dystrophic nails both great toe.  Plantar calluses bilateral. Painful feet.   Plan: All nails and calluses debrided.

## 2014-06-06 NOTE — Patient Instructions (Signed)
Seen for hypertrophic nails. All nails debrided. Return in 3 months or as needed.  

## 2014-09-03 DIAGNOSIS — R972 Elevated prostate specific antigen [PSA]: Secondary | ICD-10-CM | POA: Insufficient documentation

## 2014-09-07 ENCOUNTER — Encounter: Payer: Self-pay | Admitting: Podiatry

## 2014-09-07 ENCOUNTER — Ambulatory Visit (INDEPENDENT_AMBULATORY_CARE_PROVIDER_SITE_OTHER): Payer: BLUE CROSS/BLUE SHIELD | Admitting: Podiatry

## 2014-09-07 VITALS — BP 120/82 | HR 74

## 2014-09-07 DIAGNOSIS — M79606 Pain in leg, unspecified: Secondary | ICD-10-CM

## 2014-09-07 DIAGNOSIS — B351 Tinea unguium: Secondary | ICD-10-CM

## 2014-09-07 NOTE — Progress Notes (Signed)
Subjective:  69 year old male presents requesting toe nails trimmed. Pain in toes with thick nails and calluses.  No new problems.   Objective:  Hypertrophic nails x 10.  Plantar calluses under 2nd MPJ right, under 5th MPJ left.  Normal neurovascular status bilateral.   Assessment:  Dystrophic nails both great toe.  Plantar calluses bilateral. Painful feet.   Plan: All nails and calluses debrided

## 2014-09-07 NOTE — Patient Instructions (Signed)
Seen for hypertrophic nails. All nails debrided. Return in 3 months or as needed.  

## 2014-12-07 ENCOUNTER — Ambulatory Visit: Payer: BLUE CROSS/BLUE SHIELD | Admitting: Podiatry

## 2014-12-07 ENCOUNTER — Encounter: Payer: Self-pay | Admitting: Podiatry

## 2014-12-07 ENCOUNTER — Ambulatory Visit (INDEPENDENT_AMBULATORY_CARE_PROVIDER_SITE_OTHER): Payer: Medicare Other | Admitting: Podiatry

## 2014-12-07 VITALS — BP 120/78 | HR 70 | Ht 74.0 in | Wt 240.0 lb

## 2014-12-07 DIAGNOSIS — M79606 Pain in leg, unspecified: Secondary | ICD-10-CM

## 2014-12-07 DIAGNOSIS — B351 Tinea unguium: Secondary | ICD-10-CM | POA: Diagnosis not present

## 2014-12-07 NOTE — Progress Notes (Signed)
Subjective:  69 year old male presents requesting toe nails trimmed. Had an incident jamming the right great toe and the toe nail is black now.   Objective:  Dark discolored right great toe nail bed. No associated redness or drainage noted. Hypertrophic nails x 10.  Plantar calluses under 2nd MPJ right, under 5th MPJ left.  Normal neurovascular status bilateral.   Assessment:  Discolored nail right hallux following injury.  Dystrophic nails both great toe.  Plantar calluses bilateral. Painful feet.   Plan: All nails and calluses debrided

## 2014-12-07 NOTE — Patient Instructions (Signed)
Seen for hypertrophic nails. All nails debrided. Return in 3 months or as needed.  

## 2015-02-22 ENCOUNTER — Other Ambulatory Visit: Payer: Self-pay | Admitting: Gastroenterology

## 2015-02-22 DIAGNOSIS — R1310 Dysphagia, unspecified: Secondary | ICD-10-CM

## 2015-02-26 ENCOUNTER — Ambulatory Visit
Admission: RE | Admit: 2015-02-26 | Discharge: 2015-02-26 | Disposition: A | Discharge status: Home / Self Care | Payer: Medicare Other | Source: Ambulatory Visit | Attending: Gastroenterology | Admitting: Gastroenterology

## 2015-02-26 DIAGNOSIS — R1310 Dysphagia, unspecified: Secondary | ICD-10-CM

## 2015-03-06 ENCOUNTER — Encounter: Payer: Self-pay | Admitting: Podiatry

## 2015-03-06 ENCOUNTER — Ambulatory Visit (INDEPENDENT_AMBULATORY_CARE_PROVIDER_SITE_OTHER): Payer: Medicare Other | Admitting: Podiatry

## 2015-03-06 VITALS — BP 142/81 | HR 60

## 2015-03-06 DIAGNOSIS — B351 Tinea unguium: Secondary | ICD-10-CM

## 2015-03-06 DIAGNOSIS — M79606 Pain in leg, unspecified: Secondary | ICD-10-CM

## 2015-03-06 NOTE — Patient Instructions (Signed)
Seen for hypertrophic nails. All nails debrided. Return in 3 months or as needed.  

## 2015-03-06 NOTE — Progress Notes (Signed)
Subjective:  69 year old male presents requesting toe nails trimmed.   Objective:  Hypertrophic nails x 10.  Plantar calluses under 2nd MPJ right, under 5th MPJ left.  Normal neurovascular status bilateral.   Assessment:  Dystrophic nails both great toe.  Plantar calluses bilateral. Painful feet.   Plan: All nails and calluses debrided

## 2015-03-07 ENCOUNTER — Other Ambulatory Visit: Payer: Self-pay | Admitting: Gastroenterology

## 2015-04-15 ENCOUNTER — Ambulatory Visit (INDEPENDENT_AMBULATORY_CARE_PROVIDER_SITE_OTHER)
Admission: RE | Admit: 2015-04-15 | Discharge: 2015-04-15 | Disposition: A | Discharge status: Home / Self Care | Payer: Medicare Other | Source: Ambulatory Visit | Attending: Internal Medicine | Admitting: Internal Medicine

## 2015-04-15 ENCOUNTER — Ambulatory Visit (INDEPENDENT_AMBULATORY_CARE_PROVIDER_SITE_OTHER): Payer: Medicare Other | Admitting: Internal Medicine

## 2015-04-15 ENCOUNTER — Encounter: Payer: Self-pay | Admitting: Internal Medicine

## 2015-04-15 VITALS — BP 122/66 | HR 56 | Ht 74.0 in | Wt 246.0 lb

## 2015-04-15 DIAGNOSIS — J449 Chronic obstructive pulmonary disease, unspecified: Secondary | ICD-10-CM | POA: Diagnosis not present

## 2015-04-15 DIAGNOSIS — R05 Cough: Secondary | ICD-10-CM

## 2015-04-15 DIAGNOSIS — E669 Obesity, unspecified: Secondary | ICD-10-CM | POA: Diagnosis not present

## 2015-04-15 DIAGNOSIS — K2270 Barrett's esophagus without dysplasia: Secondary | ICD-10-CM | POA: Diagnosis not present

## 2015-04-15 DIAGNOSIS — R059 Cough, unspecified: Secondary | ICD-10-CM

## 2015-04-15 MED ORDER — MOMETASONE FURO-FORMOTEROL FUM 100-5 MCG/ACT IN AERO: INHALATION_SPRAY | RESPIRATORY_TRACT | Status: DC

## 2015-04-15 NOTE — Progress Notes (Signed)
Subjective:    Patient ID: Carlos Romero, male    DOB: [birth year],  MRN: [id number]  HPI    04/15/2015  "new pt"  ov/ re: recurrent cough last seen 2012 with gerd/ sinus dz  Chief Complaint  Patient presents with  . Advice Only    last seen 12/2010-pt c/o sob, wheezing, prod cough with clear mucus X1 week.  pt takes tramadol to suppress cough.       Was doing fine on prn saba maybe once a month but worse sob/cough x one week and last  took one treatment noct before ov using 69 year old inhaler Onset was abrupt/ pattern is persistent cough/sob but  Does not keep him up at hs / no am flare / needs tramadol one daily to control cough  typically in pm after am ppi Assoc min nasal congestion/ no purulent sputum   No obvious day to day or daytime variability or assoc   cp or chest tightness,  overt sinus or hb symptoms. No unusual exp hx or h/o childhood pna/ asthma or knowledge of premature birth.  Sleeping ok without nocturnal  or early am exacerbation  of respiratory  c/o's or need for noct saba. Also denies any obvious fluctuation of symptoms with weather or environmental changes or other aggravating or alleviating factors except as outlined above   Current Medications, Allergies, Complete Past Medical History, Past Surgical History, Family History, and Social History were reviewed in [facility] record.  ROS  The following are not active complaints unless bolded sore throat, dysphagia, dental problems, itching, sneezing,  nasal congestion or excess/ purulent secretions, ear ache,   fever, chills, sweats, unintended wt loss, classically pleuritic or exertional cp, hemoptysis,  orthopnea pnd or leg swelling, presyncope, palpitations, abdominal pain, anorexia, nausea, vomiting, diarrhea  or change in bowel or bladder habits, change in stools or urine, dysuria,hematuria,  rash, arthralgias, visual complaints, headache, numbness, weakness or ataxia or problems with  walking or coordination,  change in mood/affect or memory.         Review of Systems  Constitutional: Negative for fever and unexpected weight change.  HENT: Positive for congestion. Negative for dental problem, ear pain, nosebleeds, postnasal drip, rhinorrhea, sinus pressure, sneezing, sore throat and trouble swallowing.   Eyes: Negative for redness and itching.  Respiratory: Positive for cough, shortness of breath and wheezing. Negative for chest tightness.   Cardiovascular: Negative for palpitations and leg swelling.  Gastrointestinal: Negative for nausea and vomiting.  Genitourinary: Negative for dysuria.  Musculoskeletal: Negative for joint swelling.  Skin: Negative for rash.  Neurological: Negative for headaches.  Hematological: Does not bruise/bleed easily.  Psychiatric/Behavioral: Negative for dysphoric mood. The patient is not nervous/anxious.        Objective:   Physical Exam  amb wm nad  Wt Readings from Last 3 Encounters:  04/15/15 246 lb (111.585 kg)  12/07/14 240 lb (108.863 kg)  03/27/13 235 lb (106.595 kg)    Vital signs reviewed   HEENT:  Full dentures  nl turbinates, and orophanx. Nl external ear canals without cough reflex   NECK :  without JVD/Nodes/TM/ nl carotid upstrokes bilaterally   LUNGS: no acc muscle use,  Min insp and exp rhonchi    CV:  RRR  no s3 or murmur or increase in P2, no edema   ABD:  soft and nontender with nl excursion in the supine position. No bruits or organomegaly, bowel sounds nl  MS:  warm without  deformities, calf tenderness, cyanosis or clubbing  SKIN: warm and dry without lesions    NEURO:  alert, approp, no deficits    I personally reviewed images and agree with radiology impression as follows:  Esophagram  02/26/15 1. Small hiatal hernia with significant spontaneous gastroesophageal reflux. 2. Barium pill lodges in the distal esophagus at the level of the hiatal hernia consistent with a short segment distal  esophageal stricture. 3. Some narrowing of the lumen of the lower cervical esophagus may be due to prominence of the cricopharyngeus muscle.    CXR PA and Lateral:   04/15/2015 :     I personally reviewed images and agree with radiology impression as follows:      The lungs are adequately inflated. The interstitial markings are coarse in the lower lung zones but this is stable. There is no alveolar infiltrate. There is no pleural effusion. The heart and pulmonary vascularity are normal. The mediastinum is normal in width. The bony thorax exhibits no acute abnormality.         Assessment & Plan:

## 2015-04-15 NOTE — Patient Instructions (Addendum)
Any time you are coughing /wheezing > prilosec 20 mg Take 30- 60 min before your first and last meals of the day   dulera 100 up to 2 puffs every 12 hours if needed  Work on perfecting  inhaler technique:  relax and gently blow all the way out then take a nice smooth deep breath back in, triggering the inhaler at same time you start breathing in.  Hold for up to 5 seconds if you can. Blow out thru nose. Rinse and gargle with water when done     GERD (REFLUX)  is an extremely common cause of respiratory symptoms just like yours , many times with no obvious heartburn at all.    It can be treated with medication, but also with lifestyle changes including elevation of the head of your bed (ideally with 6 inch  bed blocks),  Smoking cessation, avoidance of late meals, excessive alcohol, and avoid fatty foods, chocolate, peppermint, colas, red wine, and acidic juices such as orange juice.  NO MINT OR MENTHOL PRODUCTS SO NO COUGH DROPS  USE SUGARLESS CANDY INSTEAD (Jolley ranchers or Stover's or Life Savers) or even ice chips will also do - the key is to swallow to prevent all throat clearing. NO OIL BASED VITAMINS - use powdered substitutes.  Please remember to go to the  x-ray department downstairs for your tests - we will call you with the results when they are available.     If you are satisfied with your treatment plan,  let your doctor know and he/she can either refill your medications or you can return here when your prescription runs out.     If in any way you are not 100% satisfied,  please tell us.  If 100% better, tell your friends!  Pulmonary follow up is as needed

## 2015-04-16 ENCOUNTER — Encounter: Payer: Self-pay | Admitting: Internal Medicine

## 2015-04-16 DIAGNOSIS — E669 Obesity, unspecified: Secondary | ICD-10-CM | POA: Insufficient documentation

## 2015-04-16 NOTE — Assessment & Plan Note (Signed)
-   Onset Oct 2010, better after prednisone short course 12/2009  - Sinus CT 11/06/10 Bilateral maxillary, ethmoid and possibly frontal sinusitis > 21 days augmentin >    f/u ct sinus ordered 01/13/11> Improvement since the prior exam. The frontal sinuses, ethmoid sinus air cells and sphenoid sinuses as well as visualized portions of the mastoid air cells and middle ear cavities are clear. Minimal slightly polypoid mucosal thickening medial aspect of the left maxillary sinus. Partial clearing of the right maxillary sinus. Now noted is a 2.5 cm retention cyst or polyp within the anterior inferior aspect of the right maxillary sinus. - Allergy screen sent 01/12/2011  >  Eos 0.1   IgE  211 neg RAST   If cough pesists next step is repeat sinus CT

## 2015-04-16 NOTE — Assessment & Plan Note (Signed)
Body mass index is 31.57 kg/(m^2).  No results found for: TSH   Contributing to gerd tendency/ doe/reviewed need  achieve and maintain neg calorie balance > defer f/u primary care including intermittently monitoring thyroid status

## 2015-04-16 NOTE — Assessment & Plan Note (Signed)
02/26/15 esophagram  1. Small hiatal hernia with significant spontaneous gastroesophageal reflux. 2. Barium pill lodges in the distal esophagus at the level of the hiatal hernia consistent with a short segment distal esophageal stricture. 3. Some narrowing of the lumen of the lower cervical esophagus may be due to prominence of the cricopharyngeus muscle.  Reviewed with pt who is under the understanding he really doesn't have a problem > referred back to GI for f/u

## 2015-04-16 NOTE — Assessment & Plan Note (Addendum)
-    Quit smoking 1988 - PFT's 06/12/09 FEV1 2.25 (65%) ratio 62 and no better with B2   Not clear why symptoms now flaring. DDX of  difficult airways management all start with A and  include Adherence, Ace Inhibitors, Acid Reflux, Active Sinus Disease, Alpha 1 Antitripsin deficiency, Anxiety masquerading as Airways dz,  ABPA,  allergy(esp in young), Aspiration (esp in elderly), Adverse effects of meds,  Active smokers, A bunch of PE's (a small clot burden can't cause this syndrome unless there is already severe underlying pulm or vascular dz with poor reserve) plus two Bs  = Bronchiectasis and Beta blocker use..and one C= CHF   Adherence is always the initial "prime suspect" and is a multilayered concern that requires a "trust but verify" approach in every patient - starting with knowing how to use medications, especially inhalers, correctly, keeping up with refills and understanding the fundamental difference between maintenance and prns vs those medications only taken for a very short course and then stopped and not refilled.  - The proper method of use, as well as anticipated side effects, of a metered-dose inhaler are discussed and demonstrated to the patient. Improved effectiveness after extensive coaching during this visit to a level of approximately  75% from a baseline of 25% so ok to try dulera 100 2bid  ? Acid (or non-acid) GERD > always difficult to exclude as up to 75% of pts in some series report no assoc GI/ Heartburn symptoms (and he has documented spont gerd by Esophagram 02/26/15 > rec max (24h)  acid suppression and diet restrictions/ reviewed and instructions given in writing.   ? Allergy/ asthma > rx dulera 100 2bid prn flare after first rx gerd more aggressively since we already know he has sign spont gerd  ? Sinus dz > see cough a/p may need sinus ct next    I had an extended discussion with the patient reviewing all relevant studies completed to date and  lasting  1m    Each  maintenance medication was reviewed in detail including most importantly the difference between maintenance and prns and under what circumstances the prns are to be triggered using an action plan format that is not reflected in the computer generated alphabetically organized AVS.    Please see instructions for details which were reviewed in writing and the patient given a copy highlighting the part that I personally wrote and discussed at today's ov.

## 2015-06-06 ENCOUNTER — Encounter: Payer: Self-pay | Admitting: Podiatry

## 2015-06-06 ENCOUNTER — Ambulatory Visit (INDEPENDENT_AMBULATORY_CARE_PROVIDER_SITE_OTHER): Payer: Medicare Other | Admitting: Podiatry

## 2015-06-06 DIAGNOSIS — B351 Tinea unguium: Secondary | ICD-10-CM

## 2015-06-06 DIAGNOSIS — M79606 Pain in leg, unspecified: Secondary | ICD-10-CM | POA: Diagnosis not present

## 2015-06-06 NOTE — Progress Notes (Signed)
Subjective:  69 year old male presents requesting toe nails trimmed.   Objective:  Hypertrophic nails x 10.  Plantar calluses under 2nd MPJ right, under 5th MPJ left.  Normal neurovascular status bilateral.   Assessment:  Dystrophic nails both great toe.  Plantar calluses bilateral. Painful feet.   Plan: All nails and calluses debrided

## 2015-06-06 NOTE — Patient Instructions (Signed)
Seen for hypertrophic nails. All nails debrided. Return in 3 months or as needed.  

## 2015-06-07 ENCOUNTER — Ambulatory Visit: Payer: Medicare Other | Admitting: Podiatry

## 2015-09-05 ENCOUNTER — Ambulatory Visit: Payer: Medicare Other | Admitting: Podiatry

## 2015-10-14 DIAGNOSIS — N481 Balanitis: Secondary | ICD-10-CM | POA: Insufficient documentation

## 2016-01-06 ENCOUNTER — Ambulatory Visit (INDEPENDENT_AMBULATORY_CARE_PROVIDER_SITE_OTHER): Payer: Medicare Other | Admitting: Adult Health

## 2016-01-06 ENCOUNTER — Encounter: Payer: Self-pay | Admitting: Adult Health

## 2016-01-06 VITALS — BP 122/72 | HR 73 | Temp 97.9°F | Ht 74.0 in | Wt 260.0 lb

## 2016-01-06 DIAGNOSIS — J449 Chronic obstructive pulmonary disease, unspecified: Secondary | ICD-10-CM

## 2016-01-06 MED ORDER — BUDESONIDE-FORMOTEROL FUMARATE 160-4.5 MCG/ACT IN AERO: 2.0000 | INHALATION_SPRAY | Freq: Two times a day (BID) | RESPIRATORY_TRACT | Status: DC

## 2016-01-06 MED ORDER — AMOXICILLIN-POT CLAVULANATE 875-125 MG PO TABS: 1.0000 | ORAL_TABLET | Freq: Two times a day (BID) | ORAL | Status: AC

## 2016-01-06 MED ORDER — PREDNISONE 10 MG PO TABS: ORAL_TABLET | ORAL | Status: DC

## 2016-01-06 NOTE — Assessment & Plan Note (Signed)
Exacerbation with chronic cough   Plan  Augmentin 875mg  Twice daily  For 7 days -take with food.  Prednisone taper over next week.  Mucinex DM Twice daily  As needed  Cough/congestioni  Start Symbicort 2 puffs Twice daily  , rinse after use.  follow up Dr. Melvyn Novas  In 4-6 weeks with PFT  Please contact office for sooner follow up if symptoms do not improve or worsen or seek emergency care

## 2016-01-06 NOTE — Patient Instructions (Signed)
Augmentin 875mg  Twice daily  For 7 days -take with food.  Prednisone taper over next week.  Mucinex DM Twice daily  As needed  Cough/congestioni  Start Symbicort 2 puffs Twice daily  , rinse after use.  follow up Dr. Melvyn Novas  In 4-6 weeks with PFT  Please contact office for sooner follow up if symptoms do not improve or worsen or seek emergency care

## 2016-01-06 NOTE — Progress Notes (Signed)
Subjective:    Patient ID: Carlos Romero, male    DOB: [birth year],  MRN: [id number]  HPI    04/15/2015  "new pt"  ov/Wert re: recurrent cough last seen 2012 with gerd/ sinus dz  Chief Complaint  Patient presents with  . Advice Only    last seen 12/2010-pt c/o sob, wheezing, prod cough with clear mucus X1 week.  pt takes tramadol to suppress cough.       Was doing fine on prn saba maybe once a month but worse sob/cough x one week and last  took one treatment noct before ov using 70 year old inhaler Onset was abrupt/ pattern is persistent cough/sob but  Does not keep him up at hs / no am flare / needs tramadol one daily to control cough  typically in pm after am ppi Assoc min nasal congestion/ no purulent sputum  >>PPI /Dulera As needed  Cough   01/06/2016 Acute OV   : Cough  Pt present for acute office visit. Complains that cough has returned 4 months ago. Worse for last couple of weeks. Complains of  a dry cough, chest congestion, sinus pressure/drainage, SOB and wheezing. Feels like he has congestion but cant get up anything.  Not using Dulera, it is too expensive. Was given Symbicort which he is says helps but ran out of sample.  Last PFT in 70 showed FEV1 at 65%. Ratio 62.  He denies fever, chest pain, orthopnea, edema or hemoptysis .  Says he had chest xray in Feb and was told it was okay.     Current Medications, Allergies, Complete Past Medical History, Past Surgical History, Family History, and Social History were reviewed in [employer] record.  ROS  The following are not active complaints unless bolded sore throat, dysphagia, dental problems, itching, sneezing,  nasal congestion or excess/ purulent secretions, ear ache,   fever, chills, sweats, unintended wt loss, classically pleuritic or exertional cp, hemoptysis,  orthopnea pnd or leg swelling, presyncope, palpitations, abdominal pain, anorexia, nausea, vomiting, diarrhea  or change in bowel or  bladder habits, change in stools or urine, dysuria,hematuria,  rash, arthralgias, visual complaints, headache, numbness, weakness or ataxia or problems with walking or coordination,  change in mood/affect or memory.         Review of Systems  Constitutional: Negative for fever and unexpected weight change.  HENT: Negative for dental problem, ear pain, nosebleeds, postnasal drip, rhinorrhea, sinus pressure, sneezing, sore throat and trouble swallowing.   Eyes: Negative for redness and itching.  Respiratory: Positive for cough, shortness of breath and wheezing. Negative for chest tightness.   Cardiovascular: Negative for palpitations and leg swelling.  Gastrointestinal: Negative for nausea and vomiting.  Genitourinary: Negative for dysuria.  Musculoskeletal: Negative for joint swelling.  Skin: Negative for rash.  Neurological: Negative for headaches.  Hematological: Does not bruise/bleed easily.  Psychiatric/Behavioral: Negative for dysphoric mood. The patient is not nervous/anxious.        Objective:   Physical Exam  amb wm nad  Wt Readings from Last 3 Encounters:  04/15/15 246 lb (111.585 kg)  12/07/14 240 lb (108.863 kg)  03/27/13 235 lb (106.595 kg)    Vital signs reviewed  Filed Vitals:   01/06/16 1150  BP: 122/72  Pulse: 73  Temp: 97.9 F (36.6 C)  TempSrc: Oral  Height: 6\' 2"  (1.88 m)  Weight: 260 lb (117.935 kg)  SpO2: 93%     HEENT:  Full dentures  nl turbinates, and  orophanx. Nl external ear canals without cough reflex   NECK :  without JVD/Nodes/TM/ nl carotid upstrokes bilaterally   LUNGS: no acc muscle use,  Min insp and exp rhonchi    CV:  RRR  no s3 or murmur or increase in P2, no edema   ABD:  soft and nontender with nl excursion in the supine position. No bruits or organomegaly, bowel sounds nl  MS:  warm without deformities, calf tenderness, cyanosis or clubbing  SKIN: warm and dry without lesions    NEURO:  alert, approp, no  deficits     Esophagram  02/26/15 1. Small hiatal hernia with significant spontaneous gastroesophageal reflux. 2. Barium pill lodges in the distal esophagus at the level of the hiatal hernia consistent with a short segment distal esophageal stricture. 3. Some narrowing of the lumen of the lower cervical esophagus may be due to prominence of the cricopharyngeus muscle.    CXR PA and Lateral:   04/15/2015 :         The lungs are adequately inflated. The interstitial markings are coarse in the lower lung zones but this is stable. There is no alveolar infiltrate. There is no pleural effusion. The heart and pulmonary vascularity are normal. The mediastinum is normal in width. The bony thorax exhibits no acute abnormality.         Assessment & Plan:

## 2016-01-24 ENCOUNTER — Telehealth: Payer: Self-pay | Admitting: Internal Medicine

## 2016-01-24 MED ORDER — PREDNISONE 10 MG PO TABS
ORAL_TABLET | ORAL | Status: DC
Start: 2016-01-24 — End: 2016-03-12

## 2016-01-24 NOTE — Telephone Encounter (Signed)
Patient saw Rexene Edison a month ago and he was placed on antibiotics, prednisone, Symbicort and Mucinex.  He is only using Symbicort now.  Patient said that his cough went away after he took this regimen.  Patient says that now his cough has come back.   Pharmacy: [facility] [place]  Allergies  Allergen Reactions  . Morphine     REACTION: "feels crazy"  . Prozac [Fluoxetine Hcl] Anxiety    Patient Instructions     Augmentin 875mg  Twice daily For 7 days -take with food.  Prednisone taper over next week.  Mucinex DM Twice daily As needed Cough/congestioni  Start Symbicort 2 puffs Twice daily , rinse after use.  follow up Dr. Melvyn Novas In 4-6 weeks with PFT  Please contact office for sooner follow up if symptoms do not improve or worsen or seek emergency care

## 2016-01-24 NOTE — Telephone Encounter (Signed)
Spoke with pt. He is aware of MW's recommendations. Rx has been sent in. ROV has been scheduled for 01/28/2016 at 9:15am. Nothing further was needed.

## 2016-01-24 NOTE — Telephone Encounter (Signed)
Prednisone 10 mg take  4 each am x 2 days,   2 each am x 2 days,  1 each am x 2 days and stop   Ov with all meds in hand next week ok to add on

## 2016-01-28 ENCOUNTER — Ambulatory Visit (INDEPENDENT_AMBULATORY_CARE_PROVIDER_SITE_OTHER): Payer: Medicare Other | Admitting: Internal Medicine

## 2016-01-28 ENCOUNTER — Encounter: Payer: Self-pay | Admitting: Internal Medicine

## 2016-01-28 VITALS — BP 122/68 | HR 64 | Ht 73.0 in | Wt 265.0 lb

## 2016-01-28 DIAGNOSIS — R059 Cough, unspecified: Secondary | ICD-10-CM

## 2016-01-28 DIAGNOSIS — R05 Cough: Secondary | ICD-10-CM | POA: Diagnosis not present

## 2016-01-28 DIAGNOSIS — J449 Chronic obstructive pulmonary disease, unspecified: Secondary | ICD-10-CM

## 2016-01-28 MED ORDER — PANTOPRAZOLE SODIUM 40 MG PO TBEC: 40.0000 mg | DELAYED_RELEASE_TABLET | Freq: Every day | ORAL | Status: DC

## 2016-01-28 NOTE — Assessment & Plan Note (Signed)
-    Quit smoking 1988 - PFT's 06/12/09 FEV1 2.25 (65%) ratio 62 and no better with B2  - 04/15/2015   try dulera 100 Take 2 puffs first thing in am and then another 2 puffs about 12 hours later.  - flare of cough off dulera 12/2015  - flare of cough while on symbicort 160 01/23/2016    - 01/28/2016  After extensive coaching HFA effectiveness =    75% > continue symbicort 160 2 qam and second pm dose prn any resp symptoms

## 2016-01-28 NOTE — Patient Instructions (Signed)
Plan A = Automatic = Symbicort 160 2 puffs first thing in am and a second 2 pffs in evening if needed  Work on inhaler technique:  relax and gently blow all the way out then take a nice smooth deep breath back in, triggering the inhaler at same time you start breathing in.  Hold for up to 5 seconds if you can. Blow out thru nose. Rinse and gargle with water when done      Plan B = Backup Only use your albuterol as a rescue medication to be used if you can't catch your breath by resting or doing a relaxed purse lip breathing pattern.  - The less you use it, the better it will work when you need it. - Ok to use the inhaler up to 2 puffs  every 4 hours if you must but call for appointment if use goes up over your usual need - Don't leave home without it !!  (think of it like the spare tire for your car)   Start generic protonix 40 mg Take 30-60 min before first meal of the day and add prilosec otc 20 mg before supper if coughing at all   GERD (REFLUX)  is an extremely common cause of respiratory symptoms just like yours , many times with no obvious heartburn at all.    It can be treated with medication, but also with lifestyle changes including elevation of the head of your bed (ideally with 6 inch  bed blocks),  Smoking cessation, avoidance of late meals, excessive alcohol, and avoid fatty foods, chocolate, peppermint, colas, red wine, and acidic juices such as orange juice.  NO MINT OR MENTHOL PRODUCTS SO NO COUGH DROPS  USE SUGARLESS CANDY INSTEAD (Jolley ranchers or Stover's or Life Savers) or even ice chips will also do - the key is to swallow to prevent all throat clearing. NO OIL BASED VITAMINS - use powdered substitutes.     If you are satisfied with your treatment plan,  let your doctor know and he/she can either refill your medications or you can return here when your prescription runs out.     If in any way you are not 100% satisfied,  please tell us.  If 100% better, tell your  friends!  Pulmonary follow up is as needed

## 2016-01-28 NOTE — Progress Notes (Signed)
Subjective:    Patient ID: Carlos Romero, male    DOB: [birth year],  MRN: [id number]    Brief patient profile:  70 yowm quit smoking 1988 with GOLD II copd  Documented 2010      History of Present Illness  04/15/2015  "new pt"  ov/Wert re: recurrent cough last seen 2012 with gerd/ sinus dz  Chief Complaint  Patient presents with  . Advice Only    last seen 70/2012-pt c/o sob, wheezing, prod cough with clear mucus X1 week.  pt takes tramadol to suppress cough.    Was doing fine on prn saba maybe once a month but worse sob/cough x one week and last  took one treatment noct before ov using 70 year old inhaler Onset was abrupt/ pattern is persistent cough/sob but  Does not keep him up at hs / no am flare / needs tramadol one daily to control cough  typically in pm after am [facility] Ass[facility] min nasal congestion/ no purulent sputum  >>PPI /Dulera As needed  Cough   01/06/2016 NP Acute OV   : Cough off dulera   Pt present for acute office visit. Complains that cough has returned 4 months ago. Worse for last couple of weeks. Complains of  a dry cough, chest congestion, sinus pressure/drainage, SOB and wheezing. Feels like he has congestion but cant get up anything.  Not using Dulera, it is too expensive. Was given Symbicort which he is says helps but ran out of sample.  Last PFT in 2010 showed FEV1 at 65%. Ratio 62.  rec Augmentin 875mg  Twice daily  For 7 days -take with food.  Prednisone taper over next week.  Mucinex DM Twice daily  As needed  Cough/congestioni  Start Symbicort 2 puffs Twice daily  , rinse after use> 85% better     01/28/2016  f/u ov/Wert re: GOLD II copd/ AB/ recurrent cough on symb 160 [name] /otc ppi Chief Complaint  Patient presents with  . Follow-up    Pt called in on 01/24/15 with co's of cough x 1 day- pred taper called in and cough has now improved. He is coughing up clear sputum. He has noticed some wheezing.   still some coughing p meals then much worse since 01/23/16  assoc with sneezing  Not using any saba since back on symbicort   No obvious day to day or daytime variability or assoc cp or chest tightness,   or overt  hb symptoms. No unusual exp hx or h/o childhood pna/ asthma or knowledge of premature birth.  Sleeping ok without nocturnal  or early am exacerbation  of respiratory  c/o's or need for noct saba. Also denies any obvious fluctuation of symptoms with weather or environmental changes or other aggravating or alleviating factors except as outlined above   Current Medications, Allergies, Complete Past Medical History, Past Surgical History, Family History, and Social History were reviewed in [employer] record.  ROS  The following are not active complaints unless bolded sore throat, dysphagia, dental problems, itching, sneezing,  nasal congestion or excess/ purulent secretions, ear ache,   fever, chills, sweats, unintended wt loss, classically pleuritic or exertional cp, hemoptysis,  orthopnea pnd or leg swelling, presyncope, palpitations, abdominal pain, anorexia, nausea, vomiting, diarrhea  or change in bowel or bladder habits, change in stools or urine, dysuria,hematuria,  rash, arthralgias, visual complaints, headache, numbness, weakness or ataxia or problems with walking or coordination,  change in mood/affect or memory.  Objective:   Physical Exam  amb wm nad   01/28/2016          265   04/15/15 246 lb (111.585 kg)  12/07/14 240 lb (108.863 kg)  03/27/13 235 lb (106.595 kg)    Vital signs reviewed    HEENT:  Full dentures  nl turbinates, and orophanx. Nl external ear canals without cough reflex   NECK :  without JVD/Nodes/TM/ nl carotid upstrokes bilaterally   LUNGS: no acc muscle use,   Lungs clear bilaterally / no cough on insp /exp   CV:  RRR  no s3 or murmur or increase in P2, no edema   ABD:  Pot bellied bu soft and nontender with nl excursion in the supine position. No bruits  or organomegaly, bowel sounds nl  MS:  warm without deformities, calf tenderness, cyanosis or clubbing  SKIN: warm and dry without lesions    NEURO:  alert, approp, no deficits     Esophagram  02/26/15 1. Small hiatal hernia with significant spontaneous gastroesophageal reflux. 2. Barium pill lodges in the distal esophagus at the level of the hiatal hernia consistent with a short segment distal esophageal stricture. 3. Some narrowing of the lumen of the lower cervical esophagus may be due to prominence of the cricopharyngeus muscle.             Assessment & Plan:

## 2016-01-28 NOTE — Assessment & Plan Note (Signed)
-   Onset Oct 2010, better after prednisone short course 12/2009  - Sinus CT 11/06/10 Bilateral maxillary, ethmoid and possibly frontal sinusitis > 21 days augmentin >    f/u ct sinus ordered 01/13/11> Improvement since the prior exam. The frontal sinuses, ethmoid sinus air cells and sphenoid sinuses as well as visualized portions of the mastoid air cells and middle ear cavities are clear. Minimal slightly polypoid mucosal thickening medial aspect of the left maxillary sinus. Partial clearing of the right maxillary sinus. Now noted is a 2.5 cm retention cyst or polyp within the anterior inferior aspect of the right maxillary sinus. - Allergy screen sent 01/12/2011  >  Eos 0.1   IgE  211 neg RAST  Lack of cough resolution on a verified empirical regimen could mean an alternative diagnosis(asthmatic componnt) , persistence of the disease state (eg sinusitis or bronchiectasis) , or inadequacy of currently available therapy (eg no medical rx available for non-acid gerd)   For now focus on more aggressive gerd rx and topical ics to address asthmatic component (see separate a/p)  I had an extended discussion with the patient reviewing all relevant studies completed to date and  lasting 15 to 20 minutes of a 25 minute visit    Each maintenance medication was reviewed in detail including most importantly the difference between maintenance and prns and under what circumstances the prns are to be triggered using an action plan format that is not reflected in the computer generated alphabetically organized AVS.    Please see instructions for details which were reviewed in writing and the patient given a copy highlighting the part that I personally wrote and discussed at today's ov.

## 2016-02-11 ENCOUNTER — Other Ambulatory Visit: Payer: Self-pay | Admitting: Internal Medicine

## 2016-02-11 MED ORDER — BUDESONIDE-FORMOTEROL FUMARATE 160-4.5 MCG/ACT IN AERO: 2.0000 | INHALATION_SPRAY | Freq: Two times a day (BID) | RESPIRATORY_TRACT | Status: DC

## 2016-03-12 ENCOUNTER — Ambulatory Visit (INDEPENDENT_AMBULATORY_CARE_PROVIDER_SITE_OTHER): Payer: Medicare Other | Admitting: Internal Medicine

## 2016-03-12 ENCOUNTER — Encounter: Payer: Self-pay | Admitting: Internal Medicine

## 2016-03-12 VITALS — BP 126/74 | HR 60 | Ht 73.0 in | Wt 250.0 lb

## 2016-03-12 DIAGNOSIS — J449 Chronic obstructive pulmonary disease, unspecified: Secondary | ICD-10-CM

## 2016-03-12 LAB — PULMONARY FUNCTION TEST
DL/VA % pred: 79 %
DL/VA: 3.81 ml/min/mmHg/L
DLCO cor % pred: 62 %
DLCO cor: 22.74 ml/min/mmHg
DLCO unc % pred: 64 %
DLCO unc: 23.35 ml/min/mmHg
FEF 25-75 Post: 1.08 L/sec
FEF 25-75 Pre: 1.06 L/sec
FEF2575-%Change-Post: 1 %
FEF2575-%Pred-Post: 39 %
FEF2575-%Pred-Pre: 38 %
FEV1-%Change-Post: 2 %
FEV1-%Pred-Post: 58 %
FEV1-%Pred-Pre: 57 %
FEV1-Post: 2.14 L
FEV1-Pre: 2.09 L
FEV1FVC-%Change-Post: 0 %
FEV1FVC-%Pred-Pre: 81 %
FEV6-%Change-Post: 2 %
FEV6-%Pred-Post: 74 %
FEV6-%Pred-Pre: 72 %
FEV6-Post: 3.47 L
FEV6-Pre: 3.37 L
FEV6FVC-%Change-Post: 0 %
FEV6FVC-%Pred-Post: 101 %
FEV6FVC-%Pred-Pre: 101 %
FVC-%Change-Post: 2 %
FVC-%Pred-Post: 72 %
FVC-%Pred-Pre: 70 %
FVC-Post: 3.59 L
FVC-Pre: 3.48 L
Post FEV1/FVC ratio: 60 %
Post FEV6/FVC ratio: 97 %
Pre FEV1/FVC ratio: 60 %
Pre FEV6/FVC Ratio: 97 %
RV % pred: 128 %
RV: 3.36 L
TLC % pred: 91 %
TLC: 6.98 L

## 2016-03-12 NOTE — Progress Notes (Signed)
PFT done today. 

## 2016-03-12 NOTE — Patient Instructions (Signed)
No change in medications  Keep working on the weight loss   If you are satisfied with your treatment plan,  let your doctor know and he/she can either refill your medications or you can return here when your prescription runs out.     If in any way you are not 100% satisfied,  please tell us.  If 100% better, tell your friends!  Pulmonary follow up is as needed

## 2016-03-12 NOTE — Progress Notes (Signed)
Subjective:    Patient ID: Carlos Romero, male    DOB: [birth year]   MRN: [id number]    Brief patient profile:  70 yowm quit smoking 1988 with GOLD II copd  Documented 2010     History of Present Illness  04/15/2015  "new pt"  ov/ re: recurrent cough last seen 2012 with gerd/ sinus dz  Chief Complaint  Patient presents with  . Advice Only    last seen 12/2010-pt c/o sob, wheezing, prod cough with clear mucus X1 week.  pt takes tramadol to suppress cough.    Was doing fine on prn saba maybe once a month but worse sob/cough x one week and last  took one treatment noct before ov using 70 year old inhaler Onset was abrupt/ pattern is persistent cough/sob but  Does not keep him up at hs / no am flare / needs tramadol one daily to control cough  typically in pm after am [facility] min nasal congestion/ no purulent sputum  >>PPI /Dulera As needed  Cough   01/06/2016 NP Acute OV   : Cough off dulera   Pt present for acute office visit. Complains that cough has returned 4 months ago. Worse for last couple of weeks. Complains of  a dry cough, chest congestion, sinus pressure/drainage, SOB and wheezing. Feels like he has congestion but cant get up anything.  Not using Dulera, it is too expensive. Was given Symbicort which he is says helps but ran out of sample.  Last PFT in 2010 showed FEV1 at 65%. Ratio 62.  rec Augmentin 875mg  Twice daily  For 7 days -take with food.  Prednisone taper over next week.  Mucinex DM Twice daily  As needed  Cough/congestioni  Start Symbicort 2 puffs Twice daily  , rinse after use> 85% better     01/28/2016  f/u ov/ re: GOLD II copd/ AB/ recurrent cough on symb 160 [name] /otc ppi Chief Complaint  Patient presents with  . Follow-up    Pt called in on 01/24/15 with co's of cough x 1 day- pred taper called in and cough has now improved. He is coughing up clear sputum. He has noticed some wheezing.   still some coughing p meals then much worse since 01/23/16  assoc with sneezing  Not using any saba since back on symbicort  rec Plan A = Automatic = Symbicort 160 2 puffs first thing in am and a second 2 pffs in evening if needed Work on inhaler technique:  Plan B = Backup Only use your albuterol as a rescue medication Start generic protonix 40 mg Take 30-60 min before first meal of the day and add prilosec otc 20 mg before supper if coughing at all GERD    03/12/2016  f/u ov/ re: GOLD II copd on symb 160 2 puffs q am most days leaves off the pm dose / ppi qam  Chief Complaint  Patient presents with  . Follow-up    Breathing has improved and he is coughing much less. He rarely has to use albuterol.   able to do rowing machine x 15 m  Then treadmill x 15 min 3.37mph and varies some in grade MMRC1 = can walk nl pace, flat grade, can't hurry or go uphills or steps s some sob  But this is much better overall and rarely needs to stop or use saba now.  No obvious day to day or daytime variability or assoc cp or chest tightness,   or  overt  hb symptoms. No unusual exp hx or h/o childhood pna/ asthma or knowledge of premature birth.  Sleeping ok without nocturnal  or early am exacerbation  of respiratory  c/o's or need for noct saba. Also denies any obvious fluctuation of symptoms with weather or environmental changes or other aggravating or alleviating factors except as outlined above   Current Medications, Allergies, Complete Past Medical History, Past Surgical History, Family History, and Social History were reviewed in [employer] record.  ROS  The following are not active complaints unless bolded sore throat, dysphagia, dental problems, itching, sneezing,  nasal congestion or excess/ purulent secretions, ear ache,   fever, chills, sweats, unintended wt loss, classically pleuritic or exertional cp, hemoptysis,  orthopnea pnd or leg swelling, presyncope, palpitations, abdominal pain, anorexia, nausea, vomiting, diarrhea  or  change in bowel or bladder habits, change in stools or urine, dysuria,hematuria,  rash, arthralgias, visual complaints, headache, numbness, weakness or ataxia or problems with walking or coordination,  change in mood/affect or memory.                   Objective:   Physical Exam  amb wm nad  03/12/2016       250   01/28/2016        265   04/15/15 246 lb (111.585 kg)  12/07/14 240 lb (108.863 kg)  03/27/13 235 lb (106.595 kg)    Vital signs reviewed    HEENT:  Full dentures  nl turbinates, and orophanx. Nl external ear canals without cough reflex   NECK :  without JVD/Nodes/TM/ nl carotid upstrokes bilaterally   LUNGS: no acc muscle use,   Lungs clear bilaterally / no cough on insp /exp   CV:  RRR  no s3 or murmur or increase in P2, no edema   ABD:  Pot bellied bu soft and nontender with nl excursion in the supine position. No bruits or organomegaly, bowel sounds nl  MS:  warm without deformities, calf tenderness, cyanosis or clubbing  SKIN: warm and dry without lesions    NEURO:  alert, approp, no deficits     Esophagram  02/26/15 1. Small hiatal hernia with significant spontaneous gastroesophageal reflux. 2. Barium pill lodges in the distal esophagus at the level of the hiatal hernia consistent with a short segment distal esophageal stricture. 3. Some narrowing of the lumen of the lower cervical esophagus may be due to prominence of the cricopharyngeus muscle.             Assessment & Plan:   Outpatient Encounter Prescriptions as of 03/12/2016  Medication Sig  . albuterol (VENTOLIN HFA) 108 (90 Base) MCG/ACT inhaler Inhale 2 puffs into the lungs every 6 (six) hours as needed for wheezing or shortness of breath.  . budesonide-formoterol (SYMBICORT) 160-4.5 MCG/ACT inhaler Inhale 2 puffs into the lungs 2 (two) times daily.  . cetirizine (ZYRTEC) 10 MG tablet Take 10 mg by mouth daily.  Marland Kitchen loperamide (IMODIUM A-D) 2 MG tablet Take 2 mg by mouth 4 (four)  times daily as needed for diarrhea or loose stools.  Marland Kitchen omeprazole (PRILOSEC) 20 MG capsule Take 20 mg by mouth daily as needed.   . pantoprazole (PROTONIX) 40 MG tablet Take 1 tablet (40 mg total) by mouth daily. Take 30-60 min before first meal of the day  . valACYclovir (VALTREX) 1000 MG tablet as directed.   . [DISCONTINUED] predniSONE (DELTASONE) 10 MG tablet 4 tabs for 2 days, then 3 tabs for  2 days, 2 tabs for 2 days, then 1 tab for 2 days, then stop   No facility-administered encounter medications on file as of 03/12/2016.

## 2016-03-13 NOTE — Assessment & Plan Note (Signed)
-    Quit smoking 1988 - PFT's 06/12/09 FEV1 2.25 (65%) ratio 62 and no better with B2  - 04/15/2015   try dulera 100 Take 2 puffs first thing in am and then another 2 puffs about 12 hours later.  - flare of cough off dulera 12/2015  - flare of cough while on symbicort 160 01/23/2016  - 01/28/2016    continue symbicort 160 2 qam and second pm dose prn any resp symptoms  - PFT's  03/12/2016  FEV1 2.14 (58 % ) ratio 60  p 2 % improvement from saba p nothing prior to study with DLCO  64/62 % corrects to 79  % for alv volume    - 03/12/2016  After extensive coaching HFA effectiveness =    90%    I had an extended final summary discussion with the patient reviewing all relevant studies completed to date and  lasting 15 to 20 minutes of a 25 minute visit on the following issues:     So he only has moderate dz and is reconditioning very well at this point and benefiting from intentional wt loss to point where he only needs minimal  maint meds and no significant albuterol so  f/u here can be prn at this point  Each maintenance medication was reviewed in detail including most importantly the difference between maintenance and as needed and under what circumstances the prns are to be used.  Please see instructions for details which were reviewed in writing and the patient given a copy.

## 2016-04-03 ENCOUNTER — Telehealth: Payer: Self-pay | Admitting: Internal Medicine

## 2016-04-03 NOTE — Telephone Encounter (Signed)
Patient states he started coughing again.  Patient states he is coughing up clear phlegm.  Allergies  Allergen Reactions  . Morphine     REACTION: "feels crazy"  . Prozac [Fluoxetine Hcl] Anxiety    Pharmacy: [place]  Instructions   No change in medications  Keep working on the weight loss   If you are satisfied with your treatment plan,  let your doctor know and he/she can either refill your medications or you can return here when your prescription runs out.     If in any way you are not 100% satisfied,  please tell us.  If 100% better, tell your friends!  Pulmonary follow up is as needed

## 2016-04-03 NOTE — Telephone Encounter (Signed)
Can't treat this over the phone x to rec delsym or mucinex dm otc but happy to see next week as a work in prn

## 2016-04-03 NOTE — Telephone Encounter (Signed)
Pt aware of recs.  Scheduled ov for next Friday.  Nothing further needed.

## 2016-04-10 ENCOUNTER — Ambulatory Visit: Payer: Medicare Other | Admitting: Internal Medicine

## 2016-05-07 ENCOUNTER — Ambulatory Visit (INDEPENDENT_AMBULATORY_CARE_PROVIDER_SITE_OTHER): Payer: Medicare Other | Admitting: Internal Medicine

## 2016-05-07 ENCOUNTER — Encounter: Payer: Self-pay | Admitting: Internal Medicine

## 2016-05-07 VITALS — BP 104/70 | HR 95 | Ht 74.0 in | Wt 248.6 lb

## 2016-05-07 DIAGNOSIS — R059 Cough, unspecified: Secondary | ICD-10-CM

## 2016-05-07 DIAGNOSIS — R05 Cough: Secondary | ICD-10-CM | POA: Diagnosis not present

## 2016-05-07 DIAGNOSIS — J449 Chronic obstructive pulmonary disease, unspecified: Secondary | ICD-10-CM

## 2016-05-07 NOTE — Patient Instructions (Addendum)
For cough mucinex dm up to 1200 mg every 12 hours  For drainage / throat tickle try take CHLORPHENIRAMINE  4 mg - take one every 4 hours as needed - available over the counter- may cause drowsiness so start with just a bedtime dose or two and see how you tolerate it before trying in daytime     Anytime your cough starts up and you need the mucinex dm, take prilosec 30 min before supper   If not 100% better next step is sinus CT - call to schedule

## 2016-05-07 NOTE — Progress Notes (Signed)
Subjective:    Patient ID: Carlos Romero, male    DOB: [birth year]   MRN: [id number]    Brief patient profile:  58 yowm quit smoking 1988 with GOLD II copd  Documented 2010     History of Present Illness  04/15/2015  "new pt"  ov/ re: recurrent cough last seen 2012 with gerd/ sinus dz  Chief Complaint  Patient presents with  . Advice Only    last seen 12/2010-pt c/o sob, wheezing, prod cough with clear mucus X1 week.  pt takes tramadol to suppress cough.    Was doing fine on prn saba maybe once a month but worse sob/cough x one week and last  took one treatment noct before ov using 70 year old inhaler Onset was abrupt/ pattern is persistent cough/sob but  Does not keep him up at hs / no am flare / needs tramadol one daily to control cough  typically in pm after am [facility] min nasal congestion/ no purulent sputum  >>PPI /Dulera As needed  Cough   01/06/2016 NP Acute OV   : Cough off dulera   Pt present for acute office visit. Complains that cough has returned 4 months ago. Worse for last couple of weeks. Complains of  a dry cough, chest congestion, sinus pressure/drainage, SOB and wheezing. Feels like he has congestion but cant get up anything.  Not using Dulera, it is too expensive. Was given Symbicort which he is says helps but ran out of sample.  Last PFT in 2010 showed FEV1 at 65%. Ratio 62.  rec Augmentin 875mg  Twice daily  For 7 days -take with food.  Prednisone taper over next week.  Mucinex DM Twice daily  As needed  Cough/congestioni  Start Symbicort 2 puffs Twice daily  , rinse after use> 85% better     01/28/2016  f/u ov/ re: GOLD II copd/ AB/ recurrent cough on symb 160 Carlos Romero /otc ppi Chief Complaint  Patient presents with  . Follow-up    Pt called in on 01/24/15 with co's of cough x 1 day- pred taper called in and cough has now improved. He is coughing up clear sputum. He has noticed some wheezing.   still some coughing p meals then much worse since 01/23/16  assoc with sneezing  Not using any saba since back on symbicort  rec Plan A = Automatic = Symbicort 160 2 puffs first thing in am and a second 2 pffs in evening if needed Work on inhaler technique:  Plan B = Backup Only use your albuterol as a rescue medication Start generic protonix 40 mg Take 30-60 min before first meal of the day and add prilosec otc 20 mg before supper if coughing at all GERD    03/12/2016  f/u ov/ re: GOLD II copd on symb 160 2 puffs q am most days leaves off the pm dose / ppi qam  Chief Complaint  Patient presents with  . Follow-up    Breathing has improved and he is coughing much less. He rarely has to use albuterol.   able to do rowing machine x 15 m  Then treadmill x 15 min 3.31mph and varies some in grade MMRC1 = can walk nl pace, flat grade, can't hurry or go uphills or steps s some sob  But this is much better overall and rarely needs to stop or use saba now. rec No change in medications keep working on the weight loss F/u is prn  Apr 03 2016 Phone call  cough > mucinex / delsym 100% better    05/07/2016 acute extended ov/ re: GOLD II copd/ maint: symb 160 2 bid / saba 3 x weekly  Chief Complaint  Patient presents with  . Acute Visit    Pt c/o cough and PND x 2 wks. He states this started out with sore throat, but this has since resolved. Cough is occ prod with clear sputum.    cough acutely with uri/ worse now after meals/ productive of a glob of clear mucus  Cough tends to be worse after supper / better p benadryl  Continues doe x mmrc1    No obvious day to day or daytime variability or assoc cp or chest tightness,   or overt  hb symptoms. No unusual exp hx or h/o childhood pna/ asthma or knowledge of premature birth.  Sleeping ok without nocturnal  or early am exacerbation  of respiratory  c/o's or need for noct saba. Also denies any obvious fluctuation of symptoms with weather or environmental changes or other aggravating or alleviating  factors except as outlined above   Current Medications, Allergies, Complete Past Medical History, Past Surgical History, Family History, and Social History were reviewed in [employer] record.  ROS  The following are not active complaints unless bolded sore throat, dysphagia, dental problems, itching, sneezing,  nasal congestion or excess/ purulent secretions, ear ache,   fever, chills, sweats, unintended wt loss, classically pleuritic or exertional cp, hemoptysis,  orthopnea pnd or leg swelling, presyncope, palpitations, abdominal pain, anorexia, nausea, vomiting, diarrhea  or change in bowel or bladder habits, change in stools or urine, dysuria,hematuria,  rash, arthralgias, visual complaints, headache, numbness, weakness or ataxia or problems with walking or coordination,  change in mood/affect or memory.                   Objective:   Physical Exam  amb wm nad  03/12/2016       250   01/28/2016        265   04/15/15 246 lb (111.585 kg)  12/07/14 240 lb (108.863 kg)  03/27/13 235 lb (106.595 kg)    Vital signs reviewed    HEENT:  Full dentures  nl turbinates, and orophanx. Nl external ear canals without cough reflex   NECK :  without JVD/Nodes/TM/ nl carotid upstrokes bilaterally   LUNGS: no acc muscle use,   Lungs clear bilaterally / no cough on insp /exp   CV:  RRR  no s3 or murmur or increase in P2, no edema   ABD:  Pot bellied bu soft and nontender with nl excursion in the supine position. No bruits or organomegaly, bowel sounds nl  MS:  warm without deformities, calf tenderness, cyanosis or clubbing  SKIN: warm and dry without lesions    NEURO:  alert, approp, no deficits     Esophagram  02/26/15 1. Small hiatal hernia with significant spontaneous gastroesophageal reflux. 2. Barium pill lodges in the distal esophagus at the level of the hiatal hernia consistent with a short segment distal esophageal stricture. 3. Some narrowing of  the lumen of the lower cervical esophagus may be due to prominence of the cricopharyngeus muscle.             Assessment & Plan:

## 2016-05-10 ENCOUNTER — Encounter: Payer: Self-pay | Admitting: Internal Medicine

## 2016-05-10 NOTE — Assessment & Plan Note (Signed)
-    Quit smoking 1988 - PFT's 06/12/09 FEV1 2.25 (65%) ratio 62 and no better with B2  - 04/15/2015   try dulera 100 Take 2 puffs first thing in am and then another 2 puffs about 12 hours later.  - flare of cough off dulera 12/2015  - flare of cough while on symbicort 160 01/23/2016  - 01/28/2016    continue symbicort 160 2 qam and second pm dose prn any resp symptoms  - PFT's  03/12/2016  FEV1 2.14 (58 % ) ratio 60  p 2 % improvement from saba p nothing prior to study with DLCO  64/62 % corrects to 79  % for alv volume   - 03/12/2016  After extensive coaching HFA effectiveness =    90%    Well compensated on just symb 160 2bid despite what should have been a trigger for aecopd ie URI  Reviewed in writing how to handle typical uri symptoms including max use of otc's and if not working to his satisfaction then we can see him here prn   I had an extended discussion with the patient reviewing all relevant studies completed to date and  lasting 15 to 20 minutes of a 25 minute visit    Each maintenance medication was reviewed in detail including most importantly the difference between maintenance and prns and under what circumstances the prns are to be triggered using an action plan format that is not reflected in the computer generated alphabetically organized AVS.    Please see instructions for details which were reviewed in writing and the patient given a copy highlighting the part that I personally wrote and discussed at today's ov.

## 2016-05-10 NOTE — Assessment & Plan Note (Signed)
-   Onset Oct 2010, better after prednisone short course 12/2009  - Sinus CT 11/06/10 Bilateral maxillary, ethmoid and possibly frontal sinusitis > 21 days augmentin >    f/u ct sinus ordered 01/13/11> Improvement since the prior exam. The frontal sinuses, ethmoid sinus air cells and sphenoid sinuses as well as visualized portions of the mastoid air cells and middle ear cavities are clear. Minimal slightly polypoid mucosal thickening medial aspect of the left maxillary sinus. Partial clearing of the right maxillary sinus. Now noted is a 2.5 cm retention cyst or polyp within the anterior inferior aspect of the right maxillary sinus. - Allergy screen sent 01/12/2011  >  Eos 0.1   IgE  211 neg RAST   Suspect this was just uri but could be evolving sinus dz again so low threshold to repeat sinus ct/ ent eval here

## 2016-06-15 ENCOUNTER — Telehealth: Payer: Self-pay | Admitting: Internal Medicine

## 2016-06-15 MED ORDER — PANTOPRAZOLE SODIUM 40 MG PO TBEC: 40.0000 mg | DELAYED_RELEASE_TABLET | Freq: Every day | ORAL | 3 refills | Status: DC

## 2016-06-15 NOTE — Telephone Encounter (Signed)
Spoke with the pt and verified msg  Rx was sent and nothing further needed

## 2016-10-04 DIAGNOSIS — J342 Deviated nasal septum: Secondary | ICD-10-CM | POA: Insufficient documentation

## 2016-10-04 DIAGNOSIS — J343 Hypertrophy of nasal turbinates: Secondary | ICD-10-CM | POA: Insufficient documentation

## 2016-10-04 DIAGNOSIS — J310 Chronic rhinitis: Secondary | ICD-10-CM | POA: Insufficient documentation

## 2017-02-03 ENCOUNTER — Other Ambulatory Visit: Payer: Self-pay | Admitting: Adult Health

## 2017-06-23 ENCOUNTER — Other Ambulatory Visit: Payer: Self-pay | Admitting: Internal Medicine

## 2017-08-04 ENCOUNTER — Other Ambulatory Visit: Payer: Self-pay

## 2017-08-04 DIAGNOSIS — I739 Peripheral vascular disease, unspecified: Secondary | ICD-10-CM

## 2017-08-11 ENCOUNTER — Encounter: Payer: Self-pay | Admitting: Vascular Surgery

## 2017-08-11 ENCOUNTER — Other Ambulatory Visit: Payer: Self-pay

## 2017-08-11 ENCOUNTER — Ambulatory Visit (INDEPENDENT_AMBULATORY_CARE_PROVIDER_SITE_OTHER): Payer: Medicare Other | Admitting: Vascular Surgery

## 2017-08-11 ENCOUNTER — Ambulatory Visit (HOSPITAL_COMMUNITY)
Admission: RE | Admit: 2017-08-11 | Discharge: 2017-08-11 | Disposition: A | Discharge status: Home / Self Care | Payer: Medicare Other | Source: Ambulatory Visit | Attending: Vascular Surgery | Admitting: Vascular Surgery

## 2017-08-11 VITALS — BP 120/89 | HR 79 | Temp 97.1°F | Resp 18 | Ht 74.0 in | Wt 257.0 lb

## 2017-08-11 DIAGNOSIS — I739 Peripheral vascular disease, unspecified: Secondary | ICD-10-CM

## 2017-08-11 DIAGNOSIS — Z87891 Personal history of nicotine dependence: Secondary | ICD-10-CM | POA: Diagnosis not present

## 2017-08-11 NOTE — Progress Notes (Signed)
Patient name: Carlos Romero MRN: [id number] DOB: 1946-03-04 Sex: male   REASON FOR CONSULT:    Peripheral vascular disease.  Consult is requested by Dr. Marisue Humble.   HPI:   Carlos Romero is a pleasant 71 y.o. male, who had a wellness screening study done at his home and was found to have a slightly abnormal ABI.  I do not have that report but according to the patient the ABI was 87% on the left and 90% on the right.  Just to be safe Dr. Marisue Humble felt that would be best to get vascular consultation.  I do not get any history of claudication, rest pain, or nonhealing ulcers.  He has no real risk factors for peripheral vascular disease.  He denies any history of diabetes, hypertension, hypercholesterolemia, family history of premature cardiovascular disease or tobacco use.  I have reviewed the records from the referring office.  Patient was seen on 02/25/2017.  The patient is followed with multiple medical issues including hypercholesterolemia, history of prostate cancer, Barrett's esophagus, history of adenomatous polyp of the colon and diverticulosis of the colon, gastroesophageal reflux disease, dysphagia, obesity, and COPD.  Past Medical History:  Diagnosis Date  . Barrett's esophagus   . Chronic cough    onset Oct 2010, better after prednisone short course 12-2009, sinus CT 11-06-10 bilateral maxillary, ethmoid and possibly frontal sinusitis>21 days augmentin  . COPD (chronic obstructive pulmonary disease) (HCC)    gold stage 2, PFTs 06-12-09 FEV1 2.25 (65%) ratio 62 and no better with B2  . Depression 1999  . GERD (gastroesophageal reflux disease)   . Hyperlipidemia   . Lung disease    LLL air space disease  . Prostate cancer ([place]) 2003  . PUD (peptic ulcer disease) 1967   s/p perf  . Pulmonary embolism ([place]) 1967  . Solitary lung nodule    RML    Family History  Problem Relation Age of Onset  . Cancer Mother        bladder  . Stroke Mother   . Emphysema Other       "everyone"  . Asthma Mother     SOCIAL HISTORY: Social History   Socioeconomic History  . Marital status: Married    Spouse name: Not on file  . Number of children: Not on file  . Years of education: Not on file  . Highest education level: Not on file  Social Needs  . Financial resource strain: Not on file  . Food insecurity - worry: Not on file  . Food insecurity - inability: Not on file  . Transportation needs - medical: Not on file  . Transportation needs - non-medical: Not on file  Occupational History  . Occupation: Retail buyer: Retired  Tobacco Use  . Smoking status: Former Smoker    Packs/day: 1.00    Years: 20.00    Pack years: 20.00    Types: Cigarettes    Last attempt to quit: 08/24/1986    Years since quitting: 30.9  . Smokeless tobacco: Never Used  Substance and Sexual Activity  . Alcohol use: No    Alcohol/week: 0.0 oz    Comment: none since 2010  . Drug use: Not on file  . Sexual activity: Not on file  Other Topics Concern  . Not on file  Social History Narrative  . Not on file    Allergies  Allergen Reactions  . Fluoxetine Other (See Comments)    Vivid dreams  .  Morphine     REACTION: "feels crazy"  . Morphine Sulfate Anxiety  . Prozac [Fluoxetine Hcl] Anxiety    Current Outpatient Medications  Medication Sig Dispense Refill  . albuterol (VENTOLIN HFA) 108 (90 Base) MCG/ACT inhaler Inhale 2 puffs into the lungs every 6 (six) hours as needed for wheezing or shortness of breath.    . budesonide-formoterol (SYMBICORT) 160-4.5 MCG/ACT inhaler Inhale 2 puffs into the lungs 2 (two) times daily. 3 Inhaler 3  . cetirizine (ZYRTEC) 10 MG tablet Take 10 mg by mouth daily.    . diphenhydrAMINE (BENADRYL) 25 MG tablet Take 25 mg by mouth at bedtime as needed for allergies.    Marland Kitchen loperamide (IMODIUM A-D) 2 MG tablet Take 2 mg by mouth 4 (four) times daily as needed for diarrhea or loose stools.    Marland Kitchen omeprazole (PRILOSEC) 20 MG capsule Take 20  mg by mouth daily as needed.     . pantoprazole (PROTONIX) 40 MG tablet TAKE ONE TABLET BY MOUTH ONCE DAILY 30 TO 60 MINUTES BEFORE FIRST MEAL OF THE DAY 90 tablet 0  . SYMBICORT 160-4.5 MCG/ACT inhaler INHALE 2 PUFFS INTO LUNGS TWICE DAILY 10.2 g 5  . valACYclovir (VALTREX) 1000 MG tablet as directed.      No current facility-administered medications for this visit.     REVIEW OF SYSTEMS:  [X]  denotes positive finding, [ ]  denotes negative finding Cardiac  Comments:  Chest pain or chest pressure:    Shortness of breath upon exertion:    Short of breath when lying flat:    Irregular heart rhythm:        Vascular    Pain in calf, thigh, or hip brought on by ambulation:    Pain in feet at night that wakes you up from your sleep:     Blood clot in your veins:    Leg swelling:         Pulmonary    Oxygen at home:    Productive cough:     Wheezing:  X       Neurologic    Sudden weakness in arms or legs:     Sudden numbness in arms or legs:     Sudden onset of difficulty speaking or slurred speech:    Temporary loss of vision in one eye:     Problems with dizziness:         Gastrointestinal    Blood in stool:     Vomited blood:         Genitourinary    Burning when urinating:     Blood in urine:        Psychiatric    Major depression:         Hematologic    Bleeding problems:    Problems with blood clotting too easily:        Skin    Rashes or ulcers:        Constitutional    Fever or chills:     PHYSICAL EXAM:   Vitals:   08/11/17 1556  BP: 120/89  Pulse: 79  Resp: 18  Temp: (!) 97.1 F (36.2 C)  TempSrc: Oral  SpO2: 92%  Weight: 257 lb (116.6 kg)  Height: 6\' 2"  (1.88 m)    GENERAL: The patient is a well-nourished male, in no acute distress. The vital signs are documented above. CARDIAC: There is a regular rate and rhythm.  VASCULAR: I do not detect carotid bruits. He has palpable femoral, popliteal,  dorsalis pedis, and posterior tibial pulses  bilaterally. He has no significant lower extremity swelling. PULMONARY: There is good air exchange bilaterally without wheezing or rales. ABDOMEN: Soft and non-tender with normal pitched bowel sounds.  I do not palpate an aneurysm. MUSCULOSKELETAL: There are no major deformities or cyanosis. NEUROLOGIC: No focal weakness or paresthesias are detected. SKIN: There are no ulcers or rashes noted. PSYCHIATRIC: The patient has a normal affect.  DATA:    ARTERIAL DOPPLER STUDY: I have independently interpreted his arterial Doppler study today.  On the right side there is a triphasic dorsalis pedis and posterior tibial signal.  ABIs 100%.  Toe pressure on the right is 145 mmHg.  On the left side there is a triphasic dorsalis pedis and posterior tibial signal.  ABI is 100%.  Toe pressure on the left is 110 mmHg.  MEDICAL ISSUES:   ABNORMAL NONINVASIVE STUDY: Although he is noninvasive study that was done at his home was slightly abnormal, By my exam and based on his noninvasive studies here I do not think that he has any evidence of significant peripheral vascular disease.  I have encouraged him to stay as active as possible.  We have also discussed the importance of nutrition.  Fortunately he is not a smoker.  I will be happy to see him back at any time if any new vascular issues arise.  Deitra Mayo [facility] [phone]

## 2017-11-26 ENCOUNTER — Ambulatory Visit: Payer: Medicare Other | Admitting: Adult Health

## 2017-11-26 ENCOUNTER — Encounter: Payer: Self-pay | Admitting: Adult Health

## 2017-11-26 ENCOUNTER — Ambulatory Visit (INDEPENDENT_AMBULATORY_CARE_PROVIDER_SITE_OTHER)
Admission: RE | Admit: 2017-11-26 | Discharge: 2017-11-26 | Disposition: A | Discharge status: Home / Self Care | Payer: Medicare Other | Source: Ambulatory Visit | Attending: Adult Health | Admitting: Adult Health

## 2017-11-26 DIAGNOSIS — J449 Chronic obstructive pulmonary disease, unspecified: Secondary | ICD-10-CM | POA: Diagnosis not present

## 2017-11-26 MED ORDER — AMOXICILLIN-POT CLAVULANATE 875-125 MG PO TABS: 1.0000 | ORAL_TABLET | Freq: Two times a day (BID) | ORAL | 0 refills | Status: AC

## 2017-11-26 MED ORDER — PREDNISONE 10 MG PO TABS: ORAL_TABLET | ORAL | 0 refills | Status: DC

## 2017-11-26 MED ORDER — BUDESONIDE-FORMOTEROL FUMARATE 160-4.5 MCG/ACT IN AERO: INHALATION_SPRAY | RESPIRATORY_TRACT | 5 refills | Status: DC

## 2017-11-26 MED ORDER — HYDROCOD POLST-CPM POLST ER 10-8 MG/5ML PO SUER: 5.0000 mL | Freq: Two times a day (BID) | ORAL | 0 refills | Status: DC | PRN

## 2017-11-26 MED ORDER — BUDESONIDE-FORMOTEROL FUMARATE 160-4.5 MCG/ACT IN AERO: 2.0000 | INHALATION_SPRAY | Freq: Two times a day (BID) | RESPIRATORY_TRACT | 0 refills | Status: DC

## 2017-11-26 NOTE — Addendum Note (Signed)
Addended by: Parke Poisson E on: 11/26/2017 05:15 PM   Modules accepted: Orders

## 2017-11-26 NOTE — Patient Instructions (Signed)
Chest xray today .  Augmentin 875mg  Twice daily  For 7 days -take with food.  Prednisone taper over next week.  Mucinex DM Twice daily  As needed  Cough/congestioni  Re-Start Symbicort 2 puffs Twice daily  , rinse after use.  follow up Dr. Melvyn Novas  In 3-4 months and As needed   Please contact office for sooner follow up if symptoms do not improve or worsen or seek emergency care

## 2017-11-26 NOTE — Assessment & Plan Note (Signed)
Exacerbation   Plan  Patient Instructions  Chest xray today .  Augmentin 875mg  Twice daily  For 7 days -take with food.  Prednisone taper over next week.  Mucinex DM Twice daily  As needed  Cough/congestioni  Re-Start Symbicort 2 puffs Twice daily  , rinse after use.  follow up Dr. Melvyn Novas  In 3-4 months and As needed   Please contact office for sooner follow up if symptoms do not improve or worsen or seek emergency care

## 2017-11-26 NOTE — Progress Notes (Signed)
@Patient  ID: Carlos Romero, male    DOB: Jan 21, 1946, 72 y.o.   MRN: [id number]  Chief Complaint  Patient presents with  . Acute Visit    COPD     Referring provider: Gaynelle Arabian, MD  HPI: 72 year old male former smoker followed for gold 2 COPD and asthmatic bronchitis  11/26/2017 Acute OV : COPD  Patient presents for an acute office visit. He complains of 2 weeks of cough, congestion , wheezing and sinus drainage. Using Zyrtec with minimal help.  Coughing up thick yellow mucus.  No chest pain, orthopnea, edema, or fever.  Was previously on Symbicort , ran out.  Was doing well until last month.  Last seen 2017 .    Allergies  Allergen Reactions  . Fluoxetine Other (See Comments)    Vivid dreams  . Morphine     REACTION: "feels crazy"  . Morphine Sulfate Anxiety  . Prozac [Fluoxetine Hcl] Anxiety    Immunization History  Administered Date(s) Administered  . Influenza Split 06/14/2014  . Influenza Whole 05/24/2009, 10/27/2010  . Influenza, High Dose Seasonal PF 06/24/2017  . Influenza,inj,Quad PF,6+ Mos 06/06/2015  . Pneumococcal Polysaccharide-23 04/14/2014    Past Medical History:  Diagnosis Date  . Barrett's esophagus   . Chronic cough    onset Oct 2010, better after prednisone short course 12-2009, sinus CT 11-06-10 bilateral maxillary, ethmoid and possibly frontal sinusitis>21 days augmentin  . COPD (chronic obstructive pulmonary disease) (HCC)    gold stage 2, PFTs 06-12-09 FEV1 2.25 (65%) ratio 62 and no better with B2  . Depression 1999  . GERD (gastroesophageal reflux disease)   . Hyperlipidemia   . Lung disease    LLL air space disease  . Prostate cancer ([place]) 2003  . PUD (peptic ulcer disease) 1967   s/p perf  . Pulmonary embolism ([place]) 1967  . Solitary lung nodule    RML    Tobacco History: Social History   Tobacco Use  Smoking Status Former Smoker  . Packs/day: 1.00  . Years: 20.00  . Pack years: 20.00  . Types: Cigarettes  . Last  attempt to quit: 08/24/1986  . Years since quitting: 31.2  Smokeless Tobacco Never Used   Counseling given: Not Answered   Outpatient Encounter Medications as of 11/26/2017  Medication Sig  . albuterol (VENTOLIN HFA) 108 (90 Base) MCG/ACT inhaler Inhale 2 puffs into the lungs every 6 (six) hours as needed for wheezing or shortness of breath.  . diphenhydrAMINE (BENADRYL) 25 MG tablet Take 25 mg by mouth at bedtime as needed for allergies.  Marland Kitchen loperamide (IMODIUM A-D) 2 MG tablet Take 2 mg by mouth 4 (four) times daily as needed for diarrhea or loose stools.  Marland Kitchen loratadine (CLARITIN) 10 MG tablet Take 10 mg by mouth daily.  Marland Kitchen omeprazole (PRILOSEC) 20 MG capsule Take 20 mg by mouth daily as needed.   . pantoprazole (PROTONIX) 40 MG tablet TAKE ONE TABLET BY MOUTH ONCE DAILY 30 TO 60 MINUTES BEFORE FIRST MEAL OF THE DAY  . valACYclovir (VALTREX) 1000 MG tablet as directed.   Marland Kitchen amoxicillin-clavulanate (AUGMENTIN) 875-125 MG tablet Take 1 tablet by mouth 2 (two) times daily for 7 days.  . budesonide-formoterol (SYMBICORT) 160-4.5 MCG/ACT inhaler Inhale 2 puffs into the lungs 2 (two) times daily. (Patient not taking: Reported on 11/26/2017)  . chlorpheniramine-HYDROcodone (TUSSIONEX PENNKINETIC ER) 10-8 MG/5ML SUER Take 5 mLs by mouth every 12 (twelve) hours as needed for cough.  . predniSONE (DELTASONE) 10 MG tablet 4  tabs for 2 days, then 3 tabs for 2 days, 2 tabs for 2 days, then 1 tab for 2 days, then stop  . SYMBICORT 160-4.5 MCG/ACT inhaler INHALE 2 PUFFS INTO LUNGS TWICE DAILY (Patient not taking: Reported on 11/26/2017)  . [DISCONTINUED] cetirizine (ZYRTEC) 10 MG tablet Take 10 mg by mouth daily.   No facility-administered encounter medications on file as of 11/26/2017.      Review of Systems  Constitutional:   No  weight loss, night sweats,  Fevers, chills, + fatigue, or  lassitude.  HEENT:   No headaches,  Difficulty swallowing,  Tooth/dental problems, or  Sore throat,                No  sneezing, itching, ear ache,  +nasal congestion, post nasal drip,   CV:  No chest pain,  Orthopnea, PND, swelling in lower extremities, anasarca, dizziness, palpitations, syncope.   GI  No heartburn, indigestion, abdominal pain, nausea, vomiting, diarrhea, change in bowel habits, loss of appetite, bloody stools.   Resp:   No chest wall deformity  Skin: no rash or lesions.  GU: no dysuria, change in color of urine, no urgency or frequency.  No flank pain, no hematuria   MS:  No joint pain or swelling.  No decreased range of motion.  No back pain.    Physical Exam  Pulse 97   Ht 6\' 2"  (1.88 m)   Wt 261 lb (118.4 kg)   SpO2 95%   BMI 33.51 kg/m   GEN: A/Ox3; pleasant , NAD, elderly    HEENT:  [place]/AT,  EACs-clear, TMs-wnl, NOSE-clear, THROAT-clear, no lesions, no postnasal drip or exudate noted.   NECK:  Supple w/ fair ROM; no JVD; normal carotid impulses w/o bruits; no thyromegaly or nodules palpated; no lymphadenopathy.    RESP  Few trace rhonchi ,  no accessory muscle use, no dullness to percussion  CARD:  RRR, no m/r/g, no peripheral edema, pulses intact, no cyanosis or clubbing.  GI:   Soft & nt; nml bowel sounds; no organomegaly or masses detected.   Musco: Warm bil, no deformities or joint swelling noted.   Neuro: alert, no focal deficits noted.    Skin: Warm, no lesions or rashes    Lab Results:  CBC   BNP No results found for: BNP  ProBNP No results found for: PROBNP  Imaging: No results found.   Assessment & Plan:   COPD GOLD II  Exacerbation   Plan  Patient Instructions  Chest xray today .  Augmentin 875mg  Twice daily  For 7 days -take with food.  Prednisone taper over next week.  Mucinex DM Twice daily  As needed  Cough/congestioni  Re-Start Symbicort 2 puffs Twice daily  , rinse after use.  follow up Dr. Melvyn Novas  In 3-4 months and As needed   Please contact office for sooner follow up if symptoms do not improve or worsen or seek emergency  care         Rexene Edison, NP 11/26/2017

## 2017-11-27 NOTE — Progress Notes (Signed)
Chart and office note reviewed in detail  > agree with a/p as outlined    

## 2017-12-20 ENCOUNTER — Telehealth: Payer: Self-pay | Admitting: Adult Health

## 2017-12-20 NOTE — Telephone Encounter (Signed)
Called and spoke with patient, advised him that he has an appointment scheduled on Thursday with a repeat cxr. Patient verbalized understanding. Nothing further needed.

## 2017-12-22 ENCOUNTER — Other Ambulatory Visit: Payer: Self-pay | Admitting: Adult Health

## 2017-12-22 DIAGNOSIS — J449 Chronic obstructive pulmonary disease, unspecified: Secondary | ICD-10-CM

## 2017-12-23 ENCOUNTER — Ambulatory Visit (INDEPENDENT_AMBULATORY_CARE_PROVIDER_SITE_OTHER)
Admission: RE | Admit: 2017-12-23 | Discharge: 2017-12-23 | Disposition: A | Discharge status: Home / Self Care | Payer: Medicare Other | Source: Ambulatory Visit | Attending: Adult Health | Admitting: Adult Health

## 2017-12-23 ENCOUNTER — Ambulatory Visit: Payer: Medicare Other | Admitting: Adult Health

## 2017-12-23 ENCOUNTER — Encounter: Payer: Self-pay | Admitting: Adult Health

## 2017-12-23 DIAGNOSIS — J449 Chronic obstructive pulmonary disease, unspecified: Secondary | ICD-10-CM | POA: Diagnosis not present

## 2017-12-23 DIAGNOSIS — J189 Pneumonia, unspecified organism: Secondary | ICD-10-CM | POA: Insufficient documentation

## 2017-12-23 MED ORDER — BUDESONIDE-FORMOTEROL FUMARATE 160-4.5 MCG/ACT IN AERO: 2.0000 | INHALATION_SPRAY | Freq: Two times a day (BID) | RESPIRATORY_TRACT | 0 refills | Status: DC

## 2017-12-23 NOTE — Assessment & Plan Note (Signed)
LLL PNA now resolved on Chest xray  No further abx indicated.  cXR reviewed independently and agree with impression .

## 2017-12-23 NOTE — Addendum Note (Signed)
Addended by: Parke Poisson E on: 12/23/2017 04:35 PM   Modules accepted: Orders

## 2017-12-23 NOTE — Progress Notes (Signed)
Chart and office note reviewed in detail along with available xrays/ labs > agree with a/p as outlined  

## 2017-12-23 NOTE — Progress Notes (Signed)
@Patient  ID: Carlos Romero, male    DOB: Nov 13, 1945, 72 y.o.   MRN: [id number]  Chief Complaint  Patient presents with  . Follow-up    PNA     Referring provider: Gaynelle Arabian, MD  HPI: 72 year old male former smoker followed for gold 2 COPD and asthmatic bronchitis  TEST   PFT's 06/12/09 FEV1 2.25 (65%) ratio 62 and no better with B2  PFT's  03/12/2016  FEV1 2.14 (58 % ) ratio 60  p 2 % improvement from saba p nothing prior to study with DLCO  64/62 % corrects to 79  % for alv volume    12/23/2017 Follow up: COPD /PNA  Patient presents for a 3-week follow-up.  Patient was seen last visit with a COPD exacerbation and possibly left lower lobe pneumonia.  He was treated with Augmentin x7 days and a prednisone taper.  He was also restarted on Symbicort twice daily.  Patient is feeling much better with decreased cough and congestion.  Still has some lingering mucus that comes and goes.  Denies any chest pain, orthopnea, fever, increased edema.  Chest x-ray today shows resolved pneumonia.  With chronic COPD changes    Allergies  Allergen Reactions  . Fluoxetine Other (See Comments)    Vivid dreams  . Morphine     REACTION: "feels crazy"  . Morphine Sulfate Anxiety  . Prozac [Fluoxetine Hcl] Anxiety    Immunization History  Administered Date(s) Administered  . Influenza Split 06/14/2014  . Influenza Whole 05/24/2009, 10/27/2010  . Influenza, High Dose Seasonal PF 06/24/2017  . Influenza,inj,Quad PF,6+ Mos 06/06/2015  . Pneumococcal Polysaccharide-23 04/14/2014    Past Medical History:  Diagnosis Date  . Barrett's esophagus   . Chronic cough    onset Oct 2010, better after prednisone short course 12-2009, sinus CT 11-06-10 bilateral maxillary, ethmoid and possibly frontal sinusitis>21 days augmentin  . COPD (chronic obstructive pulmonary disease) (HCC)    gold stage 2, PFTs 06-12-09 FEV1 2.25 (65%) ratio 62 and no better with B2  . Depression 1999  . GERD  (gastroesophageal reflux disease)   . Hyperlipidemia   . Lung disease    LLL air space disease  . Prostate cancer ([place]) 2003  . PUD (peptic ulcer disease) 1967   s/p perf  . Pulmonary embolism ([place]) 1967  . Solitary lung nodule    RML    Tobacco History: Social History   Tobacco Use  Smoking Status Former Smoker  . Packs/day: 1.00  . Years: 20.00  . Pack years: 20.00  . Types: Cigarettes  . Last attempt to quit: 08/24/1986  . Years since quitting: 31.3  Smokeless Tobacco Never Used   Counseling given: Not Answered   Outpatient Encounter Medications as of 12/23/2017  Medication Sig  . albuterol (VENTOLIN HFA) 108 (90 Base) MCG/ACT inhaler Inhale 2 puffs into the lungs every 6 (six) hours as needed for wheezing or shortness of breath.  Marland Kitchen b complex vitamins tablet Take 1 tablet by mouth daily.  . budesonide-formoterol (SYMBICORT) 160-4.5 MCG/ACT inhaler INHALE 2 PUFFS INTO LUNGS TWICE DAILY  . chlorpheniramine-HYDROcodone (TUSSIONEX PENNKINETIC ER) 10-8 MG/5ML SUER Take 5 mLs by mouth every 12 (twelve) hours as needed for cough.  . diphenhydrAMINE (BENADRYL) 25 MG tablet Take 25 mg by mouth at bedtime as needed for allergies.  Marland Kitchen loperamide (IMODIUM A-D) 2 MG tablet Take 2 mg by mouth 4 (four) times daily as needed for diarrhea or loose stools.  Marland Kitchen loratadine (CLARITIN) 10 MG tablet  Take 10 mg by mouth daily.  Marland Kitchen omeprazole (PRILOSEC) 20 MG capsule Take 20 mg by mouth daily as needed.   . pantoprazole (PROTONIX) 40 MG tablet TAKE ONE TABLET BY MOUTH ONCE DAILY 30 TO 60 MINUTES BEFORE FIRST MEAL OF THE DAY  . valACYclovir (VALTREX) 1000 MG tablet as directed.   . [DISCONTINUED] budesonide-formoterol (SYMBICORT) 160-4.5 MCG/ACT inhaler Inhale 2 puffs into the lungs 2 (two) times daily. (Patient not taking: Reported on 11/26/2017)  . [DISCONTINUED] budesonide-formoterol (SYMBICORT) 160-4.5 MCG/ACT inhaler Inhale 2 puffs into the lungs every 12 (twelve) hours.  . [DISCONTINUED] predniSONE  (DELTASONE) 10 MG tablet 4 tabs for 2 days, then 3 tabs for 2 days, 2 tabs for 2 days, then 1 tab for 2 days, then stop (Patient not taking: Reported on 12/23/2017)   No facility-administered encounter medications on file as of 12/23/2017.      Review of Systems  Constitutional:   No  weight loss, night sweats,  Fevers, chills, fatigue, or  lassitude.  HEENT:   No headaches,  Difficulty swallowing,  Tooth/dental problems, or  Sore throat,                No sneezing, itching, ear ache,  +nasal congestion, post nasal drip,   CV:  No chest pain,  Orthopnea, PND, swelling in lower extremities, anasarca, dizziness, palpitations, syncope.   GI  No heartburn, indigestion, abdominal pain, nausea, vomiting, diarrhea, change in bowel habits, loss of appetite, bloody stools.   Resp:      No chest wall deformity  Skin: no rash or lesions.  GU: no dysuria, change in color of urine, no urgency or frequency.  No flank pain, no hematuria   MS:  No joint pain or swelling.  No decreased range of motion.  No back pain.    Physical Exam  BP 126/84 (BP Location: Left Arm, Cuff Size: Normal)   Pulse 73   Ht 6\' 2"  (1.88 m)   Wt 263 lb 12.8 oz (119.7 kg)   SpO2 94%   BMI 33.87 kg/m   GEN: A/Ox3; pleasant , NAD, obese    HEENT:  [place]/AT,  EACs-clear, TMs-wnl, NOSE-clear, THROAT-clear, no lesions, no postnasal drip or exudate noted.   NECK:  Supple w/ fair ROM; no JVD; normal carotid impulses w/o bruits; no thyromegaly or nodules palpated; no lymphadenopathy.    RESP  Clear  P & A; w/o, wheezes/ rales/ or rhonchi. no accessory muscle use, no dullness to percussion  CARD:  RRR, no m/r/g, tr  peripheral edema, pulses intact, no cyanosis or clubbing.  GI:   Soft & nt; nml bowel sounds; no organomegaly or masses detected.   Musco: Warm bil, no deformities or joint swelling noted.   Neuro: alert, no focal deficits noted.    Skin: Warm, no lesions or rashes    Lab Results:  CBC   BNP No  results found for: BNP  ProBNP No results found for: PROBNP  Imaging:    Assessment & Plan:   COPD GOLD II  Recent exacerbation now resolving  Plan  Patient Instructions  Continue on Symbicort 2 puffs Twice daily , rinse after use.  Mucinex DM Twice daily  As needed  Cough/congestion  Follow up with Dr. Melvyn Novas  In 3-4 months and As needed        Pneumonia LLL PNA now resolved on Chest xray  No further abx indicated.  cXR reviewed independently and agree with impression .  Rexene Edison, NP 12/23/2017

## 2017-12-23 NOTE — Assessment & Plan Note (Signed)
Recent exacerbation now resolving  Plan  Patient Instructions  Continue on Symbicort 2 puffs Twice daily , rinse after use.  Mucinex DM Twice daily  As needed  Cough/congestion  Follow up with Dr. Melvyn Novas  In 3-4 months and As needed

## 2017-12-23 NOTE — Patient Instructions (Signed)
Continue on Symbicort 2 puffs Twice daily , rinse after use.  Mucinex DM Twice daily  As needed  Cough/congestion  Follow up with Dr. Melvyn Novas  In 3-4 months and As needed

## 2018-01-06 ENCOUNTER — Ambulatory Visit: Payer: Medicare Other | Admitting: Internal Medicine

## 2018-01-06 ENCOUNTER — Encounter: Payer: Self-pay | Admitting: Internal Medicine

## 2018-01-06 VITALS — BP 122/84 | HR 74 | Temp 98.3°F | Ht 74.0 in | Wt 261.2 lb

## 2018-01-06 DIAGNOSIS — J449 Chronic obstructive pulmonary disease, unspecified: Secondary | ICD-10-CM | POA: Diagnosis not present

## 2018-01-06 DIAGNOSIS — R05 Cough: Secondary | ICD-10-CM | POA: Diagnosis not present

## 2018-01-06 DIAGNOSIS — R059 Cough, unspecified: Secondary | ICD-10-CM

## 2018-01-06 MED ORDER — BUDESONIDE-FORMOTEROL FUMARATE 160-4.5 MCG/ACT IN AERO: 2.0000 | INHALATION_SPRAY | Freq: Two times a day (BID) | RESPIRATORY_TRACT | 0 refills | Status: DC

## 2018-01-06 MED ORDER — ACETAMINOPHEN-CODEINE #3 300-30 MG PO TABS: 1.0000 | ORAL_TABLET | ORAL | 0 refills | Status: AC | PRN

## 2018-01-06 MED ORDER — PREDNISONE 10 MG PO TABS: ORAL_TABLET | ORAL | 0 refills | Status: DC

## 2018-01-06 NOTE — Patient Instructions (Addendum)
For drainage / throat tickle try take CHLORPHENIRAMINE  4 mg - take one every 4 hours as needed - available over the counter- may cause drowsiness so start with just a  dose or two one hour before bed and see how you tolerate it before trying in daytime     Please see patient coordinator before you leave today  to schedule sinus CT    Prednisone 10 mg take  4 each am x 2 days,   2 each am x 2 days,  1 each am x 2 days and stop  Take delsym two tsp every 12 hours and supplement if needed with  Tylenol #3 up to 1-2 every 4 hours to suppress the urge to cough. Swallowing water and/or using ice chips/non mint and menthol containing candies (such as lifesavers or sugarless jolly ranchers) are also effective.  You should rest your voice and avoid activities that you know make you cough.  Once you have eliminated the cough for 3 straight days try reducing the Tylenol #3  first,  then the delsym as tolerated.     Please schedule a follow up office visit in 2 weeks, sooner if needed  with all medications /inhalers/ solutions in hand so we can verify exactly what you are taking. This includes all medications from all doctors and over the counters

## 2018-01-06 NOTE — Progress Notes (Signed)
Subjective:    Patient ID: Carlos Romero, male    DOB: 03-13-1946   MRN: [id number]    Brief patient profile:  4 yowm quit smoking 1988 with GOLD II copd  Documented 2010     History of Present Illness  04/15/2015  "new pt"  ov/ re: recurrent cough last seen 2012 with gerd/ sinus dz  Chief Complaint  Patient presents with  . Advice Only    last seen 12/2010-pt c/o sob, wheezing, prod cough with clear mucus X1 week.  pt takes tramadol to suppress cough.    Was doing fine on prn saba maybe once a month but worse sob/cough x one week and last  took one treatment noct before ov using 72 year old inhaler Onset was abrupt/ pattern is persistent cough/sob but  Does not keep him up at hs / no am flare / needs tramadol one daily to control cough  typically in pm after am ppi Assoc min nasal congestion/ no purulent sputum  >>PPI /Dulera As needed  Cough   01/06/2016 NP Acute OV   : Cough off dulera   Pt present for acute office visit. Complains that cough has returned 4 months ago. Worse for last couple of weeks. Complains of  a dry cough, chest congestion, sinus pressure/drainage, SOB and wheezing. Feels like he has congestion but cant get up anything.  Not using Dulera, it is too expensive. Was given Symbicort which he is says helps but ran out of sample.  Last PFT in 2010 showed FEV1 at 65%. Ratio 62.  rec Augmentin 875mg  Twice daily  For 7 days -take with food.  Prednisone taper over next week.  Mucinex DM Twice daily  As needed  Cough/congestioni  Start Symbicort 2 puffs Twice daily  , rinse after use> 85% better     01/28/2016  f/u ov/ re: GOLD II copd/ AB/ recurrent cough on symb 160 [name] /otc ppi Chief Complaint  Patient presents with  . Follow-up    Pt called in on 01/24/15 with co's of cough x 1 day- pred taper called in and cough has now improved. He is coughing up clear sputum. He has noticed some wheezing.   still some coughing p meals then much worse since 01/23/16  assoc with sneezing  Not using any saba since back on symbicort  rec Plan A = Automatic = Symbicort 160 2 puffs first thing in am and a second 2 pffs in evening if needed Work on inhaler technique:  Plan B = Backup Only use your albuterol as a rescue medication Start generic protonix 40 mg Take 30-60 min before first meal of the day and add prilosec otc 20 mg before supper if coughing at all GERD diet    11/26/17 Tammy NP rec  Augmentin 875mg  Twice daily  For 7 days -take with food.  Prednisone taper over next week.  Mucinex DM Twice daily  As needed  Cough/congestioni  Re-Start Symbicort 2 puffs Twice daily  , rinse after use.   01/06/2018  Acute ext ov/ re: cough and sob not much better since last ov / worse x 2 days Chief Complaint  Patient presents with  . Acute Visit    Pt c/o increased SOB and cough with clear sputum x 2 days. His cough is prod with clear sputum. He is not sleeping well due to cough and wheezing.   not doing as  Well since late march 2019  attributing to "allergy season"  With  cough/ sense of post drainage worst at hs  Assoc with sense of wheezing  No obvious other patterns in  day to day or daytime variability or assoc  purulent sputum or mucus plugs or hemoptysis or cp or chest tightness, or overt sinus or hb symptoms. No unusual exposure hx or h/o childhood pna/ asthma or knowledge of premature birth.   Also denies any obvious fluctuation of symptoms with weather or environmental changes or other aggravating or alleviating factors except as outlined above   Current Allergies, Complete Past Medical History, Past Surgical History, Family History, and Social History were reviewed in [employer] record.  ROS  The following are not active complaints unless bolded Hoarseness, sore throat, dysphagia, dental problems, itching, sneezing,  nasal congestion or discharge of excess mucus or purulent secretions, ear ache,   fever, chills, sweats,  unintended wt loss or wt gain, classically pleuritic or exertional cp,  orthopnea pnd or arm/hand swelling  or leg swelling, presyncope, palpitations, abdominal pain, anorexia, nausea, vomiting, diarrhea  or change in bowel habits or change in bladder habits, change in stools or change in urine, dysuria, hematuria,  rash, arthralgias, visual complaints, headache, numbness, weakness or ataxia or problems with walking or coordination,  change in mood or  memory.        Current Meds  Medication Sig  . albuterol (VENTOLIN HFA) 108 (90 Base) MCG/ACT inhaler Inhale 2 puffs into the lungs every 6 (six) hours as needed for wheezing or shortness of breath.  Marland Kitchen azelastine (ASTELIN) 0.1 % nasal spray 1 spray 2 (two) times daily.  Marland Kitchen b complex vitamins tablet Take 1 tablet by mouth daily.  . budesonide-formoterol (SYMBICORT) 160-4.5 MCG/ACT inhaler INHALE 2 PUFFS INTO LUNGS TWICE DAILY  . chlorpheniramine-HYDROcodone (TUSSIONEX PENNKINETIC ER) 10-8 MG/5ML SUER Take 5 mLs by mouth every 12 (twelve) hours as needed for cough.  . diphenhydrAMINE (BENADRYL) 25 MG tablet Take 25 mg by mouth at bedtime as needed for allergies.  Marland Kitchen loperamide (IMODIUM A-D) 2 MG tablet Take 2 mg by mouth 4 (four) times daily as needed for diarrhea or loose stools.  Marland Kitchen loratadine (CLARITIN) 10 MG tablet Take 10 mg by mouth daily.  Marland Kitchen omeprazole (PRILOSEC) 20 MG capsule Take 20 mg by mouth daily as needed.   . pantoprazole (PROTONIX) 40 MG tablet TAKE ONE TABLET BY MOUTH ONCE DAILY 30 TO 60 MINUTES BEFORE FIRST MEAL OF THE DAY  . valACYclovir (VALTREX) 1000 MG tablet as directed.                  Objective:   Physical Exam  amb wm nad  01/06/2018       261  03/12/2016       250   01/28/2016        265   04/15/15 246 lb (111.585 kg)  12/07/14 240 lb (108.863 kg)  03/27/13 235 lb (106.595 kg)    Vital signs reviewed - Note on arrival 02 sats  93% on RA    HEENT:  Full dentures   01/06/2018 Insp/exp rhonchi some upper airway  feature   HEENT: nl dentition, turbinates bilaterally, and oropharynx. Nl external ear canals without cough reflex   NECK :  without JVD/Nodes/TM/ nl carotid upstrokes bilaterally   LUNGS: no acc muscle use,  Nl contour chest  With insp / exp coarse rhonchi with some upper airway features    CV:  RRR  no s3 or murmur or increase in P2, and no  edema   ABD:  soft and nontender with nl inspiratory excursion in the supine position. No bruits or organomegaly appreciated, bowel sounds nl  MS:  Nl gait/ ext warm without deformities, calf tenderness, cyanosis or clubbing No obvious joint restrictions   SKIN: warm and dry without lesions    NEURO:  alert, approp, nl sensorium with  no motor or cerebellar deficits apparent.        Esophagram  02/26/15 1. Small hiatal hernia with significant spontaneous gastroesophageal reflux. 2. Barium pill lodges in the distal esophagus at the level of the hiatal hernia consistent with a short segment distal esophageal stricture. 3. Some narrowing of the lumen of the lower cervical esophagus may be due to prominence of the cricopharyngeus muscle.    I personally reviewed images and agree with radiology impression as follows:  CXR:   12/23/17 No evidence of active disease.  Negative for pneumonia.          Assessment & Plan:

## 2018-01-09 ENCOUNTER — Encounter: Payer: Self-pay | Admitting: Internal Medicine

## 2018-01-09 NOTE — Assessment & Plan Note (Signed)
-   Onset Oct 2010, better after prednisone short course 12/2009  - Sinus CT 11/06/10 Bilateral maxillary, ethmoid and possibly frontal sinusitis > 21 days augmentin >    f/u ct sinus ordered 01/13/11> Improvement since the prior exam.   - Allergy screen sent 01/12/2011  >  Eos 0.1   IgE  211 neg RAST - repeat sinus CT  Of the three most common causes of  Sub-acute / recurrent or chronic cough, only one (GERD)  can actually contribute to/ trigger  the other two (asthma and post nasal drip syndrome)  and perpetuate the cylce of cough.  While not intuitively obvious, many patients with chronic low grade reflux do not cough until there is a primary insult that disturbs the protective epithelial barrier and exposes sensitive nerve endings.   This is typically viral but can due to PNDS and  either may apply here.     The point is that once this occurs, it is difficult to eliminate the cycle  using anything but a maximally effective acid suppression regimen at least in the short run, accompanied by an appropriate diet to address non acid GERD and control / eliminate the cough itself for at least 3 days with tylenol #3 and 1st gen H1 blockers per guidelines

## 2018-01-09 NOTE — Assessment & Plan Note (Signed)
-    Quit smoking 1988 - PFT's 06/12/09 FEV1 2.25 (65%) ratio 62 and no better with B2  - 04/15/2015   try dulera 100 Take 2 puffs first thing in am and then another 2 puffs about 12 hours later.  - flare of cough off dulera 12/2015  - flare of cough while on symbicort 160 01/23/2016  - 01/28/2016    continue symbicort 160 2 qam and second pm dose prn any resp symptoms  - PFT's  03/12/2016  FEV1 2.14 (58 % ) ratio 60  p 2 % improvement from saba p nothing prior to study with DLCO  64/62 % corrects to 79  % for alv volume   -  01/06/2018  After extensive coaching inhaler device  effectiveness =    75%   DDX of  difficult airways management almost all start with A and  include Adherence, Ace Inhibitors, Acid Reflux, Active Sinus Disease, Alpha 1 Antitripsin deficiency, Anxiety masquerading as Airways dz,  ABPA,  Allergy(esp in young), Aspiration (esp in elderly), Adverse effects of meds,  Active smokers, A bunch of PE's (a small clot burden can't cause this syndrome unless there is already severe underlying pulm or vascular dz with poor reserve) plus two Bs  = Bronchiectasis and Beta blocker use..and one C= CHF   Adherence is always the initial "prime suspect" and is a multilayered concern that requires a "trust but verify" approach in every patient - starting with knowing how to use medications, especially inhalers, correctly, keeping up with refills and understanding the fundamental difference between maintenance and prns vs those medications only taken for a very short course and then stopped and not refilled.  - see hfa teaching - with all meds in hand using a trust but verify approach to confirm accurate Medication  Reconciliation The principal here is that until we are certain that the  patients are doing what we've asked, it makes no sense to ask them to do more.   ? Allergy /asthma > Prednisone 10 mg take  4 each am x 2 days,   2 each am x 2 days,  1 each am x 2 days and stop   ? Active sinus dz > sinus  ct   ? Acid (or non-acid) GERD > always difficult to exclude as up to 75% of pts in some series report no assoc GI/ Heartburn symptoms> rec continue max (24h)  acid suppression and diet restrictions/ reviewed      I had an extended discussion with the patient reviewing all relevant studies completed to date and  lasting 15 to 20 minutes of a 25 minute acute  visit    See device teaching which extended face to face time for this visit   Each maintenance medication was reviewed in detail including most importantly the difference between maintenance and prns and under what circumstances the prns are to be triggered using an action plan format that is not reflected in the computer generated alphabetically organized AVS.    Please see AVS for specific instructions unique to this visit that I personally wrote and verbalized to the the pt in detail and then reviewed with pt  by my nurse highlighting any  changes in therapy recommended at today's visit to their plan of care.

## 2018-01-12 ENCOUNTER — Telehealth: Payer: Self-pay | Admitting: Internal Medicine

## 2018-01-12 MED ORDER — PREDNISONE 10 MG PO TABS: ORAL_TABLET | ORAL | 0 refills | Status: DC

## 2018-01-12 NOTE — Telephone Encounter (Signed)
Prednisone 10 mg take  4 each am x 2 days,   2 each am x 2 days,  1 each am x 2 days and stop  

## 2018-01-12 NOTE — Telephone Encounter (Signed)
Ok to give another 6 days pred but move up the sinus CT to asap

## 2018-01-12 NOTE — Telephone Encounter (Signed)
Called and spoke to pt.  Pt stated that he completed prednisone taper today that was prescribed on 01/06/18. Pt states he did have some improvement but symptoms have not subsided.  Pt reports of prod cough with clear mucus, wheezing & sob with exertion x1.5w Denies fever, chills or sweats.  Pt is taking delsym bid, mucinex bid, Symbicort bid, chlor-trimeton 4mg  daily q4h and Prilosec daily.   MW please advise. Thanks.  01/06/18 AVS:   Instructions   For drainage / throat tickle try take CHLORPHENIRAMINE  4 mg - take one every 4 hours as needed - available over the counter- may cause drowsiness so start with just a  dose or two one hour before bed and see how you tolerate it before trying in daytime     Please see patient coordinator before you leave today  to schedule sinus CT    Prednisone 10 mg take  4 each am x 2 days,   2 each am x 2 days,  1 each am x 2 days and stop  Take delsym two tsp every 12 hours and supplement if needed with  Tylenol #3 up to 1-2 every 4 hours to suppress the urge to cough. Swallowing water and/or using ice chips/non mint and menthol containing candies (such as lifesavers or sugarless jolly ranchers) are also effective.  You should rest your voice and avoid activities that you know make you cough.  Once you have eliminated the cough for 3 straight days try reducing the Tylenol #3  first,  then the delsym as tolerated.     Please schedule a follow up office visit in 2 weeks, sooner if needed  with all medications /inhalers/ solutions in hand so we can verify exactly what you are taking. This includes all medications from all doctors and over the counters

## 2018-01-12 NOTE — Telephone Encounter (Signed)
Pt is aware of below message and voiced his understanding. Rx for Prednisone has been sent to preferred pharmacy. Nothing further is needed.  

## 2018-01-12 NOTE — Telephone Encounter (Signed)
To clarify - you want [place] to send in another pred taper like the one given to him on 01/06/18?

## 2018-01-20 ENCOUNTER — Ambulatory Visit (INDEPENDENT_AMBULATORY_CARE_PROVIDER_SITE_OTHER)
Admission: RE | Admit: 2018-01-20 | Discharge: 2018-01-20 | Disposition: A | Discharge status: Home / Self Care | Payer: Medicare Other | Source: Ambulatory Visit | Attending: Internal Medicine | Admitting: Internal Medicine

## 2018-01-20 DIAGNOSIS — R05 Cough: Secondary | ICD-10-CM

## 2018-01-20 DIAGNOSIS — R059 Cough, unspecified: Secondary | ICD-10-CM

## 2018-01-20 NOTE — Progress Notes (Signed)
LMTCB

## 2018-01-24 ENCOUNTER — Telehealth: Payer: Self-pay | Admitting: Internal Medicine

## 2018-01-24 NOTE — Telephone Encounter (Signed)
Called and spoke with patient regarding ENT f/u appt Pt advised that he made appt with Dr. Jerrell Belfast MD with ENT on [facility] in [place] for 02/07/18 Pt advised to cancel ov with MW on 01/26/18 Advised pt that I cancelled his upcoming appt with MW Pt advised the CT results needs faxed over to Dr. Wilburn Cornelia MD office  [place] [facility] phone [phone]; fax [phone] Faxed over results today attn Freda Munro. Nothing further needed at this time.

## 2018-01-24 NOTE — Telephone Encounter (Signed)
Called and spoke with patient, advised him of MW response on his CT results. Patient states that he does not want the ENT referral as he has someone that he sees. Will route this back to MW so that he is aware.    MW please advise, thank you.

## 2018-01-24 NOTE — Telephone Encounter (Signed)
Called patient, unable to reach left message to give us a call back. 

## 2018-01-24 NOTE — Telephone Encounter (Signed)
Pt is calling back [phone]

## 2018-01-24 NOTE — Telephone Encounter (Signed)
Attempted to call patient, no answer, left message to call back.  

## 2018-01-24 NOTE — Telephone Encounter (Signed)
Fine with me to make his own appt but make sure they know about his Ct and send me a copy of the eval

## 2018-01-24 NOTE — Telephone Encounter (Signed)
Pt is calling back [phone]

## 2018-01-25 NOTE — Progress Notes (Signed)
Left pt detailed msg. 

## 2018-01-26 ENCOUNTER — Ambulatory Visit: Payer: Medicare Other | Admitting: Internal Medicine

## 2018-02-07 DIAGNOSIS — J338 Other polyp of sinus: Secondary | ICD-10-CM | POA: Insufficient documentation

## 2018-02-07 DIAGNOSIS — J0141 Acute recurrent pansinusitis: Secondary | ICD-10-CM | POA: Insufficient documentation

## 2018-03-01 ENCOUNTER — Encounter (INDEPENDENT_AMBULATORY_CARE_PROVIDER_SITE_OTHER): Payer: Self-pay | Admitting: Orthopedic Surgery

## 2018-03-01 ENCOUNTER — Ambulatory Visit (INDEPENDENT_AMBULATORY_CARE_PROVIDER_SITE_OTHER): Payer: Medicare Other | Admitting: Orthopedic Surgery

## 2018-03-01 ENCOUNTER — Ambulatory Visit (INDEPENDENT_AMBULATORY_CARE_PROVIDER_SITE_OTHER): Payer: Medicare Other

## 2018-03-01 VITALS — Ht 74.0 in | Wt 261.0 lb

## 2018-03-01 DIAGNOSIS — M25512 Pain in left shoulder: Secondary | ICD-10-CM | POA: Diagnosis not present

## 2018-03-01 DIAGNOSIS — G8929 Other chronic pain: Secondary | ICD-10-CM

## 2018-03-01 DIAGNOSIS — M7542 Impingement syndrome of left shoulder: Secondary | ICD-10-CM

## 2018-03-01 MED ORDER — LIDOCAINE HCL 1 % IJ SOLN
5.0000 mL | INTRAMUSCULAR | Status: AC | PRN
  Administered 2018-03-01: 5 mL

## 2018-03-01 MED ORDER — METHYLPREDNISOLONE ACETATE 40 MG/ML IJ SUSP
40.0000 mg | INTRAMUSCULAR | Status: AC | PRN
  Administered 2018-03-01: 40 mg via INTRA_ARTICULAR

## 2018-03-01 NOTE — Progress Notes (Signed)
Office Visit Note   Patient: Carlos Romero           Date of Birth: 28-Jul-1946           MRN: [id number] Visit Date: 03/01/2018              Requested by: Gaynelle Arabian, MD [address] [place], [place] [address] PCP: Gaynelle Arabian, MD  Chief Complaint  Patient presents with  . Left Shoulder - Pain      HPI: Patient is a 72 year old gentleman who presents with several week history of left shoulder in the subacromial space.  Patient denies any specific injury he states he has decreased range of motion he states these had an injection in the subacromial space about 15 years ago which provided him complete relief.  Assessment & Plan: Visit Diagnoses:  1. Chronic left shoulder pain   2. Impingement syndrome of left shoulder     Plan: The left shoulder was injected in subacromial space from the posterior portal he tolerated this well follow-up as needed.  Follow-Up Instructions: Return if symptoms worsen or fail to improve.   Ortho Exam  Patient is alert, oriented, no adenopathy, well-dressed, normal affect, normal respiratory effort. Patient has essentially full range of motion of the left shoulder compared to the right he has pain with Neer and Hawkins impingement test.  The [facility] joint is nontender to palpation.  He is tender to palpation of the biceps tendon.  Imaging: Xr Shoulder Left  Result Date: 03/01/2018 2 view radiographs of the left shoulders shows appearing migration of the humeral head within the glenoid.  The [facility] joint is lung field shows no masses.  No images are attached to the encounter.  Labs: No results found for: HGBA1C, ESRSEDRATE, CRP, LABURIC, REPTSTATUS, GRAMSTAIN, CULT, LABORGA   Lab Results  Component Value Date   ALBUMIN 3.2 (L) 11/15/2010   ALBUMIN 3.7 02/22/2009    Body mass index is 33.51 kg/m.  Orders:  Orders Placed This Encounter  Procedures  . XR Shoulder Left   No orders of the defined types were placed in  this encounter.    Procedures: Large Joint Inj: L subacromial bursa on 03/01/2018 10:09 AM Indications: diagnostic evaluation and pain Details: 22 G 1.5 in needle, posterior approach  Arthrogram: No  Medications: 5 mL lidocaine 1 %; 40 mg methylPREDNISolone acetate 40 MG/ML Outcome: tolerated well, no immediate complications Procedure, treatment alternatives, risks and benefits explained, specific risks discussed. Consent was given by the patient. Immediately prior to procedure a time out was called to verify the correct patient, procedure, equipment, support staff and site/side marked as required. Patient was prepped and draped in the usual sterile fashion.      Clinical Data: No additional findings.  ROS:  All other systems negative, except as noted in the HPI. Review of Systems  Objective: Vital Signs: Ht 6\' 2"  (1.88 m)   Wt 261 lb (118.4 kg)   BMI 33.51 kg/m   Specialty Comments:  No specialty comments available.  PMFS History: Patient Active Problem List   Diagnosis Date Noted  . Pneumonia 12/23/2017  . Obesity (BMI 30-39.9) 04/16/2015  . Pain in lower limb 12/05/2013  . Onychomycosis 08/29/2013  . Pain in toe 08/29/2013  . Plantar fasciitis of left foot 03/27/2013  . Equinus deformity of foot, acquired 03/27/2013  . Acute sinusitis 11/07/2010  . PULMONARY NODULE 01/08/2010  . PROSTATE CANCER 12/12/2009  . HYPERCHOLESTEROLEMIA 12/12/2009  . DEPRESSION 12/12/2009  .  PULMONARY EMBOLISM 12/12/2009  . COPD GOLD II  12/12/2009  . GERD 12/12/2009  . BARRETTS ESOPHAGUS 12/12/2009  . Cough 12/12/2009  . PUD, HX OF 12/12/2009   Past Medical History:  Diagnosis Date  . Barrett's esophagus   . Chronic cough    onset Oct 2010, better after prednisone short course 12-2009, sinus CT 11-06-10 bilateral maxillary, ethmoid and possibly frontal sinusitis>21 days augmentin  . COPD (chronic obstructive pulmonary disease) (HCC)    gold stage 2, PFTs 06-12-09 FEV1 2.25 (65%)  ratio 62 and no better with B2  . Depression 1999  . GERD (gastroesophageal reflux disease)   . Hyperlipidemia   . Lung disease    LLL air space disease  . Prostate cancer ([place]) 2003  . PUD (peptic ulcer disease) 1967   s/p perf  . Pulmonary embolism ([place]) 1967  . Solitary lung nodule    RML    Family History  Problem Relation Age of Onset  . Cancer Mother        bladder  . Stroke Mother   . Emphysema Other        "everyone"  . Asthma Mother     Past Surgical History:  Procedure Laterality Date  . PROSTATECTOMY  1999   Social History   Occupational History  . Occupation: Retail buyer: Retired  Tobacco Use  . Smoking status: Former Smoker    Packs/day: 1.00    Years: 20.00    Pack years: 20.00    Types: Cigarettes    Last attempt to quit: 08/24/1986    Years since quitting: 31.5  . Smokeless tobacco: Never Used  Substance and Sexual Activity  . Alcohol use: No    Alcohol/week: 0.0 oz    Comment: none since 2010  . Drug use: Not on file  . Sexual activity: Not on file

## 2018-03-28 ENCOUNTER — Ambulatory Visit: Payer: Medicare Other | Admitting: Internal Medicine

## 2018-03-28 ENCOUNTER — Encounter: Payer: Self-pay | Admitting: Internal Medicine

## 2018-03-28 VITALS — BP 122/78 | HR 86 | Ht 74.0 in | Wt 262.0 lb

## 2018-03-28 DIAGNOSIS — K2270 Barrett's esophagus without dysplasia: Secondary | ICD-10-CM | POA: Diagnosis not present

## 2018-03-28 DIAGNOSIS — R059 Cough, unspecified: Secondary | ICD-10-CM

## 2018-03-28 DIAGNOSIS — R05 Cough: Secondary | ICD-10-CM | POA: Diagnosis not present

## 2018-03-28 DIAGNOSIS — J449 Chronic obstructive pulmonary disease, unspecified: Secondary | ICD-10-CM

## 2018-03-28 MED ORDER — BUDESONIDE-FORMOTEROL FUMARATE 160-4.5 MCG/ACT IN AERO: 2.0000 | INHALATION_SPRAY | Freq: Two times a day (BID) | RESPIRATORY_TRACT | 0 refills | Status: DC

## 2018-03-28 NOTE — Assessment & Plan Note (Signed)
-    Quit smoking 1988 - PFT's 06/12/09 FEV1 2.25 (65%) ratio 62 and no better with B2  - 04/15/2015   try dulera 100 Take 2 puffs first thing in am and then another 2 puffs about 12 hours later.  - flare of cough off dulera 12/2015  - flare of cough while on symbicort 160 01/23/2016  - 01/28/2016    continue symbicort 160 2 qam and second pm dose prn any resp symptoms  - PFT's  03/12/2016  FEV1 2.14 (58 % ) ratio 60  p 2 % improvement from saba p nothing prior to study with DLCO  64/62 % corrects to 79  % for alv volume    - 03/28/2018  After extensive coaching inhaler device  effectiveness =    90% > continue symb 160 2bid   Marked improvement in symptoms with control of rhinitis/ Not limited by breathing from desired activities  And no tendency now to aecopd so f/u q 6 m    I had an extended discussion with the patient reviewing all relevant studies completed to date and  lasting 15 to 20 minutes of a 25 minute visit    See device teaching which extended face to face time for this visit.  Each maintenance medication was reviewed in detail including emphasizing most importantly the difference between maintenance and prns and under what circumstances the prns are to be triggered using an action plan format that is not reflected in the computer generated alphabetically organized AVS which I have not found useful in most complex patients, especially with respiratory illnesses  Please see AVS for specific instructions unique to this visit that I personally wrote and verbalized to the the pt in detail and then reviewed with pt  by my nurse highlighting any  changes in therapy recommended at today's visit to their plan of care.

## 2018-03-28 NOTE — Assessment & Plan Note (Addendum)
02/26/15 esophagram  1. Small hiatal hernia with significant spontaneous gastroesophageal reflux. 2. Barium pill lodges in the distal esophagus at the level of the hiatal hernia consistent with a short segment distal esophageal stricture. 3. Some narrowing of the lumen of the lower cervical esophagus may be due to prominence of the cricopharyngeus muscle.    Continue protonix 40 mg qd ac and add prilosec before supper prn symptoms

## 2018-03-28 NOTE — Assessment & Plan Note (Signed)
-   Onset Oct 2010, better after prednisone short course 12/2009  - Sinus CT 11/06/10 Bilateral maxillary, ethmoid and possibly frontal sinusitis > 21 days augmentin >    f/u ct sinus ordered 01/13/11> Improvement since the prior exam. - Allergy screen sent 01/12/2011  >  Eos 0.1   IgE  211 neg RAST - DgEs 02/26/15 c/w GERD  - repeat sinus CT 01/20/2018 >>>Polyp or retention cyst filling most of the right maxillary sinus, stable from 2012. There is superimposed bilateral maxillary sinusitis.> refer to ent > saw shoemaker 02/07/18 rec astelin > improved pnds as of 03/28/2018   Adequate control on present rx, reviewed in detail with pt > no change in rx needed

## 2018-03-28 NOTE — Patient Instructions (Signed)
Work on inhaler technique:  relax and gently blow all the way out then take a nice smooth deep breath back in, triggering the inhaler at same time you start breathing in.  Hold for up to 5 seconds if you can. Blow out thru nose. Rinse and gargle with water when done     Please schedule a follow up visit in 6  months but call sooner if needed  

## 2018-03-28 NOTE — Progress Notes (Signed)
Subjective:    Patient ID: Carlos Romero, male    DOB: 03-13-1946   MRN: [id number]    Brief patient profile:  4 yowm quit smoking 1988 with GOLD II copd  Documented 2010     History of Present Illness  04/15/2015  "new pt"  ov/ re: recurrent cough last seen 2012 with gerd/ sinus dz  Chief Complaint  Patient presents with  . Advice Only    last seen 12/2010-pt c/o sob, wheezing, prod cough with clear mucus X1 week.  pt takes tramadol to suppress cough.    Was doing fine on prn saba maybe once a month but worse sob/cough x one week and last  took one treatment noct before ov using 72 year old inhaler Onset was abrupt/ pattern is persistent cough/sob but  Does not keep him up at hs / no am flare / needs tramadol one daily to control cough  typically in pm after am ppi Assoc min nasal congestion/ no purulent sputum  >>PPI /Dulera As needed  Cough   01/06/2016 NP Acute OV   : Cough off dulera   Pt present for acute office visit. Complains that cough has returned 4 months ago. Worse for last couple of weeks. Complains of  a dry cough, chest congestion, sinus pressure/drainage, SOB and wheezing. Feels like he has congestion but cant get up anything.  Not using Dulera, it is too expensive. Was given Symbicort which he is says helps but ran out of sample.  Last PFT in 2010 showed FEV1 at 65%. Ratio 62.  rec Augmentin 875mg  Twice daily  For 7 days -take with food.  Prednisone taper over next week.  Mucinex DM Twice daily  As needed  Cough/congestioni  Start Symbicort 2 puffs Twice daily  , rinse after use> 85% better     01/28/2016  f/u ov/ re: GOLD II copd/ AB/ recurrent cough on symb 160 [name] /otc ppi Chief Complaint  Patient presents with  . Follow-up    Pt called in on 01/24/15 with co's of cough x 1 day- pred taper called in and cough has now improved. He is coughing up clear sputum. He has noticed some wheezing.   still some coughing p meals then much worse since 01/23/16  assoc with sneezing  Not using any saba since back on symbicort  rec Plan A = Automatic = Symbicort 160 2 puffs first thing in am and a second 2 pffs in evening if needed Work on inhaler technique:  Plan B = Backup Only use your albuterol as a rescue medication Start generic protonix 40 mg Take 30-60 min before first meal of the day and add prilosec otc 20 mg before supper if coughing at all GERD diet    11/26/17 Tammy NP rec  Augmentin 875mg  Twice daily  For 7 days -take with food.  Prednisone taper over next week.  Mucinex DM Twice daily  As needed  Cough/congestioni  Re-Start Symbicort 2 puffs Twice daily  , rinse after use.   01/06/2018  Acute ext ov/ re: cough and sob not much better since last ov / worse x 2 days Chief Complaint  Patient presents with  . Acute Visit    Pt c/o increased SOB and cough with clear sputum x 2 days. His cough is prod with clear sputum. He is not sleeping well due to cough and wheezing.   not doing as  Well since late march 2019  attributing to "allergy season"  With  cough/ sense of post drainage worst at hs  Assoc with sense of wheezing rec For drainage / throat tickle try take CHLORPHENIRAMINE  4 mg - take one every 4 hours as needed -   Please see patient coordinator before you leave today  to schedule sinus CT  Prednisone 10 mg take  4 each am x 2 days,   2 each am x 2 days,  1 each am x 2 days and stop Take delsym two tsp every 12 hours and supplement if needed with  Tylenol #3 up to 1-2 every 4 hours to suppress the urge to cough.     03/28/2018  f/u ov/ re: chronic cough/ maint on protonix plus symb 160 2bid  Chief Complaint  Patient presents with  . Follow-up    Breathing has improved since last visit and he is sleeping better. He has not had to use his albuterol inhaler.    Dyspnea:  MMRC1 = can walk nl pace, flat grade, can't hurry or go uphills or steps s sob   Cough: laughing Sleeping: fetal/ one pillow  SABA use: none 02:  none         No   obvious day to day or daytime variability or assoc excess/ purulent sputum or mucus plugs or hemoptysis or cp or chest tightness, subjective wheeze or overt sinus or hb symptoms.    Sleeps flat without nocturnal  or early am exacerbation  of respiratory  c/o's or need for noct saba. Also denies any obvious fluctuation of symptoms with weather or environmental changes or other aggravating or alleviating factors except as outlined above   No unusual exposure hx or h/o childhood pna/ asthma or knowledge of premature birth.  Current Allergies, Complete Past Medical History, Past Surgical History, Family History, and Social History were reviewed in [employer] record.  ROS  The following are not active complaints unless bolded Hoarseness, sore throat, dysphagia, dental problems, itching, sneezing,  nasal congestion or discharge of excess watery  mucus better on astelin per [facility] or purulent secretions, ear ache,   fever, chills, sweats, unintended wt loss or wt gain, classically pleuritic or exertional cp,  orthopnea pnd or arm/hand swelling  or leg swelling, presyncope, palpitations, abdominal pain, anorexia, nausea, vomiting, diarrhea  or change in bowel habits or change in bladder habits, change in stools or change in urine, dysuria, hematuria,  rash, arthralgias, visual complaints, headache, numbness, weakness or ataxia or problems with walking or coordination,  change in mood or  memory.        Current Meds  Medication Sig  . albuterol (VENTOLIN HFA) 108 (90 Base) MCG/ACT inhaler Inhale 2 puffs into the lungs every 6 (six) hours as needed for wheezing or shortness of breath.  Marland Kitchen azelastine (ASTELIN) 0.1 % nasal spray 1 spray 2 (two) times daily.  Marland Kitchen b complex vitamins tablet Take 1 tablet by mouth daily.  . budesonide-formoterol (SYMBICORT) 160-4.5 MCG/ACT inhaler INHALE 2 PUFFS INTO LUNGS TWICE DAILY  . chlorpheniramine (CHLOR-TRIMETON) 4 MG tablet  Take 4 mg by mouth every 4 (four) hours as needed for allergies.  . diphenhydrAMINE (BENADRYL) 25 MG tablet Take 25 mg by mouth at bedtime as needed for allergies.  Marland Kitchen loperamide (IMODIUM A-D) 2 MG tablet Take 2 mg by mouth 4 (four) times daily as needed for diarrhea or loose stools.  Marland Kitchen loratadine (CLARITIN) 10 MG tablet Take 10 mg by mouth daily.  Marland Kitchen omeprazole (PRILOSEC) 20 MG capsule Take 20 mg  by mouth daily as needed.   . pantoprazole (PROTONIX) 40 MG tablet TAKE ONE TABLET BY MOUTH ONCE DAILY 30 TO 60 MINUTES BEFORE FIRST MEAL OF THE DAY  . valACYclovir (VALTREX) 1000 MG tablet as directed.   . [DISCONTINUED] predniSONE (DELTASONE) 10 MG tablet Take  4 each am x 2 days,   2 each am x 2 days,  1 each am x 2 days and stop  . [DISCONTINUED] predniSONE (DELTASONE) 10 MG tablet 4 tabs x 2 days, 2 tabs x 2 days, 1 tab x 2 days then stop               Objective:   Physical Exam  amb wm nad   03/28/2018         262  01/06/2018       261  03/12/2016       250   01/28/2016        265   04/15/15 246 lb (111.585 kg)  12/07/14 240 lb (108.863 kg)  03/27/13 235 lb (106.595 kg)    Vital signs reviewed - Note on arrival 02 sats  96% on RA         HEENT: nl  turbinates bilaterally, and oropharynx. Nl external ear canals without cough reflex - full dentures    NECK :  without JVD/Nodes/TM/ nl carotid upstrokes bilaterally   LUNGS: no acc muscle use,  Nl contour chest which is clear to A and P bilaterally without cough on insp or exp maneuvers   CV:  RRR  no s3 or murmur or increase in P2, and no edema   ABD:  Obese soft and nontender with nl inspiratory excursion in the supine position. No bruits or organomegaly appreciated, bowel sounds nl  MS:  Nl gait/ ext warm without deformities, calf tenderness, cyanosis or clubbing No obvious joint restrictions   SKIN: warm and dry without lesions    NEURO:  alert, approp, nl sensorium with  no motor or cerebellar deficits apparent.                   Assessment & Plan:

## 2018-03-30 ENCOUNTER — Ambulatory Visit (INDEPENDENT_AMBULATORY_CARE_PROVIDER_SITE_OTHER): Payer: Medicare Other | Admitting: Orthopedic Surgery

## 2018-04-07 ENCOUNTER — Ambulatory Visit (INDEPENDENT_AMBULATORY_CARE_PROVIDER_SITE_OTHER): Payer: Medicare Other | Admitting: Orthopedic Surgery

## 2018-04-07 ENCOUNTER — Encounter (INDEPENDENT_AMBULATORY_CARE_PROVIDER_SITE_OTHER): Payer: Self-pay | Admitting: Orthopedic Surgery

## 2018-04-07 DIAGNOSIS — M7542 Impingement syndrome of left shoulder: Secondary | ICD-10-CM

## 2018-04-07 NOTE — Progress Notes (Signed)
Office Visit Note   Patient: Carlos Romero           Date of Birth: 02-06-1946           MRN: [id number] Visit Date: 04/07/2018              Requested by: Gaynelle Arabian, MD [address] [place], [place] [address] PCP: Gaynelle Arabian, MD  Chief Complaint  Patient presents with  . Left Shoulder - Follow-up, Pain      HPI: Patient is a 72 year old gentleman who presents in follow-up for impingement symptoms left shoulder.  Patient states that the subacromial injection did not provide him any relief his symptoms most likely are coming from labral pathology.  Patient states previous injection of the right shoulder completely relieved his symptoms.  Assessment & Plan: Visit Diagnoses:  1. Impingement syndrome of left shoulder     Plan: Discussed that we could proceed with additional injections or could proceed with arthroscopic intervention risk and benefits were discussed including the potential for about 75% relief in his symptoms.  Patient states he would like to think about this and will call if he wants to schedule surgery.  Follow-Up Instructions: Return if symptoms worsen or fail to improve.   Ortho Exam  Patient is alert, oriented, no adenopathy, well-dressed, normal affect, normal respiratory effort. Examination patient has full range of motion of the left shoulder Neer impingement test is negative Hawkins impingement test is positive he is tender to palpation anteriorly over the joint over the biceps tendon and over the [facility] joint.  Imaging: No results found. No images are attached to the encounter.  Labs: No results found for: HGBA1C, ESRSEDRATE, CRP, LABURIC, REPTSTATUS, GRAMSTAIN, CULT, LABORGA   Lab Results  Component Value Date   ALBUMIN 3.2 (L) 11/15/2010   ALBUMIN 3.7 02/22/2009    There is no height or weight on file to calculate BMI.  Orders:  No orders of the defined types were placed in this encounter.  No orders of the defined  types were placed in this encounter.    Procedures: No procedures performed  Clinical Data: No additional findings.  ROS:  All other systems negative, except as noted in the HPI. Review of Systems  Objective: Vital Signs: There were no vitals taken for this visit.  Specialty Comments:  No specialty comments available.  PMFS History: Patient Active Problem List   Diagnosis Date Noted  . Pneumonia 12/23/2017  . Obesity (BMI 30-39.9) 04/16/2015  . Pain in lower limb 12/05/2013  . Onychomycosis 08/29/2013  . Pain in toe 08/29/2013  . Plantar fasciitis of left foot 03/27/2013  . Equinus deformity of foot, acquired 03/27/2013  . Acute sinusitis 11/07/2010  . PULMONARY NODULE 01/08/2010  . PROSTATE CANCER 12/12/2009  . HYPERCHOLESTEROLEMIA 12/12/2009  . DEPRESSION 12/12/2009  . PULMONARY EMBOLISM 12/12/2009  . COPD GOLD II  12/12/2009  . GERD 12/12/2009  . BARRETTS ESOPHAGUS 12/12/2009  . Cough 12/12/2009  . PUD, HX OF 12/12/2009   Past Medical History:  Diagnosis Date  . Barrett's esophagus   . Chronic cough    onset Oct 2010, better after prednisone short course 12-2009, sinus CT 11-06-10 bilateral maxillary, ethmoid and possibly frontal sinusitis>21 days augmentin  . COPD (chronic obstructive pulmonary disease) (HCC)    gold stage 2, PFTs 06-12-09 FEV1 2.25 (65%) ratio 62 and no better with B2  . Depression 1999  . GERD (gastroesophageal reflux disease)   . Hyperlipidemia   .  Lung disease    LLL air space disease  . Prostate cancer ([place]) 2003  . PUD (peptic ulcer disease) 1967   s/p perf  . Pulmonary embolism ([place]) 1967  . Solitary lung nodule    RML    Family History  Problem Relation Age of Onset  . Cancer Mother        bladder  . Stroke Mother   . Emphysema Other        "everyone"  . Asthma Mother     Past Surgical History:  Procedure Laterality Date  . PROSTATECTOMY  1999   Social History   Occupational History  . Occupation: Programmer, systems: Retired  Tobacco Use  . Smoking status: Former Smoker    Packs/day: 1.00    Years: 20.00    Pack years: 20.00    Types: Cigarettes    Last attempt to quit: 08/24/1986    Years since quitting: 31.6  . Smokeless tobacco: Never Used  Substance and Sexual Activity  . Alcohol use: No    Alcohol/week: 0.0 standard drinks    Comment: none since 2010  . Drug use: Not on file  . Sexual activity: Not on file

## 2018-04-18 ENCOUNTER — Telehealth (INDEPENDENT_AMBULATORY_CARE_PROVIDER_SITE_OTHER): Payer: Self-pay | Admitting: Radiology

## 2018-04-18 NOTE — Telephone Encounter (Signed)
Patient left voicemail requesting CD copy of x-rays. Patient is aware CD is made and will be placed at the front desk for pick up.

## 2018-04-27 ENCOUNTER — Ambulatory Visit (INDEPENDENT_AMBULATORY_CARE_PROVIDER_SITE_OTHER)
Admission: RE | Admit: 2018-04-27 | Discharge: 2018-04-27 | Disposition: A | Discharge status: Home / Self Care | Payer: Medicare Other | Source: Ambulatory Visit | Attending: Nurse Practitioner | Admitting: Nurse Practitioner

## 2018-04-27 ENCOUNTER — Encounter: Payer: Self-pay | Admitting: Nurse Practitioner

## 2018-04-27 ENCOUNTER — Ambulatory Visit: Payer: Medicare Other | Admitting: Nurse Practitioner

## 2018-04-27 VITALS — BP 140/78 | HR 93 | Ht 74.0 in | Wt 261.4 lb

## 2018-04-27 DIAGNOSIS — R05 Cough: Secondary | ICD-10-CM

## 2018-04-27 DIAGNOSIS — R059 Cough, unspecified: Secondary | ICD-10-CM

## 2018-04-27 DIAGNOSIS — J449 Chronic obstructive pulmonary disease, unspecified: Secondary | ICD-10-CM | POA: Diagnosis not present

## 2018-04-27 MED ORDER — PREDNISONE 10 MG PO TABS: ORAL_TABLET | ORAL | 0 refills | Status: DC

## 2018-04-27 MED ORDER — AMOXICILLIN-POT CLAVULANATE 875-125 MG PO TABS
1.0000 | ORAL_TABLET | Freq: Two times a day (BID) | ORAL | 0 refills | Status: DC
Start: 2018-04-27 — End: 2018-07-07

## 2018-04-27 MED ORDER — PROMETHAZINE-DM 6.25-15 MG/5ML PO SYRP: 5.0000 mL | ORAL_SOLUTION | Freq: Four times a day (QID) | ORAL | 0 refills | Status: DC | PRN

## 2018-04-27 NOTE — Progress Notes (Signed)
@Patient  ID: Carlos Romero, male    DOB: June 15, 1946, 72 y.o.   MRN: [id number]  Chief Complaint  Patient presents with  . Cough    Productive cough since Saturday    Referring provider: Gaynelle Arabian, MD  HPI  72 year old male former smoker with GOLD stage 2 COPD and asthmatic bronchitis who is followed by Dr. Melvyn Novas  TEST   PFT's 06/12/09 FEV1 2.25 (65%) ratio 62 and no better with B2  PFT's  03/12/2016  FEV1 2.14 (58 % ) ratio 60  p 2 % improvement from saba p nothing prior to study with DLCO  64/62 % corrects to 79  % for alv volume    OV 04/27/18 - Acute - COPD/cough Patient presents for an acute visit today. He complains of cough x 1 week. He states that he has congestion, wheezing. Denies fever or sinus drainage. Denies chest pain, orthopnea, edema, or fever. Was seen by urgent care this past weekend and was prescribed prednisone 20 mg daily x 5 days. States that the prednisone has not helped.   Allergies  Allergen Reactions  . Fluoxetine Other (See Comments)    Vivid dreams  . Morphine     REACTION: "feels crazy"  . Morphine Sulfate Anxiety  . Prozac [Fluoxetine Hcl] Anxiety    Immunization History  Administered Date(s) Administered  . Influenza Split 06/14/2014  . Influenza Whole 05/24/2009, 10/27/2010  . Influenza, High Dose Seasonal PF 07/17/2014, 07/22/2016, 05/28/2017, 06/24/2017  . Influenza,inj,Quad PF,6+ Mos 06/06/2015  . Pneumococcal Polysaccharide-23 04/14/2014    Past Medical History:  Diagnosis Date  . Barrett's esophagus   . Chronic cough    onset Oct 2010, better after prednisone short course 12-2009, sinus CT 11-06-10 bilateral maxillary, ethmoid and possibly frontal sinusitis>21 days augmentin  . COPD (chronic obstructive pulmonary disease) (HCC)    gold stage 2, PFTs 06-12-09 FEV1 2.25 (65%) ratio 62 and no better with B2  . Depression 1999  . GERD (gastroesophageal reflux disease)   . Hyperlipidemia   . Lung disease    LLL air space  disease  . Prostate cancer ([place]) 2003  . PUD (peptic ulcer disease) 1967   s/p perf  . Pulmonary embolism ([place]) 1967  . Solitary lung nodule    RML    Tobacco History: Social History   Tobacco Use  Smoking Status Former Smoker  . Packs/day: 1.00  . Years: 20.00  . Pack years: 20.00  . Types: Cigarettes  . Last attempt to quit: 08/24/1986  . Years since quitting: 31.6  Smokeless Tobacco Never Used   Counseling given: Yes   Outpatient Encounter Medications as of 04/27/2018  Medication Sig  . albuterol (VENTOLIN HFA) 108 (90 Base) MCG/ACT inhaler Inhale 2 puffs into the lungs every 6 (six) hours as needed for wheezing or shortness of breath.  Marland Kitchen azelastine (ASTELIN) 0.1 % nasal spray 1 spray 2 (two) times daily.  Marland Kitchen b complex vitamins tablet Take 1 tablet by mouth daily.  . budesonide-formoterol (SYMBICORT) 160-4.5 MCG/ACT inhaler INHALE 2 PUFFS INTO LUNGS TWICE DAILY  . chlorpheniramine (CHLOR-TRIMETON) 4 MG tablet Take 4 mg by mouth every 4 (four) hours as needed for allergies.  . diphenhydrAMINE (BENADRYL) 25 MG tablet Take 25 mg by mouth at bedtime as needed for allergies.  Marland Kitchen ipratropium (ATROVENT) 0.06 % nasal spray Place 2 sprays into both nostrils 4 (four) times daily.  Marland Kitchen loperamide (IMODIUM A-D) 2 MG tablet Take 2 mg by mouth 4 (four) times  daily as needed for diarrhea or loose stools.  Marland Kitchen loratadine (CLARITIN) 10 MG tablet Take 10 mg by mouth daily.  Marland Kitchen omeprazole (PRILOSEC) 20 MG capsule Take 20 mg by mouth daily as needed.   . pantoprazole (PROTONIX) 40 MG tablet TAKE ONE TABLET BY MOUTH ONCE DAILY 30 TO 60 MINUTES BEFORE FIRST MEAL OF THE DAY  . predniSONE (DELTASONE) 20 MG tablet Take 20 mg by mouth 2 (two) times daily.  . valACYclovir (VALTREX) 1000 MG tablet as directed.   Marland Kitchen amoxicillin-clavulanate (AUGMENTIN) 875-125 MG tablet Take 1 tablet by mouth 2 (two) times daily.  . budesonide-formoterol (SYMBICORT) 160-4.5 MCG/ACT inhaler Inhale 2 puffs into the lungs 2 (two)  times daily. (Patient not taking: Reported on 04/27/2018)  . predniSONE (DELTASONE) 10 MG tablet Take 4 tabs for 2 days, then 3 tabs for 2 days, then 2 tabs for 2 days, then 1 tab for 2 days, then stop  . promethazine-dextromethorphan (PROMETHAZINE-DM) 6.25-15 MG/5ML syrup Take 5 mLs by mouth 4 (four) times daily as needed for cough.   No facility-administered encounter medications on file as of 04/27/2018.      Review of Systems  Review of Systems  Constitutional: Negative.  Negative for chills and fever.  HENT: Negative.  Negative for congestion and postnasal drip.   Respiratory: Positive for cough and shortness of breath.   Cardiovascular: Negative.   Gastrointestinal: Negative.   Allergic/Immunologic: Negative.   Neurological: Negative.   Psychiatric/Behavioral: Negative.        Physical Exam  BP 140/78 (BP Location: Left Arm, Patient Position: Sitting, Cuff Size: Normal)   Pulse 93   Ht 6\' 2"  (1.88 m)   Wt 261 lb 6.4 oz (118.6 kg)   SpO2 96%   BMI 33.56 kg/m   Wt Readings from Last 5 Encounters:  04/27/18 261 lb 6.4 oz (118.6 kg)  03/28/18 262 lb (118.8 kg)  03/01/18 261 lb (118.4 kg)  01/06/18 261 lb 3.2 oz (118.5 kg)  12/23/17 263 lb 12.8 oz (119.7 kg)     Physical Exam  Constitutional: He is oriented to person, place, and time. He appears well-developed and well-nourished. No distress.  Cardiovascular: Normal rate and regular rhythm.  Pulmonary/Chest: Effort normal and breath sounds normal. He has no wheezes. He has no rales.  Congested cough noted.  Neurological: He is alert and oriented to person, place, and time.  Skin: Skin is warm and dry.  Psychiatric: He has a normal mood and affect.  Nursing note and vitals reviewed.     Assessment & Plan:   COPD GOLD II  Patient Instructions  Will order chest x ray and call with results Augmentin twice daily for 7 days Prednisone taper May take mucinex Continue all other medications as directed Follow up in  3 weeks Call if symptoms worsen or go to the ED        Fenton Foy, NP 04/27/2018

## 2018-04-27 NOTE — Assessment & Plan Note (Signed)
Patient Instructions  Will order chest x ray and call with results Augmentin twice daily for 7 days Prednisone taper May take mucinex Continue all other medications as directed Follow up in 3 weeks Call if symptoms worsen or go to the ED

## 2018-04-27 NOTE — Patient Instructions (Signed)
Will order chest x ray and call with results Augmentin twice daily for 7 days Prednisone taper May take mucinex Continue all other medications as directed Follow up in 3 weeks Call if symptoms worsen or go to the ED

## 2018-05-02 NOTE — Progress Notes (Signed)
Chart and office note reviewed in detail  > agree with a/p as outlined    

## 2018-06-02 ENCOUNTER — Encounter: Payer: Self-pay | Admitting: Internal Medicine

## 2018-06-02 ENCOUNTER — Ambulatory Visit: Payer: Medicare Other | Admitting: Internal Medicine

## 2018-06-02 VITALS — BP 132/74 | HR 79 | Resp 16 | Ht 74.0 in | Wt 262.0 lb

## 2018-06-02 DIAGNOSIS — J449 Chronic obstructive pulmonary disease, unspecified: Secondary | ICD-10-CM

## 2018-06-02 DIAGNOSIS — R0602 Shortness of breath: Secondary | ICD-10-CM | POA: Diagnosis not present

## 2018-06-02 DIAGNOSIS — Z23 Encounter for immunization: Secondary | ICD-10-CM | POA: Diagnosis not present

## 2018-06-02 DIAGNOSIS — J320 Chronic maxillary sinusitis: Secondary | ICD-10-CM

## 2018-06-02 DIAGNOSIS — J301 Allergic rhinitis due to pollen: Secondary | ICD-10-CM

## 2018-06-02 DIAGNOSIS — R911 Solitary pulmonary nodule: Secondary | ICD-10-CM

## 2018-06-02 DIAGNOSIS — R05 Cough: Secondary | ICD-10-CM | POA: Diagnosis not present

## 2018-06-02 DIAGNOSIS — R059 Cough, unspecified: Secondary | ICD-10-CM

## 2018-06-02 NOTE — Progress Notes (Signed)
[facility] [place], [place] [address]  Pulmonary Sleep Medicine   Office Visit Note  Patient Name: Carlos Romero DOB: 10-Oct-1945 MRN [id number]  Date of Service: 06/02/2018  Complaints/HPI: has been diagnosed with COPD many years ago. Has been seen in [place] for management. Patient is on mucinex chlortabs and also is on symbicort. He states he also uses albuterol PRN. He was a smoker in the past but states he has not smoked for over 31 years. He states that he has also been having a lot of wheeze and SOB. States his cough has not been going away. He states that he is bringing up a clear sputum sometimes cluody. He notes some drainage from back of his throat dripping down into his lungs. Patient states this is the first year it has not gone away. He does have a hisotry of multiple allergies also including pollen dust and cats in the past. He was on shots for this more than 20 years ago. Sinus CT done in May shows retention cyst and maxillary sinusitis. Last CXR was done September shows some increased interstitial markings. 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 Eyes: (-) visual changes, (-) redness, (-) itching. Nose and Sinuses: (-) nasal stuffiness or itchiness, (-) postnasal drip, (-) nosebleeds, (-) sinus trouble. Mouth and Throat: (-) sore throat, (-) hoarseness. Neck: (-) swollen glands, (-) enlarged thyroid, (-) neck pain. Respiratory: + cough, (-) bloody sputum, + shortness of breath, + wheezing. Cardiovascular: - ankle swelling, (-) chest pain. Lymphatic: (-) lymph node enlargement. Neurologic: (-) numbness, (-) tingling. Psychiatric: (-) anxiety, (-) depression   Current Medication: Outpatient Encounter Medications as of 06/02/2018  Medication Sig Note  . azelastine (ASTELIN) 0.1 % nasal spray 1 spray 2  (two) times daily.   . budesonide-formoterol (SYMBICORT) 160-4.5 MCG/ACT inhaler INHALE 2 PUFFS INTO LUNGS TWICE DAILY   . budesonide-formoterol (SYMBICORT) 160-4.5 MCG/ACT inhaler Inhale 2 puffs into the lungs 2 (two) times daily.   . chlorpheniramine (CHLOR-TRIMETON) 4 MG tablet Take 4 mg by mouth every 4 (four) hours as needed for allergies.   . diphenhydrAMINE (BENADRYL) 25 MG tablet Take 25 mg by mouth at bedtime as needed for allergies.   . Multiple Vitamins-Minerals (VITAMINS TO GO MEN PO) Take by mouth.   . pantoprazole (PROTONIX) 40 MG tablet TAKE ONE TABLET BY MOUTH ONCE DAILY 30 TO 60 MINUTES BEFORE FIRST MEAL OF THE DAY   . valACYclovir (VALTREX) 1000 MG tablet as directed.  12/05/2013: Received from: External Pharmacy  . albuterol (VENTOLIN HFA) 108 (90 Base) MCG/ACT inhaler Inhale 2 puffs into the lungs every 6 (six) hours as needed for wheezing or shortness of breath.   Marland Kitchen amoxicillin-clavulanate (AUGMENTIN) 875-125 MG tablet Take 1 tablet by mouth 2 (two) times daily. (Patient not taking: Reported on 06/02/2018)   . b complex vitamins tablet Take 1 tablet by mouth daily.   Marland Kitchen ipratropium (ATROVENT) 0.06 % nasal spray Place 2 sprays into both nostrils 4 (four) times daily.   Marland Kitchen loperamide (IMODIUM A-D) 2 MG tablet Take 2 mg by mouth 4 (four) times daily as needed for diarrhea or loose stools.   Marland Kitchen loratadine (CLARITIN) 10 MG tablet Take 10 mg by mouth daily.   Marland Kitchen omeprazole (PRILOSEC) 20 MG capsule Take 20 mg by mouth daily as needed.    Marland Kitchen  predniSONE (DELTASONE) 10 MG tablet Take 4 tabs for 2 days, then 3 tabs for 2 days, then 2 tabs for 2 days, then 1 tab for 2 days, then stop (Patient not taking: Reported on 06/02/2018)   . predniSONE (DELTASONE) 20 MG tablet Take 20 mg by mouth 2 (two) times daily.   . promethazine-dextromethorphan (PROMETHAZINE-DM) 6.25-15 MG/5ML syrup Take 5 mLs by mouth 4 (four) times daily as needed for cough. (Patient not taking: Reported on 06/02/2018)    No  facility-administered encounter medications on file as of 06/02/2018.     Surgical History: Past Surgical History:  Procedure Laterality Date  . PROSTATECTOMY  1999    Medical History: Past Medical History:  Diagnosis Date  . Barrett's esophagus   . Chronic cough    onset Oct 2010, better after prednisone short course 12-2009, sinus CT 11-06-10 bilateral maxillary, ethmoid and possibly frontal sinusitis>21 days augmentin  . COPD (chronic obstructive pulmonary disease) (HCC)    gold stage 2, PFTs 06-12-09 FEV1 2.25 (65%) ratio 62 and no better with B2  . Depression 1999  . GERD (gastroesophageal reflux disease)   . Hyperlipidemia   . Lung disease    LLL air space disease  . Prostate cancer ([place]) 2003  . PUD (peptic ulcer disease) 1967   s/p perf  . Pulmonary embolism ([place]) 1967  . Solitary lung nodule    RML    Family History: Family History  Problem Relation Age of Onset  . Cancer Mother        bladder  . Stroke Mother   . Asthma Mother   . Emphysema Other        "everyone"    Social History: Social History   Socioeconomic History  . Marital status: Married    Spouse name: Not on file  . Number of children: Not on file  . Years of education: Not on file  . Highest education level: Not on file  Occupational History  . Occupation: Retail buyer: Retired  Scientific laboratory technician  . Financial resource strain: Not on file  . Food insecurity:    Worry: Not on file    Inability: Not on file  . Transportation needs:    Medical: Not on file    Non-medical: Not on file  Tobacco Use  . Smoking status: Former Smoker    Packs/day: 1.00    Years: 20.00    Pack years: 20.00    Types: Cigarettes    Last attempt to quit: 08/24/1986    Years since quitting: 31.7  . Smokeless tobacco: Never Used  Substance and Sexual Activity  . Alcohol use: No    Alcohol/week: 0.0 standard drinks    Comment: none since 2010  . Drug use: Not on file  . Sexual activity: Not on file   Lifestyle  . Physical activity:    Days per week: Not on file    Minutes per session: Not on file  . Stress: Not on file  Relationships  . Social connections:    Talks on phone: Not on file    Gets together: Not on file    Attends religious service: Not on file    Active member of club or organization: Not on file    Attends meetings of clubs or organizations: Not on file    Relationship status: Not on file  . Intimate partner violence:    Fear of current or ex partner: Not on file    Emotionally abused:  Not on file    Physically abused: Not on file    Forced sexual activity: Not on file  Other Topics Concern  . Not on file  Social History Narrative  . Not on file    Vital Signs: Blood pressure 132/74, pulse 79, resp. rate 16, height 6\' 2"  (1.88 m), weight 262 lb (118.8 kg), SpO2 96 %.  Examination: General Appearance: The patient is well-developed, well-nourished, and in no distress. Skin: Gross inspection of skin unremarkable. Head: normocephalic, no gross deformities. Eyes: no gross deformities noted. ENT: ears appear grossly normal no exudates. Neck: Supple. No thyromegaly. No LAD. Respiratory: Few scattered rhonchi are noted at this time.. Cardiovascular: Normal S1 and S2 without murmur or rub. Extremities: No cyanosis. pulses are equal. Neurologic: Alert and oriented. No involuntary movements.  LABS: No results found for this or any previous visit (from the past 2160 hour(s)).  Radiology: Dg Chest 2 View  Result Date: 04/27/2018 CLINICAL DATA:  Cough and shortness of breath EXAM: CHEST - 2 VIEW COMPARISON:  12/23/2017 FINDINGS: The lungs are hyperinflated with diffuse interstitial prominence. No focal airspace consolidation or pulmonary edema. No pleural effusion or pneumothorax. Normal cardiomediastinal contours. IMPRESSION: COPD without acute airspace disease. Electronically Signed   By: Ulyses Jarred M.D.   On: 04/27/2018 14:50    No results found.  No  results found.    Assessment and Plan: Patient Active Problem List   Diagnosis Date Noted  . Pneumonia 12/23/2017  . Obesity (BMI 30-39.9) 04/16/2015  . Pain in lower limb 12/05/2013  . Onychomycosis 08/29/2013  . Pain in toe 08/29/2013  . Plantar fasciitis of left foot 03/27/2013  . Equinus deformity of foot, acquired 03/27/2013  . Acute sinusitis 11/07/2010  . PULMONARY NODULE 01/08/2010  . PROSTATE CANCER 12/12/2009  . HYPERCHOLESTEROLEMIA 12/12/2009  . DEPRESSION 12/12/2009  . PULMONARY EMBOLISM 12/12/2009  . COPD GOLD II  12/12/2009  . GERD 12/12/2009  . BARRETTS ESOPHAGUS 12/12/2009  . Cough 12/12/2009  . PUD, HX OF 12/12/2009    1. Cough/SOB he has had long-standing diagnosis of sinusitis and COPD.  I reviewed his pulmonary function and they are mild at best.  He has not had any spirometry or PFT studies done for about 2 years.  In addition I reviewed his radiological studies he has had pulmonary embolism in the past he is also had pulmonary nodules in the past and we need to to follow-up for both of these diagnoses. 2. COPD pulmonary function studies were ordered and also I gave him a sample of Trelegy to use he will not use his Symbicort states the Symbicort is not helping him at all. 3. Pulmonary nodule by history as above we will get a CT scan of his chest done.  The last chest x-ray which was done in September had shown some increased interstitial markings and so therefore I would be concerned about possibility of interstitial disease although he does not have any significant occupational exposure. 4. Allergic Rhinitis he does have a strong allergy history when he was younger states he was on shots for some time.  In addition with the presence of his rhinitis symptoms and the maxillary retention cyst I would recommend getting a follow-up on allergy studies and if he has significant allergies then he would benefit from allergy shots 5. Maxillary sinusitis patient has  retention cyst he is considering a second opinion regarding possibility of sinus surgery.  He does have chronic sinusitis in the same side.  We  will continue with the nasal inhalers at this time.  General Counseling: I have discussed the findings of the evaluation and examination with Gwyndolyn Saxon.  I have also discussed any further diagnostic evaluation thatmay be needed or ordered today. Akeem verbalizes understanding of the findings of todays visit. We also reviewed his medications today and discussed drug interactions and side effects including but not limited excessive drowsiness and altered mental states. We also discussed that there is always a risk not just to him but also people around him. he has been encouraged to call the office with any questions or concerns that should arise related to todays visit.    Time spent: 54min  I have personally obtained a history, examined the patient, evaluated laboratory and imaging results, formulated the assessment and plan and placed orders.    Allyne Gee, MD [facility] Pulmonary and Critical Care Sleep medicine

## 2018-06-02 NOTE — Patient Instructions (Signed)

## 2018-06-08 ENCOUNTER — Ambulatory Visit: Payer: Medicare Other | Admitting: Internal Medicine

## 2018-06-08 DIAGNOSIS — R0602 Shortness of breath: Secondary | ICD-10-CM | POA: Diagnosis not present

## 2018-06-08 LAB — PULMONARY FUNCTION TEST

## 2018-06-09 NOTE — Procedures (Signed)
[place], [address]  DATE OF SERVICE: June 08, 2018  Complete Pulmonary Function Testing Interpretation:  FINDINGS:  The forced vital capacity is mildly decreased.  The FEV1 is 2.28 L which is 59% of predicted and is moderately decreased.  Postbronchodilator there does not appear to be any significant change in the FEV1 by spirometric criteria.  FEV1 FVC ratio is moderately decreased.  Total lung capacity is within normal range.  Residual volume is normal residual volume total lung capacity ratio is increased the FRC is normal.  The DLCO was moderately decreased and normal when corrected for alveolar volume.  IMPRESSION:  This pulmonary function study is consistent with moderate obstructive lung disease.  There does not appear to be improvement after bronchodilators clinical correlation is recommended.  Allyne Gee, MD [facility] Pulmonary Critical Care Medicine Sleep Medicine

## 2018-07-04 DIAGNOSIS — M7542 Impingement syndrome of left shoulder: Secondary | ICD-10-CM | POA: Insufficient documentation

## 2018-07-05 ENCOUNTER — Other Ambulatory Visit: Payer: Self-pay | Admitting: Internal Medicine

## 2018-07-05 ENCOUNTER — Ambulatory Visit
Admission: RE | Admit: 2018-07-05 | Discharge: 2018-07-05 | Disposition: A | Discharge status: Home / Self Care | Payer: Medicare Other | Source: Ambulatory Visit | Attending: Internal Medicine | Admitting: Internal Medicine

## 2018-07-05 ENCOUNTER — Encounter: Payer: Self-pay | Admitting: Internal Medicine

## 2018-07-05 ENCOUNTER — Ambulatory Visit: Payer: Medicare Other | Admitting: Internal Medicine

## 2018-07-05 VITALS — BP 146/92 | HR 78 | Resp 16 | Ht 74.0 in | Wt 261.0 lb

## 2018-07-05 DIAGNOSIS — J301 Allergic rhinitis due to pollen: Secondary | ICD-10-CM | POA: Diagnosis not present

## 2018-07-05 DIAGNOSIS — R911 Solitary pulmonary nodule: Secondary | ICD-10-CM | POA: Insufficient documentation

## 2018-07-05 MED ORDER — FLUTICASONE-UMECLIDIN-VILANT 100-62.5-25 MCG/INH IN AEPB: 1.0000 | INHALATION_SPRAY | Freq: Once | RESPIRATORY_TRACT | 2 refills | Status: AC

## 2018-07-05 NOTE — Progress Notes (Signed)
    [facility] Allergy MQT Recording Form  [facility] [address] [place], [place] [address] Phone [phone] Fax [phone]   Patient Name: Carlos Romero Age: 72 y.o. Sex: male Date of Service: @SERVICEDATE @   Performing Provider: Allyne Gee MD [facility]         Battery A Back   Site Antigen [facility] Flare  A1 Positive Control 7 9  A2 Negative Control 3 3  A3 American Elm 0 0  A4 Maple Box Elder 0 0  A5 Grass Mix 0 0  A6 Dock Sorrel Mix 0 0  A7 Russian Thistle 0 0  A8 Ragweed Mix 0 0  A9 English Pantain 0 0  A10 Oak Mix  0 0   Battery B Wheal Flare  B1 Lambs Quarters 0 0  B2 Cottonwood 0 0  B3 Pigweed Mix 0 0  B4 Acacia 0 0  B5 Pine Mix 0 0  B6 Privet 0 0  B7 White/Red Mulberry 0 0  B8 Western Water Hemp 0 0  B9 Guatemala Grass 0 0  B10 Melalucea 0 0   Battery C Wheal Flare  C1 [place] River Birch 0 0  C2 Eastern Sycamore 0 0  C3 Bahai Grass 0 0  C4 American Beech 0 0  C5 Ash Mix 0 0  C6 Black Willow 0 0  C7 Hickory 0 0  C8 Black Walnut 0 0  C9 Red Cedar 0 0  C10 Sweet Gum  0 0   Battery D Wheal Flare  D1 Cultivated Oat 0 0  D2 Dog Fennel 0 0  D3 Common Mugwort 0 0  D4 Marsh Elder 0 0  D5 Johnson 0 0  D6 Hackberry Tree 0 0  D7 Bayberry Tree 0 0  D8 Cypress, Bald Tree 0 0  D9 Aspergillus Fumigatus 0 0  D10 Alternia  0 0   Battery E Wheal Flare  E1 Dreschlere 0 0  E2 Fusarium Mix 0 0  E3 Cladosporum Sph 0 0  E4 Bipolaris 0 0  E5 Penicillin chrys 0 0  E6 Cladosporum Herb 0 0  E7 Candida 0 0  E8 Aureobasidium 0 0  E9 Rhizopus 0 0  E10 Botrytis  0 0   Battery F Wheal Flare  F1 Aspergillus [place] 0 0  F2 Dust Mite Mix 0 0  F3 Cockroach Mix 0 0  F4 Cat Hair 0 0  F5 Dog Mixed breeds 0 0  F6 Feather Mix 0 0

## 2018-07-07 ENCOUNTER — Encounter: Payer: Self-pay | Admitting: Internal Medicine

## 2018-07-07 ENCOUNTER — Ambulatory Visit: Payer: Medicare Other | Admitting: Internal Medicine

## 2018-07-07 VITALS — BP 124/80 | HR 91 | Resp 16 | Ht 74.0 in | Wt 265.0 lb

## 2018-07-07 DIAGNOSIS — R05 Cough: Secondary | ICD-10-CM | POA: Diagnosis not present

## 2018-07-07 DIAGNOSIS — J301 Allergic rhinitis due to pollen: Secondary | ICD-10-CM | POA: Diagnosis not present

## 2018-07-07 DIAGNOSIS — R059 Cough, unspecified: Secondary | ICD-10-CM

## 2018-07-07 DIAGNOSIS — J449 Chronic obstructive pulmonary disease, unspecified: Secondary | ICD-10-CM | POA: Diagnosis not present

## 2018-07-07 DIAGNOSIS — J320 Chronic maxillary sinusitis: Secondary | ICD-10-CM

## 2018-07-07 MED ORDER — PROMETHAZINE-DM 6.25-15 MG/5ML PO SYRP: 5.0000 mL | ORAL_SOLUTION | Freq: Four times a day (QID) | ORAL | 0 refills | Status: DC | PRN

## 2018-07-07 NOTE — Patient Instructions (Signed)

## 2018-07-07 NOTE — Progress Notes (Signed)
[facility] [place], [place] [address]  Pulmonary Sleep Medicine   Office Visit Note  Patient Name: Carlos Romero DOB: [birth year] MRN [id number]  Date of Service: 07/07/2018  Complaints/HPI: Pt is here for follow up on copd.  He currently manges his COPD with albuterol as needed.  He reports he is currently doing well.  Denies cough, wheezes, hemoptysis, or other issues.  His sinuses are clear, and he denies any formal complaints.   ROS  General: (-) fever, (-) chills, (-) night sweats, (-) weakness Skin: (-) rashes, (-) itching,. Eyes: (-) visual changes, (-) redness, (-) itching. Nose and Sinuses: (-) nasal stuffiness or itchiness, (-) postnasal drip, (-) nosebleeds, (-) sinus trouble. Mouth and Throat: (-) sore throat, (-) hoarseness. Neck: (-) swollen glands, (-) enlarged thyroid, (-) neck pain. Respiratory: - cough, (-) bloody sputum, - shortness of breath, - wheezing. Cardiovascular: - ankle swelling, (-) chest pain. Lymphatic: (-) lymph node enlargement. Neurologic: (-) numbness, (-) tingling. Psychiatric: (-) anxiety, (-) depression   Current Medication: Outpatient Encounter Medications as of 07/07/2018  Medication Sig Note  . albuterol (VENTOLIN HFA) 108 (90 Base) MCG/ACT inhaler Inhale 2 puffs into the lungs every 6 (six) hours as needed for wheezing or shortness of breath.   Marland Kitchen azelastine (ASTELIN) 0.1 % nasal spray 1 spray 2 (two) times daily.   Marland Kitchen b complex vitamins tablet Take 1 tablet by mouth daily.   . budesonide-formoterol (SYMBICORT) 160-4.5 MCG/ACT inhaler INHALE 2 PUFFS INTO LUNGS TWICE DAILY   . budesonide-formoterol (SYMBICORT) 160-4.5 MCG/ACT inhaler Inhale 2 puffs into the lungs 2 (two) times daily.   . chlorpheniramine (CHLOR-TRIMETON) 4 MG tablet Take 4 mg by mouth every 4 (four) hours as needed for allergies.   . diphenhydrAMINE (BENADRYL) 25 MG tablet Take 25 mg by mouth at bedtime as needed for allergies.   Marland Kitchen  ipratropium (ATROVENT) 0.06 % nasal spray Place 2 sprays into both nostrils 4 (four) times daily.   Marland Kitchen loperamide (IMODIUM A-D) 2 MG tablet Take 2 mg by mouth 4 (four) times daily as needed for diarrhea or loose stools.   Marland Kitchen loratadine (CLARITIN) 10 MG tablet Take 10 mg by mouth daily.   . mometasone (NASONEX) 50 MCG/ACT nasal spray Place 2 sprays into the nose daily.   . Multiple Vitamins-Minerals (VITAMINS TO GO MEN PO) Take by mouth.   Marland Kitchen omeprazole (PRILOSEC) 20 MG capsule Take 20 mg by mouth daily as needed.    . pantoprazole (PROTONIX) 40 MG tablet TAKE ONE TABLET BY MOUTH ONCE DAILY 30 TO 60 MINUTES BEFORE FIRST MEAL OF THE DAY   . predniSONE (DELTASONE) 10 MG tablet Take 4 tabs for 2 days, then 3 tabs for 2 days, then 2 tabs for 2 days, then 1 tab for 2 days, then stop   . predniSONE (DELTASONE) 20 MG tablet Take 20 mg by mouth 2 (two) times daily.   . valACYclovir (VALTREX) 1000 MG tablet as directed.  12/05/2013: Received from: External Pharmacy  . promethazine-dextromethorphan (PROMETHAZINE-DM) 6.25-15 MG/5ML syrup Take 5 mLs by mouth 4 (four) times daily as needed for cough. (Patient not taking: Reported on 07/07/2018)   . [DISCONTINUED] amoxicillin-clavulanate (AUGMENTIN) 875-125 MG tablet Take 1 tablet by mouth 2 (two) times daily. (Patient not taking: Reported on 07/07/2018)    No facility-administered encounter medications on file as of 07/07/2018.     Surgical History: Past Surgical History:  Procedure Laterality Date  . [place]  History: Past Medical History:  Diagnosis Date  . Barrett's esophagus   . Chronic cough    onset Oct 2010, better after prednisone short course 12-2009, sinus CT 11-06-10 bilateral maxillary, ethmoid and possibly frontal sinusitis>21 days augmentin  . COPD (chronic obstructive pulmonary disease) (HCC)    gold stage 2, PFTs 06-12-09 FEV1 2.25 (65%) ratio 62 and no better with B2  . Depression 1999  . GERD (gastroesophageal  reflux disease)   . Hyperlipidemia   . Lung disease    LLL air space disease  . Prostate cancer ([place]) 2003  . PUD (peptic ulcer disease) 1967   s/p perf  . Pulmonary embolism ([place]) 1967  . Solitary lung nodule    RML    Family History: Family History  Problem Relation Age of Onset  . Cancer Mother        bladder  . Stroke Mother   . Asthma Mother   . Emphysema Other        "everyone"    Social History: Social History   Socioeconomic History  . Marital status: Married    Spouse name: Not on file  . Number of children: Not on file  . Years of education: Not on file  . Highest education level: Not on file  Occupational History  . Occupation: Retail buyer: Retired  Scientific laboratory technician  . Financial resource strain: Not on file  . Food insecurity:    Worry: Not on file    Inability: Not on file  . Transportation needs:    Medical: Not on file    Non-medical: Not on file  Tobacco Use  . Smoking status: Former Smoker    Packs/day: 1.00    Years: 20.00    Pack years: 20.00    Types: Cigarettes    Last attempt to quit: 08/24/1986    Years since quitting: 31.8  . Smokeless tobacco: Never Used  Substance and Sexual Activity  . Alcohol use: No    Alcohol/week: 0.0 standard drinks    Comment: none since 2010  . Drug use: Not on file  . Sexual activity: Not on file  Lifestyle  . Physical activity:    Days per week: Not on file    Minutes per session: Not on file  . Stress: Not on file  Relationships  . Social connections:    Talks on phone: Not on file    Gets together: Not on file    Attends religious service: Not on file    Active member of club or organization: Not on file    Attends meetings of clubs or organizations: Not on file    Relationship status: Not on file  . Intimate partner violence:    Fear of current or ex partner: Not on file    Emotionally abused: Not on file    Physically abused: Not on file    Forced sexual activity: Not on file   Other Topics Concern  . Not on file  Social History Narrative  . Not on file    Vital Signs: Blood pressure 124/80, pulse 91, resp. rate 16, height 6\' 2"  (1.88 m), weight 265 lb (120.2 kg), SpO2 97 %.  Examination: General Appearance: The patient is well-developed, well-nourished, and in no distress. Skin: Gross inspection of skin unremarkable. Head: normocephalic, no gross deformities. Eyes: no gross deformities noted. ENT: ears appear grossly normal no exudates. Neck: Supple. No thyromegaly. No LAD. Respiratory: clear to auscultation bilater. Cardiovascular: Normal S1 and  S2 without murmur or rub. Extremities: No cyanosis. pulses are equal. Neurologic: Alert and oriented. No involuntary movements.  LABS: Recent Results (from the past 2160 hour(s))  Pulmonary function test     Status: None   Collection Time: 06/08/18 11:30 AM  Result Value Ref Range   FEV1     FVC     FEV1/FVC     TLC     DLCO      Radiology: Ct Chest Wo Contrast  Result Date: 07/06/2018 CLINICAL DATA:  Evaluate for pulmonary nodule. Former smoker. Shortness of breath and cough. EXAM: CT CHEST WITHOUT CONTRAST TECHNIQUE: Multidetector CT imaging of the chest was performed following the standard protocol without IV contrast. COMPARISON:  Chest x-ray April 27, 2018 and CT scan November 06, 2010 FINDINGS: Cardiovascular: Coronary artery calcifications in the right coronary artery. The thoracic aorta is nonaneurysmal. The main pulmonary artery is normal in caliber. The heart is unremarkable. Mediastinum/Nodes: Small nodules in the left thyroid lobe measure less than 10 mm, requiring no follow-up. The esophagus is normal. No adenopathy in the chest. No effusions. Lungs/Pleura: Central airways are normal. Minimal emphysematous changes in the apices. Minimal thickening along the minor fissure on series 3, image 83 is unchanged given difference in slice selection since 2012, of no significance. No suspicious pulmonary  nodules, masses, or infiltrates. Upper Abdomen: No acute abnormality. Musculoskeletal: No chest wall mass or suspicious bone lesions identified. IMPRESSION: 1. No suspicious pulmonary nodules. 2. No other significant abnormalities. Electronically Signed   By: Dorise Bullion III M.D   On: 07/06/2018 01:17    Ct Chest Wo Contrast  Result Date: 07/06/2018 CLINICAL DATA:  Evaluate for pulmonary nodule. Former smoker. Shortness of breath and cough. EXAM: CT CHEST WITHOUT CONTRAST TECHNIQUE: Multidetector CT imaging of the chest was performed following the standard protocol without IV contrast. COMPARISON:  Chest x-ray April 27, 2018 and CT scan November 06, 2010 FINDINGS: Cardiovascular: Coronary artery calcifications in the right coronary artery. The thoracic aorta is nonaneurysmal. The main pulmonary artery is normal in caliber. The heart is unremarkable. Mediastinum/Nodes: Small nodules in the left thyroid lobe measure less than 10 mm, requiring no follow-up. The esophagus is normal. No adenopathy in the chest. No effusions. Lungs/Pleura: Central airways are normal. Minimal emphysematous changes in the apices. Minimal thickening along the minor fissure on series 3, image 83 is unchanged given difference in slice selection since 2012, of no significance. No suspicious pulmonary nodules, masses, or infiltrates. Upper Abdomen: No acute abnormality. Musculoskeletal: No chest wall mass or suspicious bone lesions identified. IMPRESSION: 1. No suspicious pulmonary nodules. 2. No other significant abnormalities. Electronically Signed   By: Dorise Bullion III M.D   On: 07/06/2018 01:17    Ct Chest Wo Contrast  Result Date: 07/06/2018 CLINICAL DATA:  Evaluate for pulmonary nodule. Former smoker. Shortness of breath and cough. EXAM: CT CHEST WITHOUT CONTRAST TECHNIQUE: Multidetector CT imaging of the chest was performed following the standard protocol without IV contrast. COMPARISON:  Chest x-ray April 27, 2018  and CT scan November 06, 2010 FINDINGS: Cardiovascular: Coronary artery calcifications in the right coronary artery. The thoracic aorta is nonaneurysmal. The main pulmonary artery is normal in caliber. The heart is unremarkable. Mediastinum/Nodes: Small nodules in the left thyroid lobe measure less than 10 mm, requiring no follow-up. The esophagus is normal. No adenopathy in the chest. No effusions. Lungs/Pleura: Central airways are normal. Minimal emphysematous changes in the apices. Minimal thickening along the minor fissure on series 3,  image 83 is unchanged given difference in slice selection since 2012, of no significance. No suspicious pulmonary nodules, masses, or infiltrates. Upper Abdomen: No acute abnormality. Musculoskeletal: No chest wall mass or suspicious bone lesions identified. IMPRESSION: 1. No suspicious pulmonary nodules. 2. No other significant abnormalities. Electronically Signed   By: Dorise Bullion III M.D   On: 07/06/2018 01:17      Assessment and Plan: Patient Active Problem List   Diagnosis Date Noted  . Pneumonia 12/23/2017  . Obesity (BMI 30-39.9) 04/16/2015  . Pain in lower limb 12/05/2013  . Onychomycosis 08/29/2013  . Pain in toe 08/29/2013  . Plantar fasciitis of left foot 03/27/2013  . Equinus deformity of foot, acquired 03/27/2013  . Acute sinusitis 11/07/2010  . PULMONARY NODULE 01/08/2010  . PROSTATE CANCER 12/12/2009  . HYPERCHOLESTEROLEMIA 12/12/2009  . DEPRESSION 12/12/2009  . PULMONARY EMBOLISM 12/12/2009  . COPD GOLD II  12/12/2009  . GERD 12/12/2009  . BARRETTS ESOPHAGUS 12/12/2009  . Cough 12/12/2009  . PUD, HX OF 12/12/2009   1. Obstructive chronic bronchitis without exacerbation (HCC) Stable, continue to use inhalers as directed.  2. Allergic rhinitis due to pollen, unspecified seasonality Stable, no current issues.  3. Cough Pt reports intermittent cough that is always present.  Continue to manage as discussed.  4. Chronic maxillary  sinusitis No complaints or sinus symptoms at this time.    General Counseling: I have discussed the findings of the evaluation and examination with Gwyndolyn Saxon.  I have also discussed any further diagnostic evaluation thatmay be needed or ordered today. Kourtland verbalizes understanding of the findings of todays visit. We also reviewed his medications today and discussed drug interactions and side effects including but not limited excessive drowsiness and altered mental states. We also discussed that there is always a risk not just to him but also people around him. he has been encouraged to call the office with any questions or concerns that should arise related to todays visit.    Time spent: 25 This patient was seen by Orson Gear AGNP-C in Collaboration with Dr. Devona Konig as a part of collaborative care agreement.   I have personally obtained a history, examined the patient, evaluated laboratory and imaging results, formulated the assessment and plan and placed orders.    Allyne Gee, MD [facility] Pulmonary and Critical Care Sleep medicine

## 2018-07-08 ENCOUNTER — Other Ambulatory Visit: Payer: Self-pay | Admitting: Adult Health

## 2018-07-08 ENCOUNTER — Telehealth: Payer: Self-pay

## 2018-07-08 ENCOUNTER — Other Ambulatory Visit: Payer: Self-pay

## 2018-07-08 MED ORDER — HYDROCOD POLST-CPM POLST ER 10-8 MG/5ML PO SUER: ORAL | 0 refills | Status: DC

## 2018-07-08 NOTE — Telephone Encounter (Signed)
Pt wife  Advised we send new rx

## 2018-07-08 NOTE — Telephone Encounter (Signed)
lmom to call us back

## 2018-09-14 ENCOUNTER — Encounter: Payer: Self-pay | Admitting: Adult Health

## 2018-09-14 ENCOUNTER — Ambulatory Visit (INDEPENDENT_AMBULATORY_CARE_PROVIDER_SITE_OTHER): Payer: Medicare Other | Admitting: Adult Health

## 2018-09-14 VITALS — BP 136/72 | HR 56 | Resp 16 | Ht 74.0 in | Wt 266.0 lb

## 2018-09-14 DIAGNOSIS — J449 Chronic obstructive pulmonary disease, unspecified: Secondary | ICD-10-CM

## 2018-09-14 DIAGNOSIS — R05 Cough: Secondary | ICD-10-CM

## 2018-09-14 DIAGNOSIS — R059 Cough, unspecified: Secondary | ICD-10-CM

## 2018-09-14 DIAGNOSIS — J4489 Other specified chronic obstructive pulmonary disease: Secondary | ICD-10-CM

## 2018-09-14 MED ORDER — HYDROCOD POLST-CPM POLST ER 10-8 MG/5ML PO SUER: ORAL | 0 refills | Status: DC

## 2018-09-14 MED ORDER — ALBUTEROL SULFATE HFA 108 (90 BASE) MCG/ACT IN AERS: 2.0000 | INHALATION_SPRAY | Freq: Four times a day (QID) | RESPIRATORY_TRACT | 2 refills | Status: DC | PRN

## 2018-09-14 NOTE — Progress Notes (Signed)
[facility] [place], [place] [address]  Pulmonary Sleep Medicine   Office Visit Note  Patient Name: Carlos Romero DOB: 19-Jan-1946 MRN [id number]  Date of Service: 09/14/2018  Complaints/HPI: Patient is here for follow-up on COPD.  He also reports continued cough and would like a refill on his Ventolin as well as his Tussionex cough syrup.  He reports taking the Tussionex cough syrup at night before bed approximately 2 times per week.  This helps him sleep and calms his cough.  He is out of his Ventolin inhalers and is requesting a refill which will be provided at this visit.  ROS  General: (-) fever, (-) chills, (-) night sweats, (-) weakness Skin: (-) rashes, (-) itching,. Eyes: (-) visual changes, (-) redness, (-) itching. Nose and Sinuses: (-) nasal stuffiness or itchiness, (-) postnasal drip, (-) nosebleeds, (-) sinus trouble. Mouth and Throat: (-) sore throat, (-) hoarseness. Neck: (-) swollen glands, (-) enlarged thyroid, (-) neck pain. Respiratory: + cough, (-) bloody sputum, - shortness of breath, - wheezing. Cardiovascular: - ankle swelling, (-) chest pain. Lymphatic: (-) lymph node enlargement. Neurologic: (-) numbness, (-) tingling. Psychiatric: (-) anxiety, (-) depression   Current Medication: Outpatient Encounter Medications as of 09/14/2018  Medication Sig Note  . albuterol (VENTOLIN HFA) 108 (90 Base) MCG/ACT inhaler Inhale 2 puffs into the lungs every 6 (six) hours as needed for wheezing or shortness of breath.   Marland Kitchen azelastine (ASTELIN) 0.1 % nasal spray 1 spray 2 (two) times daily.   Marland Kitchen b complex vitamins tablet Take 1 tablet by mouth daily.   . budesonide-formoterol (SYMBICORT) 160-4.5 MCG/ACT inhaler Inhale 2 puffs into the lungs 2 (two) times daily.   . chlorpheniramine (CHLOR-TRIMETON) 4 MG tablet Take 4 mg by mouth every 4 (four) hours as needed for allergies.   . chlorpheniramine-HYDROcodone (TUSSIONEX) 10-8 MG/5ML SUER TAKE 5  MILLILITERS BY MOUTH TWICE A DAY AS NEEDED   . diphenhydrAMINE (BENADRYL) 25 MG tablet Take 25 mg by mouth at bedtime as needed for allergies.   Marland Kitchen ipratropium (ATROVENT) 0.06 % nasal spray Place 2 sprays into both nostrils 4 (four) times daily.   Marland Kitchen loperamide (IMODIUM A-D) 2 MG tablet Take 2 mg by mouth 4 (four) times daily as needed for diarrhea or loose stools.   Marland Kitchen loratadine (CLARITIN) 10 MG tablet Take 10 mg by mouth daily.   . mometasone (NASONEX) 50 MCG/ACT nasal spray Place 2 sprays into the nose daily.   . Multiple Vitamins-Minerals (VITAMINS TO GO MEN PO) Take by mouth.   Marland Kitchen omeprazole (PRILOSEC) 20 MG capsule Take 20 mg by mouth daily as needed.    . pantoprazole (PROTONIX) 40 MG tablet TAKE ONE TABLET BY MOUTH ONCE DAILY 30 TO 60 MINUTES BEFORE FIRST MEAL OF THE DAY   . predniSONE (DELTASONE) 10 MG tablet Take 4 tabs for 2 days, then 3 tabs for 2 days, then 2 tabs for 2 days, then 1 tab for 2 days, then stop   . predniSONE (DELTASONE) 20 MG tablet Take 20 mg by mouth 2 (two) times daily.   . valACYclovir (VALTREX) 1000 MG tablet as directed.  12/05/2013: Received from: External Pharmacy  . [DISCONTINUED] albuterol (VENTOLIN HFA) 108 (90 Base) MCG/ACT inhaler Inhale 2 puffs into the lungs every 6 (six) hours as needed for wheezing or shortness of breath.   . [DISCONTINUED] chlorpheniramine-HYDROcodone (TUSSIONEX) 10-8 MG/5ML SUER TAKE 5 MILLILITERS BY MOUTH TWICE A DAY AS NEEDED    No facility-administered  encounter medications on file as of 09/14/2018.     Surgical History: Past Surgical History:  Procedure Laterality Date  . PROSTATECTOMY  1999    Medical History: Past Medical History:  Diagnosis Date  . Barrett's esophagus   . Chronic cough    onset Oct 2010, better after prednisone short course 12-2009, sinus CT 11-06-10 bilateral maxillary, ethmoid and possibly frontal sinusitis>21 days augmentin  . COPD (chronic obstructive pulmonary disease) (HCC)    gold stage 2, PFTs  06-12-09 FEV1 2.25 (65%) ratio 62 and no better with B2  . Depression 1999  . GERD (gastroesophageal reflux disease)   . Hyperlipidemia   . Lung disease    LLL air space disease  . Prostate cancer ([place]) 2003  . PUD (peptic ulcer disease) 1967   s/p perf  . Pulmonary embolism ([place]) 1967  . Solitary lung nodule    RML    Family History: Family History  Problem Relation Age of Onset  . Cancer Mother        bladder  . Stroke Mother   . Asthma Mother   . Emphysema Other        "everyone"    Social History: Social History   Socioeconomic History  . Marital status: Married    Spouse name: Not on file  . Number of children: Not on file  . Years of education: Not on file  . Highest education level: Not on file  Occupational History  . Occupation: Retail buyer: Retired  Scientific laboratory technician  . Financial resource strain: Not on file  . Food insecurity:    Worry: Not on file    Inability: Not on file  . Transportation needs:    Medical: Not on file    Non-medical: Not on file  Tobacco Use  . Smoking status: Former Smoker    Packs/day: 1.00    Years: 20.00    Pack years: 20.00    Types: Cigarettes    Last attempt to quit: 08/24/1986    Years since quitting: 32.0  . Smokeless tobacco: Never Used  Substance and Sexual Activity  . Alcohol use: No    Alcohol/week: 0.0 standard drinks    Comment: none since 2010  . Drug use: Not on file  . Sexual activity: Not on file  Lifestyle  . Physical activity:    Days per week: Not on file    Minutes per session: Not on file  . Stress: Not on file  Relationships  . Social connections:    Talks on phone: Not on file    Gets together: Not on file    Attends religious service: Not on file    Active member of club or organization: Not on file    Attends meetings of clubs or organizations: Not on file    Relationship status: Not on file  . Intimate partner violence:    Fear of current or ex partner: Not on file     Emotionally abused: Not on file    Physically abused: Not on file    Forced sexual activity: Not on file  Other Topics Concern  . Not on file  Social History Narrative  . Not on file    Vital Signs: Blood pressure 136/72, pulse (!) 56, resp. rate 16, height 6\' 2"  (1.88 m), weight 266 lb (120.7 kg), SpO2 96 %.  Examination: General Appearance: The patient is well-developed, well-nourished, and in no distress. Skin: Gross inspection of skin unremarkable. Head: normocephalic,  no gross deformities. Eyes: no gross deformities noted. ENT: ears appear grossly normal no exudates. Neck: Supple. No thyromegaly. No LAD. Respiratory: clear bilat. Cardiovascular: Normal S1 and S2 without murmur or rub. Extremities: No cyanosis. pulses are equal. Neurologic: Alert and oriented. No involuntary movements.  LABS: No results found for this or any previous visit (from the past 2160 hour(s)).  Radiology: Ct Chest Wo Contrast  Result Date: 07/06/2018 CLINICAL DATA:  Evaluate for pulmonary nodule. Former smoker. Shortness of breath and cough. EXAM: CT CHEST WITHOUT CONTRAST TECHNIQUE: Multidetector CT imaging of the chest was performed following the standard protocol without IV contrast. COMPARISON:  Chest x-ray April 27, 2018 and CT scan November 06, 2010 FINDINGS: Cardiovascular: Coronary artery calcifications in the right coronary artery. The thoracic aorta is nonaneurysmal. The main pulmonary artery is normal in caliber. The heart is unremarkable. Mediastinum/Nodes: Small nodules in the left thyroid lobe measure less than 10 mm, requiring no follow-up. The esophagus is normal. No adenopathy in the chest. No effusions. Lungs/Pleura: Central airways are normal. Minimal emphysematous changes in the apices. Minimal thickening along the minor fissure on series 3, image 83 is unchanged given difference in slice selection since 2012, of no significance. No suspicious pulmonary nodules, masses, or infiltrates.  Upper Abdomen: No acute abnormality. Musculoskeletal: No chest wall mass or suspicious bone lesions identified. IMPRESSION: 1. No suspicious pulmonary nodules. 2. No other significant abnormalities. Electronically Signed   By: Dorise Bullion III M.D   On: 07/06/2018 01:17    No results found.  No results found.    Assessment and Plan: Patient Active Problem List   Diagnosis Date Noted  . Pneumonia 12/23/2017  . Obesity (BMI 30-39.9) 04/16/2015  . Pain in lower limb 12/05/2013  . Onychomycosis 08/29/2013  . Pain in toe 08/29/2013  . Plantar fasciitis of left foot 03/27/2013  . Equinus deformity of foot, acquired 03/27/2013  . Acute sinusitis 11/07/2010  . PULMONARY NODULE 01/08/2010  . PROSTATE CANCER 12/12/2009  . HYPERCHOLESTEROLEMIA 12/12/2009  . DEPRESSION 12/12/2009  . PULMONARY EMBOLISM 12/12/2009  . COPD GOLD II  12/12/2009  . GERD 12/12/2009  . BARRETTS ESOPHAGUS 12/12/2009  . Cough 12/12/2009  . PUD, HX OF 12/12/2009   1. Cough Refill patient's Tussionex.  Discussed that he should not drink any alcohol or use any other narcotic medications with the medicine.  Patient verbalized understanding. - chlorpheniramine-HYDROcodone (TUSSIONEX) 10-8 MG/5ML SUER; TAKE 5 MILLILITERS BY MOUTH TWICE A DAY AS NEEDED  Dispense: 250 mL; Refill: 0 Reviewed risks and possible side effects associated with taking opiates, benzodiazepines and other CNS depressants. Combination of these could cause dizziness and drowsiness. Advised patient not to drive or operate machinery when taking these medications, as patient's and other's life can be at risk and will have consequences. Patient verbalized understanding in this matter. Dependence and abuse for these drugs will be monitored closely. A Controlled substance policy and procedure is on file which allows [facility] to order a urine drug screen test at any visit. Patient understands and agrees with the plan  2. Obstructive chronic  bronchitis without exacerbation (HCC) Stable, continue current medication inhalers as prescribed. - albuterol (VENTOLIN HFA) 108 (90 Base) MCG/ACT inhaler; Inhale 2 puffs into the lungs every 6 (six) hours as needed for wheezing or shortness of breath.  Dispense: 1 Inhaler; Refill: 2  3. COPD GOLD II  Stable, continue current medications as prescribed. - albuterol (VENTOLIN HFA) 108 (90 Base) MCG/ACT inhaler; Inhale 2 puffs into the  lungs every 6 (six) hours as needed for wheezing or shortness of breath.  Dispense: 1 Inhaler; Refill: 2   General Counseling: I have discussed the findings of the evaluation and examination with Gwyndolyn Saxon.  I have also discussed any further diagnostic evaluation thatmay be needed or ordered today. Yesenia verbalizes understanding of the findings of todays visit. We also reviewed his medications today and discussed drug interactions and side effects including but not limited excessive drowsiness and altered mental states. We also discussed that there is always a risk not just to him but also people around him. he has been encouraged to call the office with any questions or concerns that should arise related to todays visit.    Time spent: 25  I have personally obtained a history, examined the patient, evaluated laboratory and imaging results, formulated the assessment and plan and placed orders.    Allyne Gee, MD [facility] Pulmonary and Critical Care Sleep medicine

## 2018-09-28 ENCOUNTER — Ambulatory Visit: Payer: Medicare Other | Admitting: Internal Medicine

## 2018-10-10 ENCOUNTER — Ambulatory Visit: Payer: Self-pay | Admitting: Internal Medicine

## 2018-12-12 ENCOUNTER — Ambulatory Visit: Payer: Self-pay | Admitting: Internal Medicine

## 2019-01-12 ENCOUNTER — Ambulatory Visit: Payer: Self-pay | Admitting: Internal Medicine

## 2019-05-11 ENCOUNTER — Other Ambulatory Visit: Payer: Self-pay

## 2019-05-11 ENCOUNTER — Ambulatory Visit: Payer: Medicare Other | Admitting: Internal Medicine

## 2019-05-11 ENCOUNTER — Encounter: Payer: Self-pay | Admitting: Internal Medicine

## 2019-05-11 VITALS — BP 132/88 | HR 80 | Resp 16 | Ht 74.0 in | Wt 270.0 lb

## 2019-05-11 DIAGNOSIS — J449 Chronic obstructive pulmonary disease, unspecified: Secondary | ICD-10-CM | POA: Diagnosis not present

## 2019-05-11 DIAGNOSIS — R0602 Shortness of breath: Secondary | ICD-10-CM | POA: Diagnosis not present

## 2019-05-11 DIAGNOSIS — R059 Cough, unspecified: Secondary | ICD-10-CM

## 2019-05-11 DIAGNOSIS — J4489 Other specified chronic obstructive pulmonary disease: Secondary | ICD-10-CM

## 2019-05-11 DIAGNOSIS — R05 Cough: Secondary | ICD-10-CM

## 2019-05-11 MED ORDER — TRELEGY ELLIPTA 100-62.5-25 MCG/INH IN AEPB: 1.0000 | INHALATION_SPRAY | Freq: Every day | RESPIRATORY_TRACT | 2 refills | Status: DC

## 2019-05-11 MED ORDER — HYDROCOD POLST-CPM POLST ER 10-8 MG/5ML PO SUER: ORAL | 0 refills | Status: DC

## 2019-05-11 NOTE — Progress Notes (Signed)
[facility] [place], [place] [address]  Pulmonary Sleep Medicine   Office Visit Note  Patient Name: Carlos Romero DOB: 1946/01/20 MRN [id number]  Date of Service: 05/11/2019  Complaints/HPI: Pt is here for follow up. He reports in the last 3 weeks he has been using his rescue inhaler 2 puffs at night.  Patient has been using Symbicort as maintenance therapy however does not believe this is working anymore.  Has used Trelegy in the past but was unable to afford to keep getting the medication.  He would like to go back on that at this time.     ROS  General: (-) fever, (-) chills, (-) night sweats, (-) weakness Skin: (-) rashes, (-) itching,. Eyes: (-) visual changes, (-) redness, (-) itching. Nose and Sinuses: (-) nasal stuffiness or itchiness, (-) postnasal drip, (-) nosebleeds, (-) sinus trouble. Mouth and Throat: (-) sore throat, (-) hoarseness. Neck: (-) swollen glands, (-) enlarged thyroid, (-) neck pain. Respiratory: - cough, (-) bloody sputum, - shortness of breath, - wheezing. Cardiovascular: - ankle swelling, (-) chest pain. Lymphatic: (-) lymph node enlargement. Neurologic: (-) numbness, (-) tingling. Psychiatric: (-) anxiety, (-) depression   Current Medication: Outpatient Encounter Medications as of 05/11/2019  Medication Sig Note  . albuterol (VENTOLIN HFA) 108 (90 Base) MCG/ACT inhaler Inhale 2 puffs into the lungs every 6 (six) hours as needed for wheezing or shortness of breath.   Marland Kitchen azelastine (ASTELIN) 0.1 % nasal spray 1 spray 2 (two) times daily.   Marland Kitchen b complex vitamins tablet Take 1 tablet by mouth daily.   . budesonide-formoterol (SYMBICORT) 160-4.5 MCG/ACT inhaler Inhale 2 puffs into the lungs 2 (two) times daily.   . chlorpheniramine (CHLOR-TRIMETON) 4 MG tablet Take 4 mg by mouth every 4 (four) hours as needed for allergies.   . chlorpheniramine-HYDROcodone (TUSSIONEX) 10-8 MG/5ML SUER TAKE 5 MILLILITERS BY MOUTH TWICE A DAY AS  NEEDED   . diphenhydrAMINE (BENADRYL) 25 MG tablet Take 25 mg by mouth at bedtime as needed for allergies.   Marland Kitchen ipratropium (ATROVENT) 0.06 % nasal spray Place 2 sprays into both nostrils 4 (four) times daily.   Marland Kitchen loperamide (IMODIUM A-D) 2 MG tablet Take 2 mg by mouth 4 (four) times daily as needed for diarrhea or loose stools.   Marland Kitchen loratadine (CLARITIN) 10 MG tablet Take 10 mg by mouth daily.   . mometasone (NASONEX) 50 MCG/ACT nasal spray Place 2 sprays into the nose daily.   . Multiple Vitamins-Minerals (VITAMINS TO GO MEN PO) Take by mouth.   Marland Kitchen omeprazole (PRILOSEC) 20 MG capsule Take 20 mg by mouth daily as needed.    . pantoprazole (PROTONIX) 40 MG tablet TAKE ONE TABLET BY MOUTH ONCE DAILY 30 TO 60 MINUTES BEFORE FIRST MEAL OF THE DAY   . predniSONE (DELTASONE) 10 MG tablet Take 4 tabs for 2 days, then 3 tabs for 2 days, then 2 tabs for 2 days, then 1 tab for 2 days, then stop   . predniSONE (DELTASONE) 20 MG tablet Take 20 mg by mouth 2 (two) times daily.   . valACYclovir (VALTREX) 1000 MG tablet as directed.  12/05/2013: Received from: External Pharmacy   No facility-administered encounter medications on file as of 05/11/2019.     Surgical History: Past Surgical History:  Procedure Laterality Date  . PROSTATECTOMY  1999    Medical History: Past Medical History:  Diagnosis Date  . Barrett's esophagus   . Chronic cough    onset Oct  2010, better after prednisone short course 12-2009, sinus CT 11-06-10 bilateral maxillary, ethmoid and possibly frontal sinusitis>21 days augmentin  . COPD (chronic obstructive pulmonary disease) (HCC)    gold stage 2, PFTs 06-12-09 FEV1 2.25 (65%) ratio 62 and no better with B2  . Depression 1999  . GERD (gastroesophageal reflux disease)   . Hyperlipidemia   . Lung disease    LLL air space disease  . Prostate cancer ([place]) 2003  . PUD (peptic ulcer disease) 1967   s/p perf  . Pulmonary embolism ([place]) 1967  . Solitary lung nodule    RML     Family History: Family History  Problem Relation Age of Onset  . Cancer Mother        bladder  . Stroke Mother   . Asthma Mother   . Emphysema Other        "everyone"    Social History: Social History   Socioeconomic History  . Marital status: Married    Spouse name: Not on file  . Number of children: Not on file  . Years of education: Not on file  . Highest education level: Not on file  Occupational History  . Occupation: Retail buyer: Retired  Scientific laboratory technician  . Financial resource strain: Not on file  . Food insecurity    Worry: Not on file    Inability: Not on file  . Transportation needs    Medical: Not on file    Non-medical: Not on file  Tobacco Use  . Smoking status: Former Smoker    Packs/day: 1.00    Years: 20.00    Pack years: 20.00    Types: Cigarettes    Quit date: 08/24/1986    Years since quitting: 32.7  . Smokeless tobacco: Never Used  Substance and Sexual Activity  . Alcohol use: No    Alcohol/week: 0.0 standard drinks    Comment: none since 2010  . Drug use: Not on file  . Sexual activity: Not on file  Lifestyle  . Physical activity    Days per week: Not on file    Minutes per session: Not on file  . Stress: Not on file  Relationships  . Social Herbalist on phone: Not on file    Gets together: Not on file    Attends religious service: Not on file    Active member of club or organization: Not on file    Attends meetings of clubs or organizations: Not on file    Relationship status: Not on file  . Intimate partner violence    Fear of current or ex partner: Not on file    Emotionally abused: Not on file    Physically abused: Not on file    Forced sexual activity: Not on file  Other Topics Concern  . Not on file  Social History Narrative  . Not on file    Vital Signs: Blood pressure 132/88, pulse 80, resp. rate 16, height 6\' 2"  (1.88 m), weight 270 lb (122.5 kg), SpO2 97 %.  Examination: General Appearance:  The patient is well-developed, well-nourished, and in no distress. Skin: Gross inspection of skin unremarkable. Head: normocephalic, no gross deformities. Eyes: no gross deformities noted. ENT: ears appear grossly normal no exudates. Neck: Supple. No thyromegaly. No LAD. Respiratory: clear bilaterally. Cardiovascular: Normal S1 and S2 without murmur or rub. Extremities: No cyanosis. pulses are equal. Neurologic: Alert and oriented. No involuntary movements.  LABS: No results found for this  or any previous visit (from the past 2160 hour(s)).  Radiology: Ct Chest Wo Contrast  Result Date: 07/06/2018 CLINICAL DATA:  Evaluate for pulmonary nodule. Former smoker. Shortness of breath and cough. EXAM: CT CHEST WITHOUT CONTRAST TECHNIQUE: Multidetector CT imaging of the chest was performed following the standard protocol without IV contrast. COMPARISON:  Chest x-ray April 27, 2018 and CT scan November 06, 2010 FINDINGS: Cardiovascular: Coronary artery calcifications in the right coronary artery. The thoracic aorta is nonaneurysmal. The main pulmonary artery is normal in caliber. The heart is unremarkable. Mediastinum/Nodes: Small nodules in the left thyroid lobe measure less than 10 mm, requiring no follow-up. The esophagus is normal. No adenopathy in the chest. No effusions. Lungs/Pleura: Central airways are normal. Minimal emphysematous changes in the apices. Minimal thickening along the minor fissure on series 3, image 83 is unchanged given difference in slice selection since 2012, of no significance. No suspicious pulmonary nodules, masses, or infiltrates. Upper Abdomen: No acute abnormality. Musculoskeletal: No chest wall mass or suspicious bone lesions identified. IMPRESSION: 1. No suspicious pulmonary nodules. 2. No other significant abnormalities. Electronically Signed   By: Dorise Bullion III M.D   On: 07/06/2018 01:17    No results found.  No results found.    Assessment and  Plan: Patient Active Problem List   Diagnosis Date Noted  . Pneumonia 12/23/2017  . Obesity (BMI 30-39.9) 04/16/2015  . Pain in lower limb 12/05/2013  . Onychomycosis 08/29/2013  . Pain in toe 08/29/2013  . Plantar fasciitis of left foot 03/27/2013  . Equinus deformity of foot, acquired 03/27/2013  . Acute sinusitis 11/07/2010  . PULMONARY NODULE 01/08/2010  . PROSTATE CANCER 12/12/2009  . HYPERCHOLESTEROLEMIA 12/12/2009  . DEPRESSION 12/12/2009  . PULMONARY EMBOLISM 12/12/2009  . COPD GOLD II  12/12/2009  . GERD 12/12/2009  . BARRETTS ESOPHAGUS 12/12/2009  . Cough 12/12/2009  . PUD, HX OF 12/12/2009    1. Obstructive chronic bronchitis without exacerbation ([place]) Use Trelegy as prescribed, follow up in a few weeks.  - Fluticasone-Umeclidin-Vilant (TRELEGY ELLIPTA) 100-62.5-25 MCG/INH AEPB; Inhale 1 puff into the lungs daily.  Dispense: 60 each; Refill: 2  2. SOB (shortness of breath) - Spirometry with Graph  3. Cough May use Tussionex to help with cough especially at night for sleeping. Advised patient not to overuse this medicine and not to mix with other medications or alcohol as it can cause respiratory distress, sleepiness or dizziness. Should also avoid driving. Patient voiced understanding and agreement.  - chlorpheniramine-HYDROcodone (TUSSIONEX) 10-8 MG/5ML SUER; TAKE 5 MILLILITERS BY MOUTH TWICE A DAY AS NEEDED  Dispense: 250 mL; Refill: 0  General Counseling: I have discussed the findings of the evaluation and examination with Gwyndolyn Saxon.  I have also discussed any further diagnostic evaluation thatmay be needed or ordered today. Nuno verbalizes understanding of the findings of todays visit. We also reviewed his medications today and discussed drug interactions and side effects including but not limited excessive drowsiness and altered mental states. We also discussed that there is always a risk not just to him but also people around him. he has been encouraged to call  the office with any questions or concerns that should arise related to todays visit.    Time spent: 20  I have personally obtained a history, examined the patient, evaluated laboratory and imaging results, formulated the assessment and plan and placed orders.    Allyne Gee, MD [facility] Pulmonary and Critical Care Sleep medicine

## 2019-06-07 ENCOUNTER — Other Ambulatory Visit: Payer: Self-pay

## 2019-06-07 ENCOUNTER — Ambulatory Visit: Payer: Medicare Other | Admitting: Internal Medicine

## 2019-06-21 ENCOUNTER — Ambulatory Visit (INDEPENDENT_AMBULATORY_CARE_PROVIDER_SITE_OTHER): Payer: Medicare Other | Admitting: Internal Medicine

## 2019-06-21 ENCOUNTER — Other Ambulatory Visit: Payer: Self-pay

## 2019-06-21 DIAGNOSIS — R0602 Shortness of breath: Secondary | ICD-10-CM

## 2019-06-25 IMAGING — CT CT CHEST W/O CM
2 of 4 series · 15 of 36 positions shown, 18 images · non-contrast
Comparison: Chest x-ray April 27, 2018 and CT scan November 06, 2010

CLINICAL DATA: Evaluate for pulmonary nodule. Former smoker.
Shortness of breath and cough.

EXAM:
CT CHEST WITHOUT CONTRAST
TECHNIQUE: Multidetector CT imaging of the chest was performed following the
standard protocol without IV contrast.

[Series 2: chest · axial · 0.74mm/px · z∈[-1256,-970]mm · 12 of 169 slices shown, 15 images (1 of 2)]
[im 13/169  mediastinal]
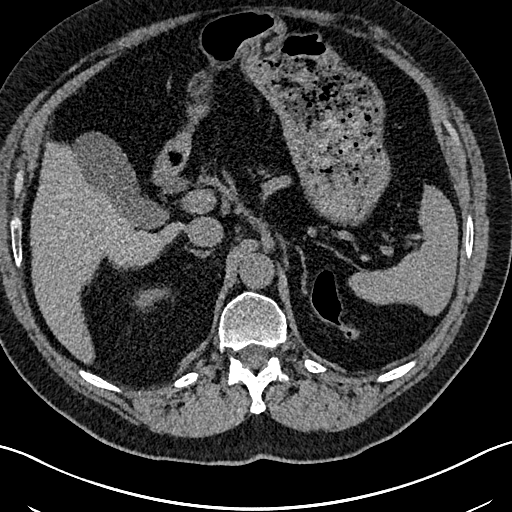
[im 13/169  lung]
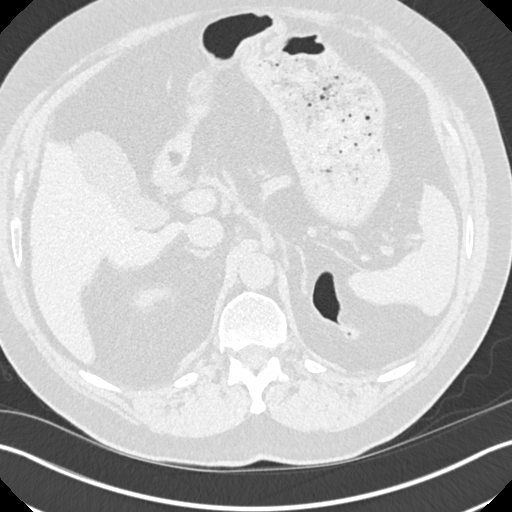
[im 26/169  lung]
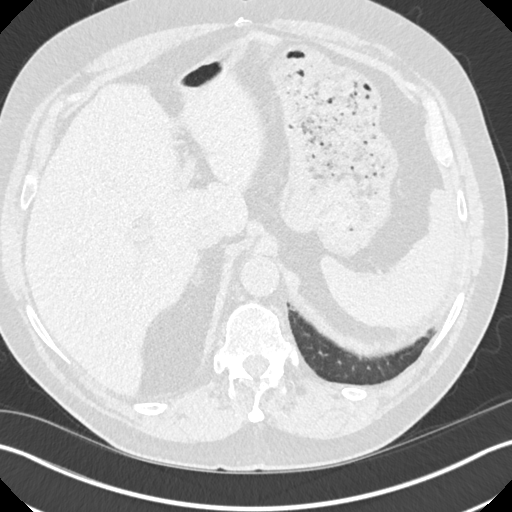
[im 39/169  lung]
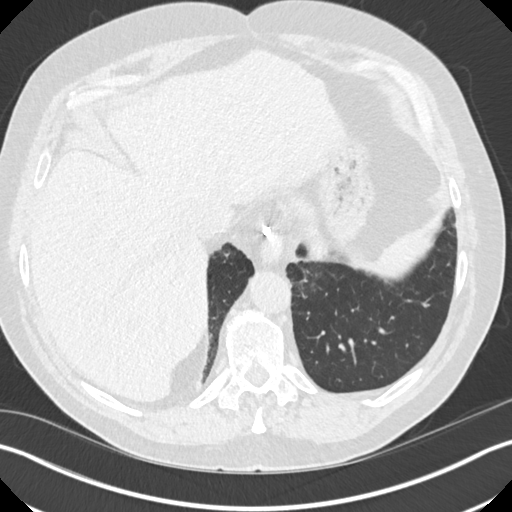
[im 52/169  lung]
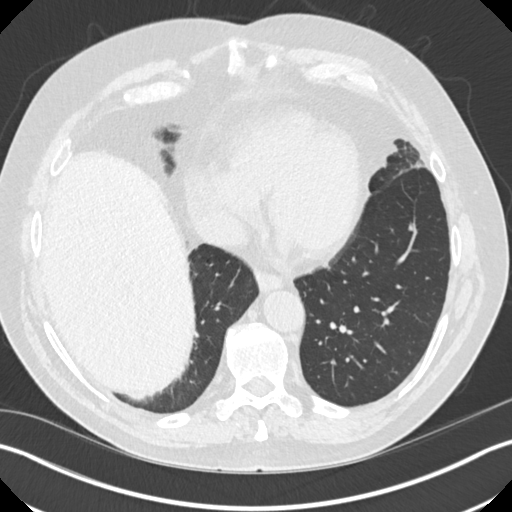
[im 65/169  mediastinal]
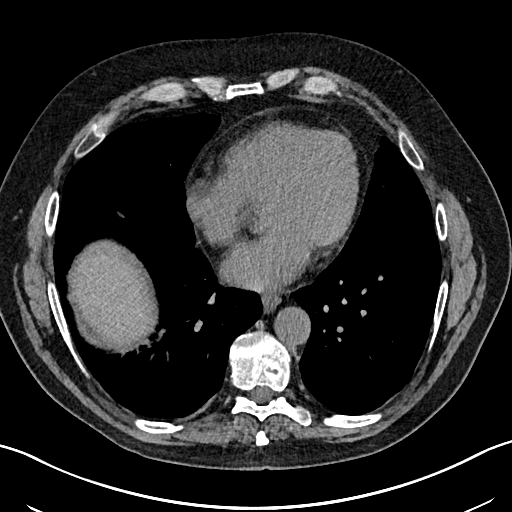
[im 65/169  lung]
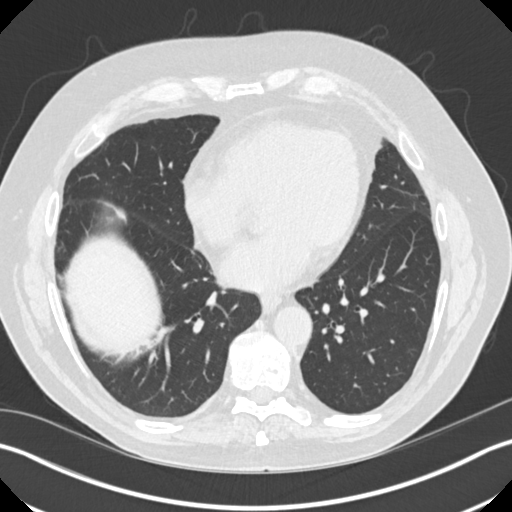
[im 78/169  lung]
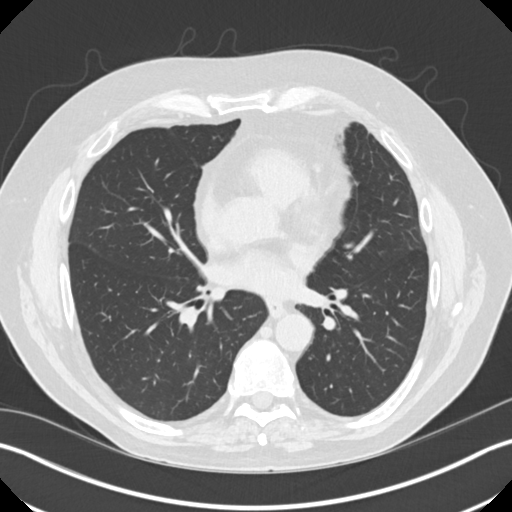
[im 91/169  lung]
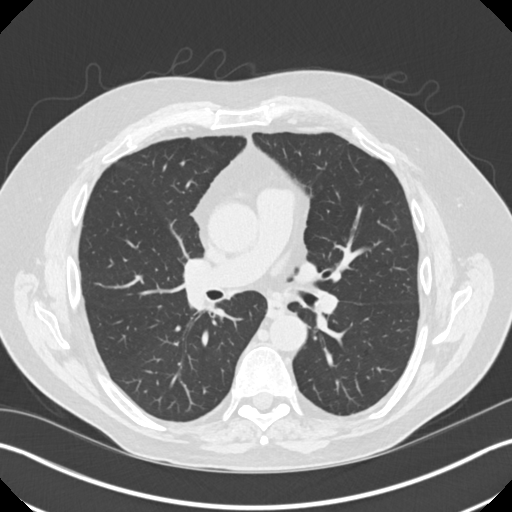
[im 104/169  lung]
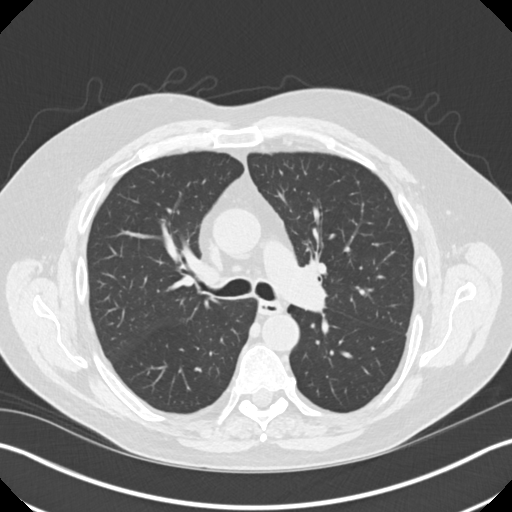
[im 117/169  mediastinal]
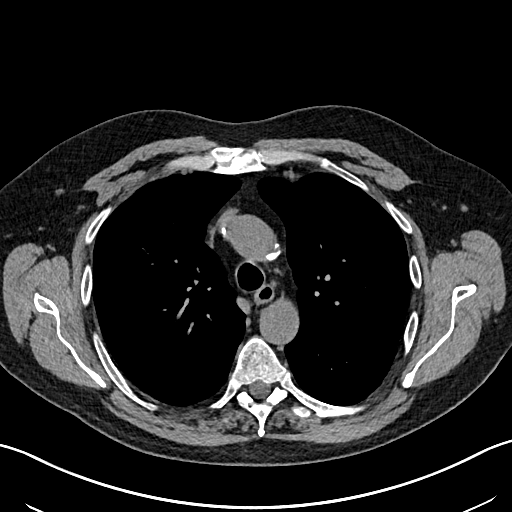
[im 117/169  lung]
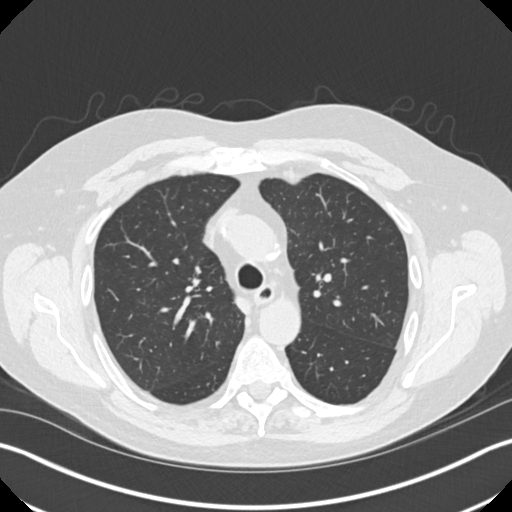
[im 130/169  lung]
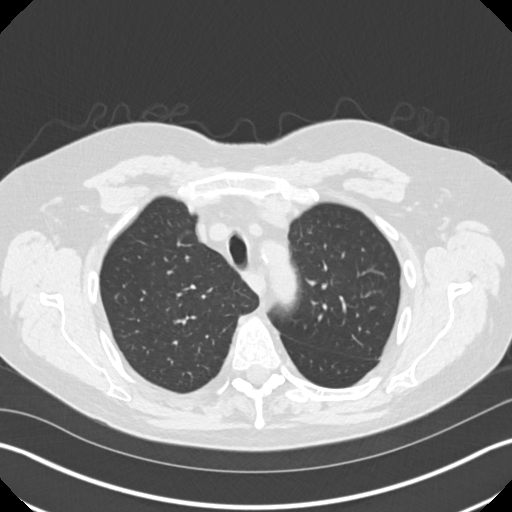
[im 143/169  lung]
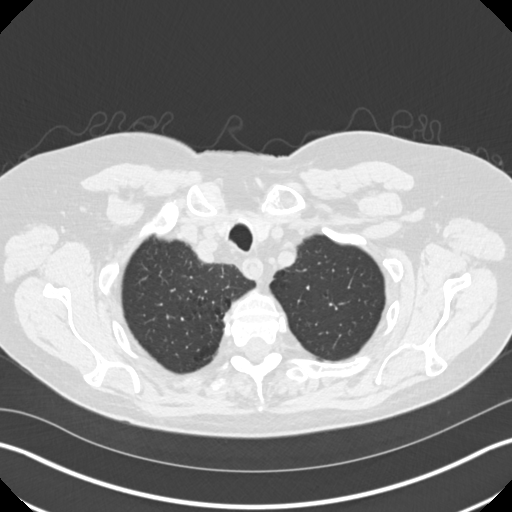
[im 156/169  lung]
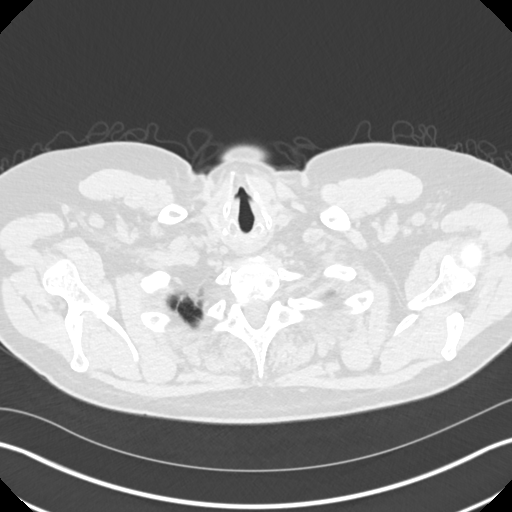

[Series 5: chest · coronal · 0.66mm/px · 3 of 150 slices shown (2 of 2)]
[im 30/150  lung]
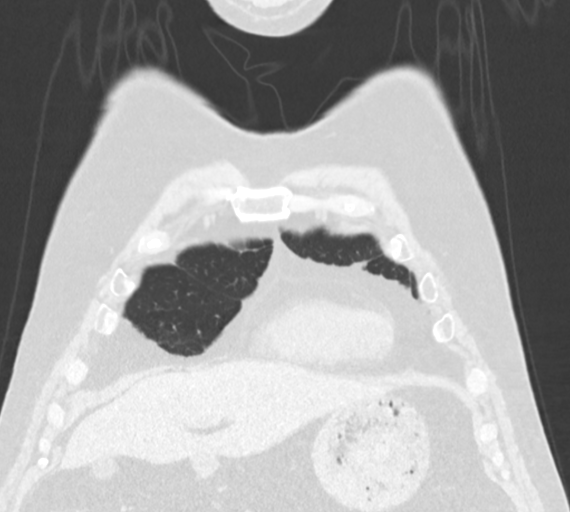
[im 60/150  lung]
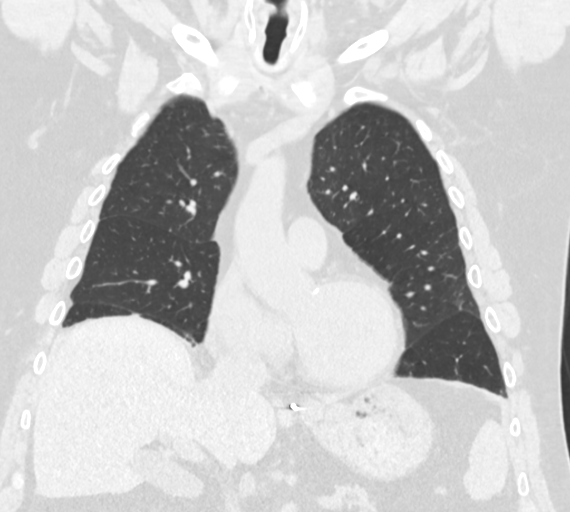
[im 90/150  lung]
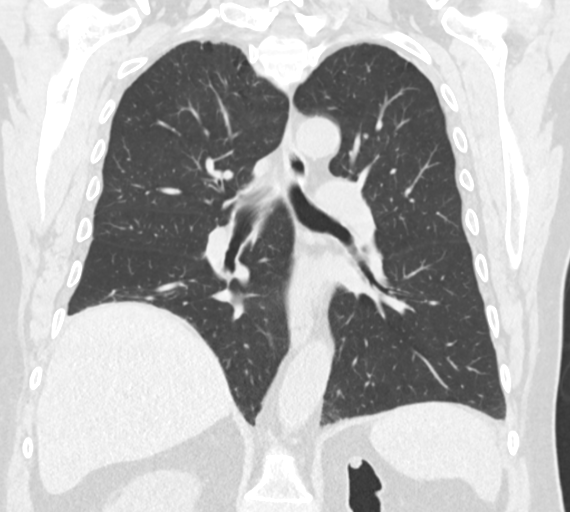

[15 of 36 positions shown; findings below may reference images not displayed]

FINDINGS: Cardiovascular: Coronary artery calcifications in the right coronary
artery. The thoracic aorta is nonaneurysmal. The main pulmonary
artery is normal in caliber. The heart is unremarkable.

Mediastinum/Nodes: Small nodules in the left thyroid lobe measure
less than 10 mm, requiring no follow-up. The esophagus is normal. No
adenopathy in the chest. No effusions.

Lungs/Pleura: Central airways are normal. Minimal emphysematous
changes in the apices. Minimal thickening along the minor fissure on
series 3, image 83 is unchanged given difference in slice selection
since 7737, of no significance. No suspicious pulmonary nodules,
masses, or infiltrates.

Upper Abdomen: No acute abnormality.

Musculoskeletal: No chest wall mass or suspicious bone lesions
identified.
IMPRESSION: 1. No suspicious pulmonary nodules.
2. No other significant abnormalities.

## 2019-06-29 ENCOUNTER — Encounter: Payer: Self-pay | Admitting: Internal Medicine

## 2019-06-29 ENCOUNTER — Other Ambulatory Visit: Payer: Self-pay

## 2019-06-29 ENCOUNTER — Ambulatory Visit: Payer: Medicare Other | Admitting: Internal Medicine

## 2019-06-29 VITALS — BP 140/80 | HR 80 | Temp 97.0°F | Resp 16 | Ht 74.0 in | Wt 274.0 lb

## 2019-06-29 DIAGNOSIS — J449 Chronic obstructive pulmonary disease, unspecified: Secondary | ICD-10-CM

## 2019-06-29 DIAGNOSIS — J301 Allergic rhinitis due to pollen: Secondary | ICD-10-CM

## 2019-06-29 NOTE — Progress Notes (Signed)
[facility] [place], [place] [address]  Pulmonary Sleep Medicine   Office Visit Note  Patient Name: Carlos Romero DOB: 05-02-1946 MRN [id number]  Date of Service: 06/29/2019  Complaints/HPI: Pt is here for follow up. Pt reports he was having severe coughing spells with his trelegy inhaler.  He stopped this on his own, and his coughing has improved drastically. He continues to use his albuterol rescue inhaler, on average he reports using it once weekly. He denies any other symptoms at this time.     ROS  General: (-) fever, (-) chills, (-) night sweats, (-) weakness Skin: (-) rashes, (-) itching,. Eyes: (-) visual changes, (-) redness, (-) itching. Nose and Sinuses: (-) nasal stuffiness or itchiness, (-) postnasal drip, (-) nosebleeds, (-) sinus trouble. Mouth and Throat: (-) sore throat, (-) hoarseness. Neck: (-) swollen glands, (-) enlarged thyroid, (-) neck pain. Respiratory: - cough, (-) bloody sputum, - shortness of breath, - wheezing. Cardiovascular: - ankle swelling, (-) chest pain. Lymphatic: (-) lymph node enlargement. Neurologic: (-) numbness, (-) tingling. Psychiatric: (-) anxiety, (-) depression   Current Medication: Outpatient Encounter Medications as of 06/29/2019  Medication Sig Note  . albuterol (VENTOLIN HFA) 108 (90 Base) MCG/ACT inhaler Inhale 2 puffs into the lungs every 6 (six) hours as needed for wheezing or shortness of breath.   Marland Kitchen azelastine (ASTELIN) 0.1 % nasal spray 1 spray 2 (two) times daily.   Marland Kitchen b complex vitamins tablet Take 1 tablet by mouth daily.   . chlorpheniramine (CHLOR-TRIMETON) 4 MG tablet Take 4 mg by mouth every 4 (four) hours as needed for allergies.   . chlorpheniramine-HYDROcodone (TUSSIONEX) 10-8 MG/5ML SUER TAKE 5 MILLILITERS BY MOUTH TWICE A DAY AS NEEDED   . diphenhydrAMINE (BENADRYL) 25 MG tablet Take 25 mg by mouth at bedtime as needed for allergies.   . Fluticasone-Umeclidin-Vilant (TRELEGY  ELLIPTA) 100-62.5-25 MCG/INH AEPB Inhale 1 puff into the lungs daily.   Marland Kitchen ipratropium (ATROVENT) 0.06 % nasal spray Place 2 sprays into both nostrils 4 (four) times daily.   Marland Kitchen loperamide (IMODIUM A-D) 2 MG tablet Take 2 mg by mouth 4 (four) times daily as needed for diarrhea or loose stools.   Marland Kitchen loratadine (CLARITIN) 10 MG tablet Take 10 mg by mouth daily.   . mometasone (NASONEX) 50 MCG/ACT nasal spray Place 2 sprays into the nose daily.   . Multiple Vitamins-Minerals (VITAMINS TO GO MEN PO) Take by mouth.   Marland Kitchen omeprazole (PRILOSEC) 20 MG capsule Take 20 mg by mouth daily as needed.    . pantoprazole (PROTONIX) 40 MG tablet TAKE ONE TABLET BY MOUTH ONCE DAILY 30 TO 60 MINUTES BEFORE FIRST MEAL OF THE DAY   . predniSONE (DELTASONE) 10 MG tablet Take 4 tabs for 2 days, then 3 tabs for 2 days, then 2 tabs for 2 days, then 1 tab for 2 days, then stop   . predniSONE (DELTASONE) 20 MG tablet Take 20 mg by mouth 2 (two) times daily.   . valACYclovir (VALTREX) 1000 MG tablet as directed.  12/05/2013: Received from: External Pharmacy   No facility-administered encounter medications on file as of 06/29/2019.     Surgical History: Past Surgical History:  Procedure Laterality Date  . PROSTATECTOMY  1999    Medical History: Past Medical History:  Diagnosis Date  . Barrett's esophagus   . Chronic cough    onset Oct 2010, better after prednisone short course 12-2009, sinus CT 11-06-10 bilateral maxillary, ethmoid and possibly frontal sinusitis>21  days augmentin  . COPD (chronic obstructive pulmonary disease) (HCC)    gold stage 2, PFTs 06-12-09 FEV1 2.25 (65%) ratio 62 and no better with B2  . Depression 1999  . GERD (gastroesophageal reflux disease)   . Hyperlipidemia   . Lung disease    LLL air space disease  . Prostate cancer ([place]) 2003  . PUD (peptic ulcer disease) 1967   s/p perf  . Pulmonary embolism ([place]) 1967  . Solitary lung nodule    RML    Family History: Family History  Problem  Relation Age of Onset  . Cancer Mother        bladder  . Stroke Mother   . Asthma Mother   . Emphysema Other        "everyone"    Social History: Social History   Socioeconomic History  . Marital status: Married    Spouse name: Not on file  . Number of children: Not on file  . Years of education: Not on file  . Highest education level: Not on file  Occupational History  . Occupation: Retail buyer: Retired  Scientific laboratory technician  . Financial resource strain: Not on file  . Food insecurity    Worry: Not on file    Inability: Not on file  . Transportation needs    Medical: Not on file    Non-medical: Not on file  Tobacco Use  . Smoking status: Former Smoker    Packs/day: 1.00    Years: 20.00    Pack years: 20.00    Types: Cigarettes    Quit date: 08/24/1986    Years since quitting: 32.8  . Smokeless tobacco: Never Used  Substance and Sexual Activity  . Alcohol use: No    Alcohol/week: 0.0 standard drinks    Comment: none since 2010  . Drug use: Not on file  . Sexual activity: Not on file  Lifestyle  . Physical activity    Days per week: Not on file    Minutes per session: Not on file  . Stress: Not on file  Relationships  . Social Herbalist on phone: Not on file    Gets together: Not on file    Attends religious service: Not on file    Active member of club or organization: Not on file    Attends meetings of clubs or organizations: Not on file    Relationship status: Not on file  . Intimate partner violence    Fear of current or ex partner: Not on file    Emotionally abused: Not on file    Physically abused: Not on file    Forced sexual activity: Not on file  Other Topics Concern  . Not on file  Social History Narrative  . Not on file    Vital Signs: Blood pressure 140/80, pulse 80, temperature (!) 97 F (36.1 C), resp. rate 16, height 6\' 2"  (1.88 m), weight 274 lb (124.3 kg), SpO2 98 %.  Examination: General Appearance: The patient is  well-developed, well-nourished, and in no distress. Skin: Gross inspection of skin unremarkable. Head: normocephalic, no gross deformities. Eyes: no gross deformities noted. ENT: ears appear grossly normal no exudates. Neck: Supple. No thyromegaly. No LAD. Respiratory: clear bilaterally. Cardiovascular: Normal S1 and S2 without murmur or rub. Extremities: No cyanosis. pulses are equal. Neurologic: Alert and oriented. No involuntary movements.  LABS: No results found for this or any previous visit (from the past 2160 hour(s)).  Radiology:  Ct Chest Wo Contrast  Result Date: 07/06/2018 CLINICAL DATA:  Evaluate for pulmonary nodule. Former smoker. Shortness of breath and cough. EXAM: CT CHEST WITHOUT CONTRAST TECHNIQUE: Multidetector CT imaging of the chest was performed following the standard protocol without IV contrast. COMPARISON:  Chest x-ray April 27, 2018 and CT scan November 06, 2010 FINDINGS: Cardiovascular: Coronary artery calcifications in the right coronary artery. The thoracic aorta is nonaneurysmal. The main pulmonary artery is normal in caliber. The heart is unremarkable. Mediastinum/Nodes: Small nodules in the left thyroid lobe measure less than 10 mm, requiring no follow-up. The esophagus is normal. No adenopathy in the chest. No effusions. Lungs/Pleura: Central airways are normal. Minimal emphysematous changes in the apices. Minimal thickening along the minor fissure on series 3, image 83 is unchanged given difference in slice selection since 2012, of no significance. No suspicious pulmonary nodules, masses, or infiltrates. Upper Abdomen: No acute abnormality. Musculoskeletal: No chest wall mass or suspicious bone lesions identified. IMPRESSION: 1. No suspicious pulmonary nodules. 2. No other significant abnormalities. Electronically Signed   By: Dorise Bullion III M.D   On: 07/06/2018 01:17    No results found.  No results found.    Assessment and Plan: Patient Active  Problem List   Diagnosis Date Noted  . Pneumonia 12/23/2017  . Obesity (BMI 30-39.9) 04/16/2015  . Pain in lower limb 12/05/2013  . Onychomycosis 08/29/2013  . Pain in toe 08/29/2013  . Plantar fasciitis of left foot 03/27/2013  . Equinus deformity of foot, acquired 03/27/2013  . Acute sinusitis 11/07/2010  . PULMONARY NODULE 01/08/2010  . PROSTATE CANCER 12/12/2009  . HYPERCHOLESTEROLEMIA 12/12/2009  . DEPRESSION 12/12/2009  . PULMONARY EMBOLISM 12/12/2009  . COPD GOLD II  12/12/2009  . GERD 12/12/2009  . BARRETTS ESOPHAGUS 12/12/2009  . Cough 12/12/2009  . PUD, HX OF 12/12/2009    1. Obstructive chronic bronchitis without exacerbation ([place]) Continue to use albuterol as needed.  We discussed other options for inhalers, patient is not interested in trying any other medications at this time.  He will follow up as discussed.     2. Seasonal allergic rhinitis due to pollen Stable, continue using allegra as prescribed.   General Counseling: I have discussed the findings of the evaluation and examination with Carlos Romero.  I have also discussed any further diagnostic evaluation thatmay be needed or ordered today. Carlos Romero verbalizes understanding of the findings of todays visit. We also reviewed his medications today and discussed drug interactions and side effects including but not limited excessive drowsiness and altered mental states. We also discussed that there is always a risk not just to him but also people around him. he has been encouraged to call the office with any questions or concerns that should arise related to todays visit.    Time spent: 15 This patient was seen by Orson Gear AGNP-C in Collaboration with Dr. Devona Konig as a part of collaborative care agreement.   I have personally obtained a history, examined the patient, evaluated laboratory and imaging results, formulated the assessment and plan and placed orders.    Allyne Gee, MD [facility] Pulmonary and Critical  Care Sleep medicine

## 2019-07-12 NOTE — Procedures (Signed)
[facility] [place], [address]  DATE OF SERVICE: June 21, 2019  Complete Pulmonary Function Testing Interpretation:  FINDINGS:  The forced vital capacity is mildly decreased.  FEV1 is moderately decreased.  FEV1 FVC ratio is normal.  Postbronchodilator there is no significant improvement in the FEV1 however clinical improvement may still occur in the absence of spirometric improvement.  Total lung capacity is normal residual volume is increased residual in total lung capacity ratio is increased FRC is increased DLCO is mildly decreased.  DLCO corrected for alveolar volume is normal.  IMPRESSION:  This pulmonary function study is within normal limits.  Review of the flow volume loop suggest a effort related reduction in the FEV1 and forced vital capacity clinical correlation is recommended.  DLCO corrected for alveolar volume is normal  Allyne Gee, MD [facility] Pulmonary Critical Care Medicine Sleep Medicine

## 2019-07-13 ENCOUNTER — Telehealth: Payer: Self-pay

## 2019-07-14 ENCOUNTER — Other Ambulatory Visit: Payer: Self-pay | Admitting: Adult Health

## 2019-07-14 ENCOUNTER — Encounter: Payer: Self-pay | Admitting: Internal Medicine

## 2019-07-14 DIAGNOSIS — R05 Cough: Secondary | ICD-10-CM

## 2019-07-14 DIAGNOSIS — R059 Cough, unspecified: Secondary | ICD-10-CM

## 2019-07-14 LAB — PULMONARY FUNCTION TEST

## 2019-07-14 MED ORDER — HYDROCOD POLST-CPM POLST ER 10-8 MG/5ML PO SUER: ORAL | 0 refills | Status: DC

## 2019-07-14 NOTE — Telephone Encounter (Signed)
Pt was notified that his rx was sent in for tussionex

## 2019-07-14 NOTE — Progress Notes (Signed)
Refilled small amount of tusinox for patient.

## 2019-12-28 ENCOUNTER — Telehealth: Payer: Self-pay

## 2019-12-28 NOTE — Telephone Encounter (Signed)
Called lmom informing patient of appointment on 01/01/2020. klh

## 2020-01-01 ENCOUNTER — Other Ambulatory Visit: Payer: Self-pay

## 2020-01-01 ENCOUNTER — Encounter: Payer: Self-pay | Admitting: Internal Medicine

## 2020-01-01 ENCOUNTER — Ambulatory Visit: Payer: Medicare Other | Admitting: Internal Medicine

## 2020-01-01 VITALS — BP 160/90 | HR 59 | Temp 95.7°F | Resp 16 | Ht 74.0 in | Wt 277.0 lb

## 2020-01-01 DIAGNOSIS — R05 Cough: Secondary | ICD-10-CM

## 2020-01-01 DIAGNOSIS — R0602 Shortness of breath: Secondary | ICD-10-CM | POA: Diagnosis not present

## 2020-01-01 DIAGNOSIS — R059 Cough, unspecified: Secondary | ICD-10-CM

## 2020-01-01 DIAGNOSIS — K219 Gastro-esophageal reflux disease without esophagitis: Secondary | ICD-10-CM

## 2020-01-01 DIAGNOSIS — J449 Chronic obstructive pulmonary disease, unspecified: Secondary | ICD-10-CM

## 2020-01-01 MED ORDER — HYDROCOD POLST-CPM POLST ER 10-8 MG/5ML PO SUER: ORAL | 0 refills | Status: DC

## 2020-01-01 NOTE — Progress Notes (Signed)
[facility] [place], [place] [address]  Pulmonary Sleep Medicine   Office Visit Note  Patient Name: Carlos Romero DOB: October 14, 1945 MRN [id number]  Date of Service: 01/01/2020  Complaints/HPI: Pt is here for pulmonary follow up.  He reports some increased SOB over the last month or so.  He reports he gets SOB with minimal exertion.  He is no longer using trelegy because he does not feel like it helped. He reports the sob will resolve in a few minutes of rest.  He denies any other new symptoms at this time.  He continues to have intermittent coughing for which he uses tussionex cough syrup.   ROS  General: (-) fever, (-) chills, (-) night sweats, (-) weakness Skin: (-) rashes, (-) itching,. Eyes: (-) visual changes, (-) redness, (-) itching. Nose and Sinuses: (-) nasal stuffiness or itchiness, (-) postnasal drip, (-) nosebleeds, (-) sinus trouble. Mouth and Throat: (-) sore throat, (-) hoarseness. Neck: (-) swollen glands, (-) enlarged thyroid, (-) neck pain. Respiratory: + cough, (-) bloody sputum, + shortness of breath, - wheezing. Cardiovascular: - ankle swelling, (-) chest pain. Lymphatic: (-) lymph node enlargement. Neurologic: (-) numbness, (-) tingling. Psychiatric: (-) anxiety, (-) depression   Current Medication: Outpatient Encounter Medications as of 01/01/2020  Medication Sig Note  . albuterol (VENTOLIN HFA) 108 (90 Base) MCG/ACT inhaler Inhale 2 puffs into the lungs every 6 (six) hours as needed for wheezing or shortness of breath.   Marland Kitchen azelastine (ASTELIN) 0.1 % nasal spray 1 spray 2 (two) times daily.   Marland Kitchen b complex vitamins tablet Take 1 tablet by mouth daily.   . chlorpheniramine (CHLOR-TRIMETON) 4 MG tablet Take 4 mg by mouth every 4 (four) hours as needed for allergies.   . chlorpheniramine-HYDROcodone (TUSSIONEX) 10-8 MG/5ML SUER TAKE 5 MILLILITERS BY MOUTH TWICE A DAY AS NEEDED   . diphenhydrAMINE (BENADRYL) 25 MG tablet Take 25 mg by  mouth at bedtime as needed for allergies.   . Fluticasone-Umeclidin-Vilant (TRELEGY ELLIPTA) 100-62.5-25 MCG/INH AEPB Inhale 1 puff into the lungs daily.   Marland Kitchen ipratropium (ATROVENT) 0.06 % nasal spray Place 2 sprays into both nostrils 4 (four) times daily.   Marland Kitchen loperamide (IMODIUM A-D) 2 MG tablet Take 2 mg by mouth 4 (four) times daily as needed for diarrhea or loose stools.   Marland Kitchen loratadine (CLARITIN) 10 MG tablet Take 10 mg by mouth daily.   . mometasone (NASONEX) 50 MCG/ACT nasal spray Place 2 sprays into the nose daily.   . Multiple Vitamins-Minerals (VITAMINS TO GO MEN PO) Take by mouth.   Marland Kitchen omeprazole (PRILOSEC) 20 MG capsule Take 20 mg by mouth daily as needed.    . pantoprazole (PROTONIX) 40 MG tablet TAKE ONE TABLET BY MOUTH ONCE DAILY 30 TO 60 MINUTES BEFORE FIRST MEAL OF THE DAY   . valACYclovir (VALTREX) 1000 MG tablet as directed.  12/05/2013: Received from: External Pharmacy  . [DISCONTINUED] chlorpheniramine-HYDROcodone (TUSSIONEX) 10-8 MG/5ML SUER TAKE 5 MILLILITERS BY MOUTH TWICE A DAY AS NEEDED   . predniSONE (DELTASONE) 20 MG tablet Take 20 mg by mouth 2 (two) times daily.   . [DISCONTINUED] predniSONE (DELTASONE) 10 MG tablet Take 4 tabs for 2 days, then 3 tabs for 2 days, then 2 tabs for 2 days, then 1 tab for 2 days, then stop (Patient not taking: Reported on 01/01/2020)    No facility-administered encounter medications on file as of 01/01/2020.    Surgical History: Past Surgical History:  Procedure Laterality Date  .  PROSTATECTOMY  1999    Medical History: Past Medical History:  Diagnosis Date  . Barrett's esophagus   . Chronic cough    onset Oct 2010, better after prednisone short course 12-2009, sinus CT 11-06-10 bilateral maxillary, ethmoid and possibly frontal sinusitis>21 days augmentin  . COPD (chronic obstructive pulmonary disease) (HCC)    gold stage 2, PFTs 06-12-09 FEV1 2.25 (65%) ratio 62 and no better with B2  . Depression 1999  . GERD (gastroesophageal  reflux disease)   . Hyperlipidemia   . Lung disease    LLL air space disease  . Prostate cancer ([place]) 2003  . PUD (peptic ulcer disease) 1967   s/p perf  . Pulmonary embolism ([place]) 1967  . Solitary lung nodule    RML    Family History: Family History  Problem Relation Age of Onset  . Cancer Mother        bladder  . Stroke Mother   . Asthma Mother   . Emphysema Other        "everyone"    Social History: Social History   Socioeconomic History  . Marital status: Married    Spouse name: Not on file  . Number of children: Not on file  . Years of education: Not on file  . Highest education level: Not on file  Occupational History  . Occupation: Retail buyer: Retired  Tobacco Use  . Smoking status: Former Smoker    Packs/day: 1.00    Years: 20.00    Pack years: 20.00    Types: Cigarettes    Quit date: 08/24/1986    Years since quitting: 33.3  . Smokeless tobacco: Never Used  Substance and Sexual Activity  . Alcohol use: Yes    Alcohol/week: 0.0 standard drinks    Comment: beer once in a while   . Drug use: Never  . Sexual activity: Not on file  Other Topics Concern  . Not on file  Social History Narrative  . Not on file   Social Determinants of Health   Financial Resource Strain:   . Difficulty of Paying Living Expenses:   Food Insecurity:   . Worried About Charity fundraiser in the Last Year:   . Arboriculturist in the Last Year:   Transportation Needs:   . Film/video editor (Medical):   Marland Kitchen Lack of Transportation (Non-Medical):   Physical Activity:   . Days of Exercise per Week:   . Minutes of Exercise per Session:   Stress:   . Feeling of Stress :   Social Connections:   . Frequency of Communication with Friends and Family:   . Frequency of Social Gatherings with Friends and Family:   . Attends Religious Services:   . Active Member of Clubs or Organizations:   . Attends Archivist Meetings:   Marland Kitchen Marital Status:   Intimate  Partner Violence:   . Fear of Current or Ex-Partner:   . Emotionally Abused:   Marland Kitchen Physically Abused:   . Sexually Abused:     Vital Signs: Blood pressure (!) 193/98, pulse (!) 59, temperature (!) 95.7 F (35.4 C), resp. rate 16, height 6\' 2"  (1.88 m), weight 277 lb (125.6 kg), SpO2 96 %.  Examination: General Appearance: The patient is well-developed, well-nourished, and in no distress. Skin: Gross inspection of skin unremarkable. Head: normocephalic, no gross deformities. Eyes: no gross deformities noted. ENT: ears appear grossly normal no exudates. Neck: Supple. No thyromegaly. No LAD. Respiratory:  clear bilaterally. Cardiovascular: Normal S1 and S2 without murmur or rub. Extremities: No cyanosis. pulses are equal. Neurologic: Alert and oriented. No involuntary movements.  LABS: No results found for this or any previous visit (from the past 2160 hour(s)).  Radiology: CT Chest Wo Contrast  Result Date: 07/06/2018 CLINICAL DATA:  Evaluate for pulmonary nodule. Former smoker. Shortness of breath and cough. EXAM: CT CHEST WITHOUT CONTRAST TECHNIQUE: Multidetector CT imaging of the chest was performed following the standard protocol without IV contrast. COMPARISON:  Chest x-ray April 27, 2018 and CT scan November 06, 2010 FINDINGS: Cardiovascular: Coronary artery calcifications in the right coronary artery. The thoracic aorta is nonaneurysmal. The main pulmonary artery is normal in caliber. The heart is unremarkable. Mediastinum/Nodes: Small nodules in the left thyroid lobe measure less than 10 mm, requiring no follow-up. The esophagus is normal. No adenopathy in the chest. No effusions. Lungs/Pleura: Central airways are normal. Minimal emphysematous changes in the apices. Minimal thickening along the minor fissure on series 3, image 83 is unchanged given difference in slice selection since 2012, of no significance. No suspicious pulmonary nodules, masses, or infiltrates. Upper Abdomen: No  acute abnormality. Musculoskeletal: No chest wall mass or suspicious bone lesions identified. IMPRESSION: 1. No suspicious pulmonary nodules. 2. No other significant abnormalities. Electronically Signed   By: Dorise Bullion III M.D   On: 07/06/2018 01:17    No results found.  No results found.    Assessment and Plan: Patient Active Problem List   Diagnosis Date Noted  . Pneumonia 12/23/2017  . Obesity (BMI 30-39.9) 04/16/2015  . Pain in lower limb 12/05/2013  . Onychomycosis 08/29/2013  . Pain in toe 08/29/2013  . Plantar fasciitis of left foot 03/27/2013  . Equinus deformity of foot, acquired 03/27/2013  . Acute sinusitis 11/07/2010  . PULMONARY NODULE 01/08/2010  . PROSTATE CANCER 12/12/2009  . HYPERCHOLESTEROLEMIA 12/12/2009  . DEPRESSION 12/12/2009  . PULMONARY EMBOLISM 12/12/2009  . COPD GOLD II  12/12/2009  . GERD 12/12/2009  . BARRETTS ESOPHAGUS 12/12/2009  . Cough 12/12/2009  . PUD, HX OF 12/12/2009    1. Obstructive chronic bronchitis without exacerbation ([place]) Samples of Breztri given for trial.  Patient will call if he would like to get RX.  Patient no taking any medicaitons for copd at this time.     2. Gastroesophageal reflux disease without esophagitis Stable, continue Protonix.   3. Shortness of breath Will get Echo for increased dyspnea.  - Spirometry with Graph - ECHOCARDIOGRAM COMPLETE; Future  4. Cough Reviewed risks and possible side effects associated with taking opiates, benzodiazepines and other CNS depressants. Combination of these could cause dizziness and drowsiness. Advised patient not to drive or operate machinery when taking these medications, as patient's and other's life can be at risk and will have consequences. Patient verbalized understanding in this matter. Dependence and abuse for these drugs will be monitored closely. A Controlled substance policy and procedure is on file which allows [facility] to order a urine drug  screen test at any visit. Patient understands and agrees with the plan - chlorpheniramine-HYDROcodone (TUSSIONEX) 10-8 MG/5ML SUER; TAKE 5 MILLILITERS BY MOUTH TWICE A DAY AS NEEDED  Dispense: 230 mL; Refill: 0  General Counseling: I have discussed the findings of the evaluation and examination with Gwyndolyn Saxon.  I have also discussed any further diagnostic evaluation thatmay be needed or ordered today. Donaldo verbalizes understanding of the findings of todays visit. We also reviewed his medications today and discussed drug interactions and  side effects including but not limited excessive drowsiness and altered mental states. We also discussed that there is always a risk not just to him but also people around him. he has been encouraged to call the office with any questions or concerns that should arise related to todays visit.  Orders Placed This Encounter  Procedures  . ECHOCARDIOGRAM COMPLETE    Standing Status:   Future    Standing Expiration Date:   04/02/2021    Order Specific Question:   Where should this test be performed    Answer:   External    Order Specific Question:   Perflutren DEFINITY (image enhancing agent) should be administered unless hypersensitivity or allergy exist    Answer:   Do NOT administer Perflutren    Order Specific Question:   Reason for no Perflutren    Answer:   Other    Order Specific Question:   Specify other    Answer:   na    Order Specific Question:   Reason for exam-Echo    Answer:   Dyspnea  786.09 / R06.00  . Spirometry with Graph    Order Specific Question:   Where should this test be performed?    Answer:   [facility]     Time spent: 30 This patient was seen by Orson Gear AGNP-C in Collaboration with Dr. Devona Konig as a part of collaborative care agreement.   I have personally obtained a history, examined the patient, evaluated laboratory and imaging results, formulated the assessment and plan and placed orders.    Allyne Gee,  MD [facility] Pulmonary and Critical Care Sleep medicine

## 2020-01-26 ENCOUNTER — Other Ambulatory Visit: Payer: Self-pay

## 2020-01-26 ENCOUNTER — Ambulatory Visit: Payer: Medicare Other

## 2020-01-26 ENCOUNTER — Telehealth: Payer: Self-pay

## 2020-01-26 DIAGNOSIS — R0602 Shortness of breath: Secondary | ICD-10-CM | POA: Diagnosis not present

## 2020-01-26 NOTE — Telephone Encounter (Signed)
Pt was here today for U/S he said his lips are dry advised to make sure was his mouth nicely after using inhaler also he can use Vaseline and if not going away we can move his appt earlier then 6/14

## 2020-02-01 ENCOUNTER — Telehealth: Payer: Self-pay

## 2020-02-01 NOTE — Telephone Encounter (Signed)
Called lmom informing patient of appointment on 02/05/2020. klh

## 2020-02-05 ENCOUNTER — Encounter: Payer: Self-pay | Admitting: Internal Medicine

## 2020-02-05 ENCOUNTER — Ambulatory Visit: Payer: Medicare Other | Admitting: Internal Medicine

## 2020-02-05 ENCOUNTER — Other Ambulatory Visit: Payer: Self-pay

## 2020-02-05 VITALS — BP 140/79 | HR 60 | Temp 98.0°F | Resp 16 | Ht 74.0 in | Wt 277.0 lb

## 2020-02-05 DIAGNOSIS — I5189 Other ill-defined heart diseases: Secondary | ICD-10-CM | POA: Diagnosis not present

## 2020-02-05 DIAGNOSIS — I517 Cardiomegaly: Secondary | ICD-10-CM

## 2020-02-05 DIAGNOSIS — J449 Chronic obstructive pulmonary disease, unspecified: Secondary | ICD-10-CM

## 2020-02-05 DIAGNOSIS — R0683 Snoring: Secondary | ICD-10-CM

## 2020-02-05 MED ORDER — BREZTRI AEROSPHERE 160-9-4.8 MCG/ACT IN AERO: 2.0000 | INHALATION_SPRAY | Freq: Two times a day (BID) | RESPIRATORY_TRACT | 3 refills | Status: DC

## 2020-02-05 MED ORDER — LOSARTAN POTASSIUM 25 MG PO TABS: 25.0000 mg | ORAL_TABLET | Freq: Every day | ORAL | 2 refills | Status: DC

## 2020-02-05 NOTE — Progress Notes (Signed)
[facility], [place] [address]  Pulmonary Sleep Medicine   Office Visit Note  Patient Name: Carlos Romero DOB: 1946/03/03 MRN [id number]  Date of Service: 02/05/2020  Complaints/HPI:  Pt is here to review echo results.  His Echo shows diastolic dysfunction as well as, bilateral atrial enlargement.  He continues to report some sob, especially with exertion.    ROS  General: (-) fever, (-) chills, (-) night sweats, (-) weakness Skin: (-) rashes, (-) itching,. Eyes: (-) visual changes, (-) redness, (-) itching. Nose and Sinuses: (-) nasal stuffiness or itchiness, (-) postnasal drip, (-) nosebleeds, (-) sinus trouble. Mouth and Throat: (-) sore throat, (-) hoarseness. Neck: (-) swollen glands, (-) enlarged thyroid, (-) neck pain. Respiratory: - cough, (-) bloody sputum, + shortness of breath, - wheezing. Cardiovascular: - ankle swelling, (-) chest pain. Lymphatic: (-) lymph node enlargement. Neurologic: (-) numbness, (-) tingling. Psychiatric: (-) anxiety, (-) depression   Current Medication: Outpatient Encounter Medications as of 02/05/2020  Medication Sig Note  . albuterol (VENTOLIN HFA) 108 (90 Base) MCG/ACT inhaler Inhale 2 puffs into the lungs every 6 (six) hours as needed for wheezing or shortness of breath.   Marland Kitchen azelastine (ASTELIN) 0.1 % nasal spray 1 spray 2 (two) times daily.   Marland Kitchen b complex vitamins tablet Take 1 tablet by mouth daily.   . chlorpheniramine (CHLOR-TRIMETON) 4 MG tablet Take 4 mg by mouth every 4 (four) hours as needed for allergies.   . chlorpheniramine-HYDROcodone (TUSSIONEX) 10-8 MG/5ML SUER TAKE 5 MILLILITERS BY MOUTH TWICE A DAY AS NEEDED   . diphenhydrAMINE (BENADRYL) 25 MG tablet Take 25 mg by mouth at bedtime as needed for allergies.   Marland Kitchen ipratropium (ATROVENT) 0.06 % nasal spray Place 2 sprays into both nostrils 4 (four) times daily.   Marland Kitchen loperamide (IMODIUM A-D) 2 MG tablet Take 2 mg by mouth 4 (four) times daily  as needed for diarrhea or loose stools.   Marland Kitchen loratadine (CLARITIN) 10 MG tablet Take 10 mg by mouth daily.   . mometasone (NASONEX) 50 MCG/ACT nasal spray Place 2 sprays into the nose daily.   . Multiple Vitamins-Minerals (VITAMINS TO GO MEN PO) Take by mouth.   Marland Kitchen omeprazole (PRILOSEC) 20 MG capsule Take 20 mg by mouth daily as needed.    . pantoprazole (PROTONIX) 40 MG tablet TAKE ONE TABLET BY MOUTH ONCE DAILY 30 TO 60 MINUTES BEFORE FIRST MEAL OF THE DAY   . predniSONE (DELTASONE) 20 MG tablet Take 20 mg by mouth 2 (two) times daily.   . valACYclovir (VALTREX) 1000 MG tablet as directed.  12/05/2013: Received from: External Pharmacy   No facility-administered encounter medications on file as of 02/05/2020.    Surgical History: Past Surgical History:  Procedure Laterality Date  . PROSTATECTOMY  1999    Medical History: Past Medical History:  Diagnosis Date  . Barrett's esophagus   . Chronic cough    onset Oct 2010, better after prednisone short course 12-2009, sinus CT 11-06-10 bilateral maxillary, ethmoid and possibly frontal sinusitis>21 days augmentin  . COPD (chronic obstructive pulmonary disease) (HCC)    gold stage 2, PFTs 06-12-09 FEV1 2.25 (65%) ratio 62 and no better with B2  . Depression 1999  . GERD (gastroesophageal reflux disease)   . Hyperlipidemia   . Lung disease    LLL air space disease  . Prostate cancer ([place]) 2003  . PUD (peptic ulcer disease) 1967   s/p perf  . Pulmonary embolism ([place]) 1967  .  Solitary lung nodule    RML    Family History: Family History  Problem Relation Age of Onset  . Cancer Mother        bladder  . Stroke Mother   . Asthma Mother   . Emphysema Other        "everyone"    Social History: Social History   Socioeconomic History  . Marital status: Married    Spouse name: Not on file  . Number of children: Not on file  . Years of education: Not on file  . Highest education level: Not on file  Occupational History  .  Occupation: Retail buyer: Retired  Tobacco Use  . Smoking status: Former Smoker    Packs/day: 1.00    Years: 20.00    Pack years: 20.00    Types: Cigarettes    Quit date: 08/24/1986    Years since quitting: 33.4  . Smokeless tobacco: Never Used  Vaping Use  . Vaping Use: Never used  Substance and Sexual Activity  . Alcohol use: Yes    Alcohol/week: 0.0 standard drinks    Comment: beer once in a while   . Drug use: Never  . Sexual activity: Not on file  Other Topics Concern  . Not on file  Social History Narrative  . Not on file   Social Determinants of Health   Financial Resource Strain:   . Difficulty of Paying Living Expenses:   Food Insecurity:   . Worried About Charity fundraiser in the Last Year:   . Arboriculturist in the Last Year:   Transportation Needs:   . Film/video editor (Medical):   Marland Kitchen Lack of Transportation (Non-Medical):   Physical Activity:   . Days of Exercise per Week:   . Minutes of Exercise per Session:   Stress:   . Feeling of Stress :   Social Connections:   . Frequency of Communication with Friends and Family:   . Frequency of Social Gatherings with Friends and Family:   . Attends Religious Services:   . Active Member of Clubs or Organizations:   . Attends Archivist Meetings:   Marland Kitchen Marital Status:   Intimate Partner Violence:   . Fear of Current or Ex-Partner:   . Emotionally Abused:   Marland Kitchen Physically Abused:   . Sexually Abused:     Vital Signs: Blood pressure 140/79, pulse 60, temperature 98 F (36.7 C), resp. rate 16, height 6\' 2"  (1.88 m), weight 277 lb (125.6 kg), SpO2 96 %.  Examination: General Appearance: The patient is well-developed, well-nourished, and in no distress. Skin: Gross inspection of skin unremarkable. Head: normocephalic, no gross deformities. Eyes: no gross deformities noted. ENT: ears appear grossly normal no exudates. Neck: Supple. No thyromegaly. No LAD. Respiratory: clear  bilaterally. Cardiovascular: Normal S1 and S2 without murmur or rub. Extremities: No cyanosis. pulses are equal. Neurologic: Alert and oriented. No involuntary movements.  LABS: No results found for this or any previous visit (from the past 2160 hour(s)).  Radiology: CT Chest Wo Contrast  Result Date: 07/06/2018 CLINICAL DATA:  Evaluate for pulmonary nodule. Former smoker. Shortness of breath and cough. EXAM: CT CHEST WITHOUT CONTRAST TECHNIQUE: Multidetector CT imaging of the chest was performed following the standard protocol without IV contrast. COMPARISON:  Chest x-ray April 27, 2018 and CT scan November 06, 2010 FINDINGS: Cardiovascular: Coronary artery calcifications in the right coronary artery. The thoracic aorta is nonaneurysmal. The main pulmonary artery is  normal in caliber. The heart is unremarkable. Mediastinum/Nodes: Small nodules in the left thyroid lobe measure less than 10 mm, requiring no follow-up. The esophagus is normal. No adenopathy in the chest. No effusions. Lungs/Pleura: Central airways are normal. Minimal emphysematous changes in the apices. Minimal thickening along the minor fissure on series 3, image 83 is unchanged given difference in slice selection since 2012, of no significance. No suspicious pulmonary nodules, masses, or infiltrates. Upper Abdomen: No acute abnormality. Musculoskeletal: No chest wall mass or suspicious bone lesions identified. IMPRESSION: 1. No suspicious pulmonary nodules. 2. No other significant abnormalities. Electronically Signed   By: Dorise Bullion III M.D   On: 07/06/2018 01:17    No results found.  No results found.    Assessment and Plan: Patient Active Problem List   Diagnosis Date Noted  . Pneumonia 12/23/2017  . Obesity (BMI 30-39.9) 04/16/2015  . Pain in lower limb 12/05/2013  . Onychomycosis 08/29/2013  . Pain in toe 08/29/2013  . Plantar fasciitis of left foot 03/27/2013  . Equinus deformity of foot, acquired 03/27/2013   . Acute sinusitis 11/07/2010  . PULMONARY NODULE 01/08/2010  . PROSTATE CANCER 12/12/2009  . HYPERCHOLESTEROLEMIA 12/12/2009  . DEPRESSION 12/12/2009  . PULMONARY EMBOLISM 12/12/2009  . COPD GOLD II  12/12/2009  . GERD 12/12/2009  . BARRETTS ESOPHAGUS 12/12/2009  . Cough 12/12/2009  . PUD, HX OF 12/12/2009    1. Obstructive chronic bronchitis without exacerbation ([place]) Controlled using Breztri as directed.   2. Loud snoring Pt should have sleep study to assess for OSA.  - Home sleep test  3. Diastolic dysfunction Start low dose losartan, and follow up with pcp.  He will likely need a Cardiology referral to establish care.  He would benefit from right and left Cath to evaluate pressures.  He will discuss with PCP and we can refer or Dr. Marisue Humble can.   - losartan (COZAAR) 25 MG tablet; Take 1 tablet (25 mg total) by mouth daily.  Dispense: 30 tablet; Refill: 2  4. Atrial enlargement, bilateral - losartan (COZAAR) 25 MG tablet; Take 1 tablet (25 mg total) by mouth daily.  Dispense: 30 tablet; Refill: 2  General Counseling: I have discussed the findings of the evaluation and examination with Gwyndolyn Saxon.  I have also discussed any further diagnostic evaluation thatmay be needed or ordered today. Fayez verbalizes understanding of the findings of todays visit. We also reviewed his medications today and discussed drug interactions and side effects including but not limited excessive drowsiness and altered mental states. We also discussed that there is always a risk not just to him but also people around him. he has been encouraged to call the office with any questions or concerns that should arise related to todays visit.  No orders of the defined types were placed in this encounter.    Time spent: 30  I have personally obtained a history, examined the patient, evaluated laboratory and imaging results, formulated the assessment and plan and placed orders.    Allyne Gee, MD  [facility] Pulmonary and Critical Care Sleep medicine

## 2020-02-27 ENCOUNTER — Telehealth: Payer: Self-pay

## 2020-02-27 NOTE — Telephone Encounter (Signed)
As per adam pt wife advised pt need to follow up PCP regarding BP med and also need referral for cardiologist as well

## 2020-02-27 NOTE — Telephone Encounter (Signed)
Per patient does not want to schedule home sleep study at moment, I will continue to save his information. Beth

## 2020-04-02 NOTE — Progress Notes (Signed)
Cardiology Office Note:   Date:  04/05/2020  NAME:  Carlos Romero    MRN: [id number] DOB:  July 13, 1946   PCP:  Gaynelle Arabian, MD  Cardiologist:  No primary care provider on file.   Referring MD: Gaynelle Arabian, MD   Chief Complaint  Patient presents with  . Shortness of Breath   History of Present Illness:   Carlos Romero is a 74 y.o. male with a hx of COPD who is being seen today for the evaluation of SOB at the request of Gaynelle Arabian, MD.  He presents for evaluation of shortness of breath.  He has been following with pulmonologist in [facility].  Apparently he had an echocardiogram and reported to have grade 1 diastolic dysfunction.  I can only see the report of his echocardiogram and I am unable to see the images.  He reports he gets short of breath when he does heavy exertion.  He reports he has not been that active over the past few years.  Symptoms occur daily.  He reports he started an exercise regimen with his wife.  He is walking up to 3 miles a day and his shortness of breath is improving minimally.  He reports he has been prescribed inhalers with minimal improvement in his shortness of breath as well.  He reports he does not get any chest pain when he exerts himself.  Is mainly a symptom of shortness of breath.  His EKG today demonstrates normal sinus rhythm with no acute ST-T change or evidence of prior infarction.  CVD risk factors include hyperlipidemia.  Most recent lipid profile shows a total cholesterol 200, HDL 52, LDL 97, triglycerides 259.  He is also obese with a BMI of 36.  He is undergoing a sleep study per his pulmonologist.  He apparently was put on losartan due to concerns for diastolic heart failure.  He has no evidence of lower extremity edema.  He reports no swelling in his legs.  There is no strong family history of heart disease.  He does have a 20-pack-year history of cigarettes.  Problem List 1. Moderate COPD -20-25-pack-year  history 2.  Hyperlipidemia -Total cholesterol 200, HDL 52, LDL 97, triglycerides 259  Past Medical History: Past Medical History:  Diagnosis Date  . Barrett's esophagus   . Chronic cough    onset Oct 2010, better after prednisone short course 12-2009, sinus CT 11-06-10 bilateral maxillary, ethmoid and possibly frontal sinusitis>21 days augmentin  . COPD (chronic obstructive pulmonary disease) (HCC)    gold stage 2, PFTs 06-12-09 FEV1 2.25 (65%) ratio 62 and no better with B2  . Depression 1999  . GERD (gastroesophageal reflux disease)   . Hyperlipidemia   . Lung disease    LLL air space disease  . Prostate cancer ([place]) 2003  . PUD (peptic ulcer disease) 1967   s/p perf  . Pulmonary embolism ([place]) 1967  . Solitary lung nodule    RML    Past Surgical History: Past Surgical History:  Procedure Laterality Date  . PROSTATECTOMY  1999    Current Medications: Current Meds  Medication Sig  . albuterol (VENTOLIN HFA) 108 (90 Base) MCG/ACT inhaler Inhale 2 puffs into the lungs every 6 (six) hours as needed for wheezing or shortness of breath.  Marland Kitchen azelastine (ASTELIN) 0.1 % nasal spray 1 spray 2 (two) times daily.  Marland Kitchen b complex vitamins tablet Take 1 tablet by mouth daily.  . chlorpheniramine (CHLOR-TRIMETON) 4 MG tablet Take 4 mg by  mouth every 4 (four) hours as needed for allergies.  . chlorpheniramine-HYDROcodone (TUSSIONEX) 10-8 MG/5ML SUER TAKE 5 MILLILITERS BY MOUTH TWICE A DAY AS NEEDED  . diphenhydrAMINE (BENADRYL) 25 MG tablet Take 25 mg by mouth at bedtime as needed for allergies.  Marland Kitchen loperamide (IMODIUM A-D) 2 MG tablet Take 2 mg by mouth 4 (four) times daily as needed for diarrhea or loose stools.  Marland Kitchen loratadine (CLARITIN) 10 MG tablet Take 10 mg by mouth daily.  Marland Kitchen losartan (COZAAR) 25 MG tablet Take 1 tablet (25 mg total) by mouth daily.  . Multiple Vitamins-Minerals (VITAMINS TO GO MEN PO) Take by mouth.  Marland Kitchen omeprazole (PRILOSEC) 20 MG capsule Take 20 mg by mouth daily as  needed.   . pantoprazole (PROTONIX) 40 MG tablet TAKE ONE TABLET BY MOUTH ONCE DAILY 30 TO 60 MINUTES BEFORE FIRST MEAL OF THE DAY  . valACYclovir (VALTREX) 1000 MG tablet as directed.   . [DISCONTINUED] Budeson-Glycopyrrol-Formoterol (BREZTRI AEROSPHERE) 160-9-4.8 MCG/ACT AERO Inhale 2 puffs into the lungs in the morning and at bedtime.  . [DISCONTINUED] ipratropium (ATROVENT) 0.06 % nasal spray Place 2 sprays into both nostrils 4 (four) times daily.  . [DISCONTINUED] mometasone (NASONEX) 50 MCG/ACT nasal spray Place 2 sprays into the nose daily.  . [DISCONTINUED] predniSONE (DELTASONE) 20 MG tablet Take 20 mg by mouth 2 (two) times daily.     Allergies:    Fluoxetine, Morphine, Morphine sulfate, and Prozac [fluoxetine hcl]   Social History: Social History   Socioeconomic History  . Marital status: Married    Spouse name: Not on file  . Number of children: Not on file  . Years of education: Not on file  . Highest education level: Not on file  Occupational History  . Occupation: Retail buyer: Retired  Tobacco Use  . Smoking status: Former Smoker    Packs/day: 1.00    Years: 25.00    Pack years: 25.00    Types: Cigarettes    Quit date: 08/24/1986    Years since quitting: 33.6  . Smokeless tobacco: Never Used  Vaping Use  . Vaping Use: Never used  Substance and Sexual Activity  . Alcohol use: Yes    Alcohol/week: 0.0 standard drinks    Comment: beer once in a while   . Drug use: Never  . Sexual activity: Not on file  Other Topics Concern  . Not on file  Social History Narrative  . Not on file   Social Determinants of Health   Financial Resource Strain:   . Difficulty of Paying Living Expenses:   Food Insecurity:   . Worried About Charity fundraiser in the Last Year:   . Arboriculturist in the Last Year:   Transportation Needs:   . Film/video editor (Medical):   Marland Kitchen Lack of Transportation (Non-Medical):   Physical Activity:   . Days of Exercise per  Week:   . Minutes of Exercise per Session:   Stress:   . Feeling of Stress :   Social Connections:   . Frequency of Communication with Friends and Family:   . Frequency of Social Gatherings with Friends and Family:   . Attends Religious Services:   . Active Member of Clubs or Organizations:   . Attends Archivist Meetings:   Marland Kitchen Marital Status:      Family History: The patient's family history includes Asthma in his mother; Cancer in his mother; Emphysema in an other family member; Stroke in  his mother.  ROS:   All other ROS reviewed and negative. Pertinent positives noted in the HPI.     EKGs/Labs/Other Studies Reviewed:   The following studies were personally reviewed by me today:  EKG:  EKG is ordered today.  The ekg ordered today demonstrates normal sinus rhythm, heart 68, no acute ST-T changes, and was personally reviewed by me.   Recent Labs: No results found for requested labs within last 8760 hours.   Recent Lipid Panel No results found for: CHOL, TRIG, HDL, CHOLHDL, VLDL, LDLCALC, LDLDIRECT  Physical Exam:   VS:  BP 128/90 (BP Location: Left Arm, Patient Position: Sitting, Cuff Size: Large)   Pulse 68   Ht 6\' 2"  (1.88 m)   Wt 284 lb (128.8 kg)   SpO2 92%   BMI 36.46 kg/m    Wt Readings from Last 3 Encounters:  04/05/20 284 lb (128.8 kg)  02/05/20 277 lb (125.6 kg)  01/01/20 277 lb (125.6 kg)    General: Well nourished, well developed, in no acute distress Heart: Atraumatic, normal size  Eyes: PEERLA, EOMI  Neck: Supple, no JVD Endocrine: No thryomegaly Cardiac: Normal S1, S2; RRR; no murmurs, rubs, or gallops Lungs: Clear to auscultation bilaterally, no wheezing, rhonchi or rales  Abd: Soft, nontender, no hepatomegaly  Ext: No edema, pulses 2+ Musculoskeletal: No deformities, BUE and BLE strength normal and equal Skin: Warm and dry, no rashes   Neuro: Alert and oriented to person, place, time, and situation, CNII-XII grossly intact, no focal  deficits  Psych: Normal mood and affect   ASSESSMENT:   Manoah Deckard is a 74 y.o. male who presents for the following: 1. SOB (shortness of breath) on exertion   2. Mixed hyperlipidemia     PLAN:   1. SOB (shortness of breath) on exertion -He presents with 6 months of worsening shortness of breath.  He has moderate COPD.  His EKG today demonstrates normal sinus rhythm with no acute ST-T changes.  He reports no symptoms concerning for exertional angina.  He is never had a heart attack or stroke.  He did have an echocardiogram done in [facility].  I can only see the report which shows normal ejection fraction and grade 1 diastolic function.  He was sent for evaluation of diastolic heart failure.  He has no evidence of volume overload on exam today.  He has no stigmata of heart failure that I can tell.  I would like to check a BNP today.  I would also like to recheck an echocardiogram so we can evaluate and look at the pictures directly.  Overall, I have low suspicion for congestive heart failure.  There was mention of having a heart catheterization done.  I think this is preemptive.  We need to do our own evaluation first.  I also recommended an exercise nuclear stress test.  He reports exertional shortness of breath and does have CVD risk factors include hyperlipidemia, COPD, morbid obesity.  I think we can exclude effective CAD with a stress test.  I also encouraged him to maintain a high level of activity.  He can continue his losartan for now.  We likely will stop this if his echocardiogram is unremarkable.  2. Mixed hyperlipidemia -I would like to recheck a fasting lipid profile.  Unclear if he was fasting in November.   Disposition: Return in about 2 months (around 06/05/2020).  Medication Adjustments/Labs and Tests Ordered: Current medicines are reviewed at length with the patient today.  Concerns regarding medicines are outlined above.  No orders of the defined types  were placed in this encounter.  No orders of the defined types were placed in this encounter.   Patient Instructions  Medication Instructions:  The current medical regimen is effective;  continue present plan and medications.  *If you need a refill on your cardiac medications before your next appointment, please call your pharmacy*   Lab Work: BNP, LIPID today   If you have labs (blood work) drawn today and your tests are completely normal, you will receive your results only by: Marland Kitchen MyChart Message (if you have MyChart) OR . A paper copy in the mail If you have any lab test that is abnormal or we need to change your treatment, we will call you to review the results.   Testing/Procedures: Echocardiogram - Your physician has requested that you have an echocardiogram. Echocardiography is a painless test that uses sound waves to create images of your heart. It provides your doctor with information about the size and shape of your heart and how well your heart's chambers and valves are working. This procedure takes approximately one hour. There are no restrictions for this procedure. This will be performed at our [facility] location - [address], [address].  Your physician has requested that you have an Exercise Myoview. A cardiac stress test is a cardiological test that measures the heart's ability to respond to external stress in a controlled clinical environment. The stress response is induced by exercise (exercise-treadmill). For further information please visit [email or web address]. If you have questions or concerns about your appointment, you can call the Nuclear Lab at [phone].  (COVID TESTING needed)    Follow-Up: At [facility], you and your health needs are our priority.  As part of our continuing mission to provide you with exceptional heart care, we have created designated Provider Care Teams.  These Care Teams include your primary Cardiologist (physician) and Advanced  Practice Providers (APPs -  Physician Assistants and Nurse Practitioners) who all work together to provide you with the care you need, when you need it.  We recommend signing up for the patient portal called "MyChart".  Sign up information is provided on this After Visit Summary.  MyChart is used to connect with patients for Virtual Visits (Telemedicine).  Patients are able to view lab/test results, encounter notes, upcoming appointments, etc.  Non-urgent messages can be sent to your provider as well.   To learn more about what you can do with MyChart, go to [email or web address].    Your next appointment:   2 month(s)  The format for your next appointment:   In Person  Provider:   Eleonore Chiquito, MD   Other Instructions Referral over to [place], they will contact you for an appointment.     Signed, Addison Naegeli. Audie Box, [place]  [address] [place], [place] [address] [phone]  04/05/2020 8:58 AM

## 2020-04-05 ENCOUNTER — Ambulatory Visit: Payer: Medicare Other | Admitting: Cardiovascular Disease

## 2020-04-05 ENCOUNTER — Other Ambulatory Visit (HOSPITAL_COMMUNITY)
Admission: RE | Admit: 2020-04-05 | Discharge: 2020-04-05 | Disposition: A | Discharge status: Home / Self Care | Payer: Medicare Other | Source: Ambulatory Visit | Attending: Cardiovascular Disease | Admitting: Cardiovascular Disease

## 2020-04-05 ENCOUNTER — Telehealth (HOSPITAL_COMMUNITY): Payer: Self-pay | Admitting: *Deleted

## 2020-04-05 ENCOUNTER — Encounter: Payer: Self-pay | Admitting: Cardiovascular Disease

## 2020-04-05 ENCOUNTER — Other Ambulatory Visit: Payer: Self-pay

## 2020-04-05 VITALS — BP 128/90 | HR 68 | Ht 74.0 in | Wt 284.0 lb

## 2020-04-05 DIAGNOSIS — R0602 Shortness of breath: Secondary | ICD-10-CM

## 2020-04-05 DIAGNOSIS — Z20822 Contact with and (suspected) exposure to covid-19: Secondary | ICD-10-CM | POA: Insufficient documentation

## 2020-04-05 DIAGNOSIS — Z01812 Encounter for preprocedural laboratory examination: Secondary | ICD-10-CM | POA: Diagnosis present

## 2020-04-05 DIAGNOSIS — E782 Mixed hyperlipidemia: Secondary | ICD-10-CM

## 2020-04-05 LAB — SARS CORONAVIRUS 2 (TAT 6-24 HRS): SARS Coronavirus 2: NEGATIVE

## 2020-04-05 NOTE — Telephone Encounter (Signed)
Close encounter 

## 2020-04-05 NOTE — Patient Instructions (Addendum)
Medication Instructions:  The current medical regimen is effective;  continue present plan and medications.  *If you need a refill on your cardiac medications before your next appointment, please call your pharmacy*   Lab Work: BNP, LIPID today   If you have labs (blood work) drawn today and your tests are completely normal, you will receive your results only by: Marland Kitchen MyChart Message (if you have MyChart) OR . A paper copy in the mail If you have any lab test that is abnormal or we need to change your treatment, we will call you to review the results.   Testing/Procedures: Echocardiogram - Your physician has requested that you have an echocardiogram. Echocardiography is a painless test that uses sound waves to create images of your heart. It provides your doctor with information about the size and shape of your heart and how well your heart's chambers and valves are working. This procedure takes approximately one hour. There are no restrictions for this procedure. This will be performed at our [facility] location - [address], [address].  Your physician has requested that you have an Exercise Myoview (complete at [address] location). A cardiac stress test is a cardiological test that measures the heart's ability to respond to external stress in a controlled clinical environment. The stress response is induced by exercise (exercise-treadmill). For further information please visit HugeFiesta.[email or web address]. If you have questions or concerns about your appointment, you can call the Nuclear Lab at [phone].  (COVID TESTING needed)    Follow-Up: At [facility], you and your health needs are our priority.  As part of our continuing mission to provide you with exceptional heart care, we have created designated Provider Care Teams.  These Care Teams include your primary Cardiologist (physician) and Advanced Practice Providers (APPs -  Physician Assistants and Nurse Practitioners) who all work  together to provide you with the care you need, when you need it.  We recommend signing up for the patient portal called "MyChart".  Sign up information is provided on this After Visit Summary.  MyChart is used to connect with patients for Virtual Visits (Telemedicine).  Patients are able to view lab/test results, encounter notes, upcoming appointments, etc.  Non-urgent messages can be sent to your provider as well.   To learn more about what you can do with MyChart, go to [email or web address].    Your next appointment:   2 month(s)  The format for your next appointment:   In Person  Provider:   Eleonore Chiquito, MD   Other Instructions Referral over to Pulmonology- Dr.Ellison, they will contact you for an appointment.

## 2020-04-06 LAB — LIPID PANEL
Chol/HDL Ratio: 3.9 ratio (ref 0.0–5.0)
Cholesterol, Total: 191 mg/dL (ref 100–199)
HDL: 49 mg/dL (ref >39)
LDL Chol Calc (NIH): 113 mg/dL — ABNORMAL HIGH (ref 0–99)
Triglycerides: 163 mg/dL — ABNORMAL HIGH (ref 0–149)
VLDL Cholesterol Cal: 29 mg/dL (ref 5–40)

## 2020-04-06 LAB — BRAIN NATRIURETIC PEPTIDE: BNP: 90.1 pg/mL (ref 0.0–100.0)

## 2020-04-09 ENCOUNTER — Ambulatory Visit (HOSPITAL_COMMUNITY)
Admission: RE | Admit: 2020-04-09 | Discharge: 2020-04-09 | Disposition: A | Discharge status: Home / Self Care | Payer: Medicare Other | Source: Ambulatory Visit | Attending: Cardiovascular Disease | Admitting: Cardiovascular Disease

## 2020-04-09 ENCOUNTER — Other Ambulatory Visit: Payer: Self-pay

## 2020-04-09 DIAGNOSIS — R0602 Shortness of breath: Secondary | ICD-10-CM | POA: Diagnosis present

## 2020-04-09 MED ORDER — TECHNETIUM TC 99M TETROFOSMIN IV KIT
32.2000 | PACK | Freq: Once | INTRAVENOUS | Status: AC | PRN
  Administered 2020-04-09: 32.2 via INTRAVENOUS
  Filled 2020-04-09: qty 33

## 2020-04-10 ENCOUNTER — Ambulatory Visit (HOSPITAL_COMMUNITY)
Admission: RE | Admit: 2020-04-10 | Discharge: 2020-04-10 | Disposition: A | Discharge status: Home / Self Care | Payer: Medicare Other | Source: Ambulatory Visit | Attending: Cardiology | Admitting: Cardiology

## 2020-04-10 LAB — MYOCARDIAL PERFUSION IMAGING
Estimated workload: 5.5 METS
Exercise duration (min): 5 min
Exercise duration (sec): 1 s
LV dias vol: 91 mL (ref 62–150)
LV sys vol: 47 mL
MPHR: 146 {beats}/min
Peak HR: 150 {beats}/min
Percent HR: 102 %
Rest HR: 77 {beats}/min
SDS: 1
SRS: 4
SSS: 5
TID: 0.92

## 2020-04-10 MED ORDER — TECHNETIUM TC 99M TETROFOSMIN IV KIT
32.0000 | PACK | Freq: Once | INTRAVENOUS | Status: AC | PRN
  Administered 2020-04-10: 32 via INTRAVENOUS

## 2020-04-22 ENCOUNTER — Other Ambulatory Visit: Payer: Self-pay

## 2020-04-22 ENCOUNTER — Ambulatory Visit (HOSPITAL_COMMUNITY): Payer: Medicare Other | Attending: Cardiology

## 2020-04-22 DIAGNOSIS — R0602 Shortness of breath: Secondary | ICD-10-CM | POA: Insufficient documentation

## 2020-04-22 LAB — ECHOCARDIOGRAM COMPLETE
Area-P 1/2: 2.17 cm2
S' Lateral: 3.6 cm

## 2020-05-06 ENCOUNTER — Ambulatory Visit: Payer: Medicare Other | Admitting: Internal Medicine

## 2020-05-19 ENCOUNTER — Other Ambulatory Visit: Payer: Self-pay | Admitting: Adult Health

## 2020-05-19 DIAGNOSIS — I517 Cardiomegaly: Secondary | ICD-10-CM

## 2020-05-19 DIAGNOSIS — I5189 Other ill-defined heart diseases: Secondary | ICD-10-CM

## 2020-05-24 ENCOUNTER — Encounter: Payer: Self-pay | Admitting: Pulmonary Disease

## 2020-05-24 ENCOUNTER — Telehealth: Payer: Self-pay | Admitting: Pulmonary Disease

## 2020-05-24 ENCOUNTER — Ambulatory Visit (INDEPENDENT_AMBULATORY_CARE_PROVIDER_SITE_OTHER): Payer: Medicare Other | Admitting: Pulmonary Disease

## 2020-05-24 ENCOUNTER — Other Ambulatory Visit: Payer: Self-pay

## 2020-05-24 VITALS — BP 110/68 | HR 63 | Temp 97.9°F | Ht 74.5 in | Wt 279.4 lb

## 2020-05-24 DIAGNOSIS — R059 Cough, unspecified: Secondary | ICD-10-CM | POA: Diagnosis not present

## 2020-05-24 DIAGNOSIS — J449 Chronic obstructive pulmonary disease, unspecified: Secondary | ICD-10-CM | POA: Diagnosis not present

## 2020-05-24 MED ORDER — BREZTRI AEROSPHERE 160-9-4.8 MCG/ACT IN AERO: 2.0000 | INHALATION_SPRAY | Freq: Two times a day (BID) | RESPIRATORY_TRACT | 6 refills | Status: DC

## 2020-05-24 MED ORDER — BREZTRI AEROSPHERE 160-9-4.8 MCG/ACT IN AERO: 2.0000 | INHALATION_SPRAY | Freq: Two times a day (BID) | RESPIRATORY_TRACT | 0 refills | Status: DC

## 2020-05-24 MED ORDER — HYDROCOD POLST-CPM POLST ER 10-8 MG/5ML PO SUER: ORAL | 0 refills | Status: DC

## 2020-05-24 NOTE — Progress Notes (Signed)
Subjective:   PATIENT ID: Carlos Romero GENDER: male DOB: 1946-03-13, MRN: [id number]   HPI  Chief Complaint  Patient presents with  . Consult    SHOB on exertion x 6 months to 1 year.     Reason for Visit: New consult for shortness of breath  [name] is a 74 year old male former smoker with COPD, hx childhood asthma, prostate cancer s/p prostatectomy in 1999 and radiation in 2002 and solitary kidney who presents for shortness of breath.  He previously was seen by a Pulmonologist in [place]. He was last seen by NP on 02/05/20. At the time he was on bronchodilators for COPD. Sleep study was ordered for snoring however patient declined testing. He was recently seen on 04/05/20 with Cardiology by Dr. Audie Box for shortness of breath. He had echocardiogram and stress test completed. Echo was noted to be technically difficult and demonstrated grade I diastolic dysfunction and impression of stress test showed mildly reduced LVEF.  He continues to have shortness of breath on exertion. He is able to walk 3 miles daily but notes that he is limited by his breathing. He has daily productive cough with clear sputum and wheezing that is worse at night. Recently this week, his cough has worsened. Cough syrup helps but he tries to use this sparingly. In May 2021, his PFTs demonstrated obstructive defect. He has been on Symbicort and Trelegy in the past without effect and recently on Breztri last month however unable to take due to cough with inhalation. Denies ED/urgent care visits related to his breathing.  Social History: Quit smoking 30 years ago.  Worked in Science writer exposures: None  I have personally reviewed patient's past medical/family/social history, allergies, current medications.  Past Medical History:  Diagnosis Date  . Barrett's esophagus   . Chronic cough    onset Oct 2010, better after prednisone short course 12-2009, sinus CT 11-06-10 bilateral  maxillary, ethmoid and possibly frontal sinusitis>21 days augmentin  . COPD (chronic obstructive pulmonary disease) (HCC)    gold stage 2, PFTs 06-12-09 FEV1 2.25 (65%) ratio 62 and no better with B2  . Depression 1999  . GERD (gastroesophageal reflux disease)   . Hyperlipidemia   . Lung disease    LLL air space disease  . Prostate cancer ([place]) 2003  . PUD (peptic ulcer disease) 1967   s/p perf  . Pulmonary embolism ([place]) 1967  . Solitary lung nodule    RML     Family History  Problem Relation Age of Onset  . Cancer Mother        bladder  . Stroke Mother   . Asthma Mother   . Emphysema Other        "everyone"     Social History   Occupational History  . Occupation: Retail buyer: Retired  Tobacco Use  . Smoking status: Former Smoker    Packs/day: 1.00    Years: 25.00    Pack years: 25.00    Types: Cigarettes    Quit date: 08/24/1986    Years since quitting: 33.7  . Smokeless tobacco: Never Used  Vaping Use  . Vaping Use: Never used  Substance and Sexual Activity  . Alcohol use: Yes    Alcohol/week: 0.0 standard drinks    Comment: beer once in a while   . Drug use: Never  . Sexual activity: Not on file    Allergies  Allergen Reactions  . Fluoxetine  Other (See Comments)    Vivid dreams  . Morphine     REACTION: "feels crazy"  . Morphine Sulfate Anxiety  . Prozac [Fluoxetine Hcl] Anxiety     Outpatient Medications Prior to Visit  Medication Sig Dispense Refill  . albuterol (VENTOLIN HFA) 108 (90 Base) MCG/ACT inhaler Inhale 2 puffs into the lungs every 6 (six) hours as needed for wheezing or shortness of breath. 1 Inhaler 2  . chlorpheniramine (CHLOR-TRIMETON) 4 MG tablet Take 4 mg by mouth every 4 (four) hours as needed for allergies.    . diphenhydrAMINE (BENADRYL) 25 MG tablet Take 25 mg by mouth at bedtime as needed for allergies.    Marland Kitchen loperamide (IMODIUM A-D) 2 MG tablet Take 2 mg by mouth 4 (four) times daily as needed for diarrhea or loose  stools.    Marland Kitchen loratadine (CLARITIN) 10 MG tablet Take 10 mg by mouth daily.    Marland Kitchen losartan (COZAAR) 25 MG tablet TAKE 1 TABLET BY MOUTH EVERY DAY 30 tablet 2  . Multiple Vitamins-Minerals (VITAMINS TO GO MEN PO) Take by mouth.    Marland Kitchen omeprazole (PRILOSEC) 20 MG capsule Take 20 mg by mouth daily as needed.     . pantoprazole (PROTONIX) 40 MG tablet TAKE ONE TABLET BY MOUTH ONCE DAILY 30 TO 60 MINUTES BEFORE FIRST MEAL OF THE DAY 90 tablet 0  . valACYclovir (VALTREX) 1000 MG tablet as directed.     . chlorpheniramine-HYDROcodone (TUSSIONEX) 10-8 MG/5ML SUER TAKE 5 MILLILITERS BY MOUTH TWICE A DAY AS NEEDED 230 mL 0  . azelastine (ASTELIN) 0.1 % nasal spray 1 spray 2 (two) times daily.  6  . b complex vitamins tablet Take 1 tablet by mouth daily.     No facility-administered medications prior to visit.    Review of Systems  Constitutional: Negative for chills, diaphoresis, fever, malaise/fatigue and weight loss.  HENT: Negative for congestion, ear pain and sore throat.   Respiratory: Positive for cough, sputum production, shortness of breath and wheezing. Negative for hemoptysis.   Cardiovascular: Negative for chest pain, palpitations and leg swelling.  Gastrointestinal: Negative for abdominal pain, heartburn and nausea.  Genitourinary: Negative for frequency.  Musculoskeletal: Negative for joint pain and myalgias.  Skin: Negative for itching and rash.  Neurological: Negative for dizziness, weakness and headaches.  Endo/Heme/Allergies: Does not bruise/bleed easily.  Psychiatric/Behavioral: Negative for depression. The patient is not nervous/anxious.      Objective:   Vitals:   05/24/20 0919  BP: 110/68  Pulse: 63  Temp: 97.9 F (36.6 C)  TempSrc: Oral  SpO2: 97%  Weight: 279 lb 6 oz (126.7 kg)  Height: 6' 2.5" (1.892 m)   SpO2: 97 %  Physical Exam: General: Well-appearing, no acute distress HENT: [place], AT Eyes: EOMI, no scleral icterus Respiratory: Clear to auscultation  bilaterally.  No crackles, wheezing or rales Cardiovascular: RRR, -M/R/G, no JVD Extremities:-Edema,-tenderness Neuro: AAO x4, CNII-XII grossly intact Skin: Intact, no rashes or bruising Psych: Normal mood, normal affect  Data Reviewed:  Imaging: CT Chest 07/05/18 - Minimal hazy mosaic attenuation throughout. No pulmonary nodules noted.  PFT: 05/24/20 FVC 2.8 (57%) FEV1 1.9 (52%) Ratio 67  Interpretation: Moderately severe obstructive defect  Labs: CBC    Component Value Date/Time   WBC 4.0 (L) 01/12/2011 1204   RBC 4.28 01/12/2011 1204   HGB 13.6 01/12/2011 1204   HCT 39.1 01/12/2011 1204   PLT 195.0 01/12/2011 1204   MCV 91.3 01/12/2011 1204   MCH 31.0 11/15/2010 1845  MCHC 34.8 01/12/2011 1204   RDW 14.2 01/12/2011 1204   LYMPHSABS 1.4 01/12/2011 1204   MONOABS 0.3 01/12/2011 1204   EOSABS 0.1 01/12/2011 1204   BASOSABS 0.0 01/12/2011 1204        Assessment & Plan:   Discussion: 74 year old male former smoker with childhood asthma who presents as new consult. Reviewed history and notes in EMR. Suspect asthma-COPD overlap syndrome. Uncontrolled symptoms. Not on maintenance inhalers. Counseled on inhaler use and demonstrated spacer technique.  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 Use spacer as instructed Please call if any issues with inhaler affordability --REFILL Tessionex cough syrup  Health Maintenance Immunization History  Administered Date(s) Administered  . Influenza Inj Mdck Quad Pf 06/02/2018  . Influenza Split 06/14/2014  . Influenza Whole 05/24/2009, 10/27/2010  . Influenza, High Dose Seasonal PF 07/17/2014, 07/22/2016, 05/28/2017, 06/24/2017, 04/10/2019  . Influenza,inj,Quad PF,6+ Mos 06/06/2015  . PFIZER SARS-COV-2 Vaccination 05/20/2020  . Pneumococcal Polysaccharide-23 04/14/2014  Patient plans for influenza with PCP/Pharmacy  CT Lung Screen - not qualified  No orders of the defined types were placed in this  encounter.  Meds ordered this encounter  Medications  . chlorpheniramine-HYDROcodone (TUSSIONEX) 10-8 MG/5ML SUER    Sig: TAKE 5 MILLILITERS BY MOUTH TWICE A DAY AS NEEDED    Dispense:  473 mL    Refill:  0  . Budeson-Glycopyrrol-Formoterol (BREZTRI AEROSPHERE) 160-9-4.8 MCG/ACT AERO    Sig: Inhale 2 puffs into the lungs in the morning and at bedtime.    Dispense:  10.7 g    Refill:  6    Return in about 3 months (around 08/24/2020).  [place], MD [facility] 05/24/2020 10:15 AM  Office Number [phone]

## 2020-05-24 NOTE — Telephone Encounter (Signed)
Called and spoke with patients wife Stanton Kidney who states that the Carlos Romero is going to be $640. I typed in a few other inhalers and they all fell under Tier 3. I offered to leave samples up front as well as patient assistance paperwork for them to fill out so that we can try and get medication covered. Advised her to either fill out paperwork when they came to pick it up or bring it back once they have completed their part. She expressed understanding. Samples and paperwork placed up front. Nothing further needed at this time.

## 2020-05-24 NOTE — Patient Instructions (Addendum)
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 Use spacer as instructed Please call if any issues with inhaler affordability --REFILL Tessionex cough syrup  Follow-up in 3 months with me

## 2020-06-09 NOTE — Progress Notes (Signed)
Cardiology Office Note:   Date:  06/10/2020  NAME:  Carlos Romero    MRN: [id number] DOB:  1946/06/21   PCP:  Gaynelle Arabian, MD  Cardiologist:  No primary care provider on file.  Electrophysiologist:  None   Referring MD: Gaynelle Arabian, MD   Chief Complaint  Patient presents with  . Shortness of Breath   History of Present Illness:   Carlos Romero is a 74 y.o. male with a hx of COPD, HLD who presents for follow-up of shortness of breath. Echo with normal EF and G1DD. Exercise SPECT with likely diaphragm attenuation. ETT 5:01; 5.5 METS. Frequent PVCs on stress test but none in recovery.   He reports he is doing well.  He is lost nearly 22 pounds.  Weight is gone down from 284 pounds to 262 pounds.  He reports he was also seen by pulmonology.  They have change around his inhalers.  His shortness of breath is improving.  Still gets short of breath with heavy exertion such as climbing stairs.  Overall does appear to be in proving.  He denies any chest pain or pressure.  BMI has gone down to 33.  Euvolemic on examination today.  Echocardiogram normal.  Overall reassuring that symptoms have improved with weight loss and management of COPD.  We did discuss coronary calcifications were seen on a CT scan of his chest in 2019.  He is okay start a daily aspirin and Crestor.  Most recent LDL cholesterol 113.  Would like this a bit lower now that we know he has coronary calcifications.  Problem List 1. Moderate COPD -20-25-pack-year history 2.  Hyperlipidemia -Total cholesterol 191, HDL 49, LDL 113, TG 163 3. Obesity -BMI 33 4. CAD -coronary calcifications in RCA on CT chest 2019 -Exercise SPECT: Reduced counts in the inferior segments with normal wall motion consistent with diaphragm attenuation, no ischemia  Past Medical History: Past Medical History:  Diagnosis Date  . Barrett's esophagus   . Chronic cough    onset Oct 2010, better after prednisone short course 12-2009,  sinus CT 11-06-10 bilateral maxillary, ethmoid and possibly frontal sinusitis>21 days augmentin  . COPD (chronic obstructive pulmonary disease) (HCC)    gold stage 2, PFTs 06-12-09 FEV1 2.25 (65%) ratio 62 and no better with B2  . Depression 1999  . GERD (gastroesophageal reflux disease)   . Hyperlipidemia   . Lung disease    LLL air space disease  . Prostate cancer ([place]) 2003  . PUD (peptic ulcer disease) 1967   s/p perf  . Pulmonary embolism ([place]) 1967  . Solitary lung nodule    RML    Past Surgical History: Past Surgical History:  Procedure Laterality Date  . PROSTATECTOMY  1999    Current Medications: Current Meds  Medication Sig  . albuterol (VENTOLIN HFA) 108 (90 Base) MCG/ACT inhaler Inhale 2 puffs into the lungs every 6 (six) hours as needed for wheezing or shortness of breath.  . Budeson-Glycopyrrol-Formoterol (BREZTRI AEROSPHERE) 160-9-4.8 MCG/ACT AERO Inhale 2 puffs into the lungs in the morning and at bedtime.  . chlorpheniramine (CHLOR-TRIMETON) 4 MG tablet Take 4 mg by mouth every 4 (four) hours as needed for allergies.  . chlorpheniramine-HYDROcodone (TUSSIONEX) 10-8 MG/5ML SUER TAKE 5 MILLILITERS BY MOUTH TWICE A DAY AS NEEDED  . diphenhydrAMINE (BENADRYL) 25 MG tablet Take 25 mg by mouth at bedtime as needed for allergies.  Marland Kitchen loperamide (IMODIUM A-D) 2 MG tablet Take 2 mg by mouth 4 (four) times daily  as needed for diarrhea or loose stools.  Marland Kitchen loratadine (CLARITIN) 10 MG tablet Take 10 mg by mouth daily.  Marland Kitchen losartan (COZAAR) 25 MG tablet TAKE 1 TABLET BY MOUTH EVERY DAY  . omeprazole (PRILOSEC) 20 MG capsule Take 20 mg by mouth daily as needed.   . pantoprazole (PROTONIX) 40 MG tablet TAKE ONE TABLET BY MOUTH ONCE DAILY 30 TO 60 MINUTES BEFORE FIRST MEAL OF THE DAY  . valACYclovir (VALTREX) 1000 MG tablet 1,000 mg as needed.      Allergies:    Fluoxetine, Morphine, Morphine sulfate, and Prozac [fluoxetine hcl]   Social History: Social History    Socioeconomic History  . Marital status: Married    Spouse name: Not on file  . Number of children: Not on file  . Years of education: Not on file  . Highest education level: Not on file  Occupational History  . Occupation: Retail buyer: Retired  Tobacco Use  . Smoking status: Former Smoker    Packs/day: 1.00    Years: 25.00    Pack years: 25.00    Types: Cigarettes    Quit date: 08/24/1986    Years since quitting: 33.8  . Smokeless tobacco: Never Used  Vaping Use  . Vaping Use: Never used  Substance and Sexual Activity  . Alcohol use: Yes    Alcohol/week: 0.0 standard drinks    Comment: beer once in a while   . Drug use: Never  . Sexual activity: Not on file  Other Topics Concern  . Not on file  Social History Narrative  . Not on file   Social Determinants of Health   Financial Resource Strain:   . Difficulty of Paying Living Expenses: Not on file  Food Insecurity:   . Worried About Charity fundraiser in the Last Year: Not on file  . Ran Out of Food in the Last Year: Not on file  Transportation Needs:   . Lack of Transportation (Medical): Not on file  . Lack of Transportation (Non-Medical): Not on file  Physical Activity:   . Days of Exercise per Week: Not on file  . Minutes of Exercise per Session: Not on file  Stress:   . Feeling of Stress : Not on file  Social Connections:   . Frequency of Communication with Friends and Family: Not on file  . Frequency of Social Gatherings with Friends and Family: Not on file  . Attends Religious Services: Not on file  . Active Member of Clubs or Organizations: Not on file  . Attends Archivist Meetings: Not on file  . Marital Status: Not on file     Family History: The patient's family history includes Asthma in his mother; Cancer in his mother; Emphysema in an other family member; Stroke in his mother.  ROS:   All other ROS reviewed and negative. Pertinent positives noted in the HPI.      EKGs/Labs/Other Studies Reviewed:   The following studies were personally reviewed by me today:  TTE 04/22/2020 1. Technically difficult study. Left ventricular ejection fraction, by  estimation, is 60 to 65%. The left ventricle has grossly normal function.  Left ventricular endocardial border not optimally defined to evaluate  regional wall motion. There is mild  left ventricular hypertrophy. Left ventricular diastolic parameters are  consistent with Grade I diastolic dysfunction (impaired relaxation).  2. Right ventricular systolic function is normal. The right ventricular  size is mildly enlarged. Tricuspid regurgitation signal is inadequate for  assessing PA pressure.  3. The mitral valve is normal in structure. No evidence of mitral valve  regurgitation.  4. The aortic valve was not well visualized. Aortic valve regurgitation  is not visualized. No aortic stenosis is present.  5. There is mild dilatation of the ascending aorta measuring 37 mm.  6. The inferior vena cava is normal in size with greater than 50%  respiratory variability, suggesting right atrial pressure of 3 mmHg.   Recent Labs: 04/05/2020: BNP 90.1   Recent Lipid Panel    Component Value Date/Time   CHOL 191 04/05/2020 0957   TRIG 163 (H) 04/05/2020 0957   HDL 49 04/05/2020 0957   CHOLHDL 3.9 04/05/2020 0957   LDLCALC 113 (H) 04/05/2020 0957    Physical Exam:   VS:  BP 120/80   Pulse 77   Ht 6\' 2"  (1.88 m)   Wt 262 lb 3.2 oz (118.9 kg)   SpO2 97%   BMI 33.66 kg/m    Wt Readings from Last 3 Encounters:  06/10/20 262 lb 3.2 oz (118.9 kg)  05/24/20 279 lb 6 oz (126.7 kg)  04/09/20 284 lb (128.8 kg)    General: Well nourished, well developed, in no acute distress Heart: Atraumatic, normal size  Eyes: PEERLA, EOMI  Neck: Supple, no JVD Endocrine: No thryomegaly Cardiac: Normal S1, S2; RRR; no murmurs, rubs, or gallops Lungs: Clear to auscultation bilaterally, no wheezing, rhonchi or rales   Abd: Soft, nontender, no hepatomegaly  Ext: No edema, pulses 2+ Musculoskeletal: No deformities, BUE and BLE strength normal and equal Skin: Warm and dry, no rashes   Neuro: Alert and oriented to person, place, time, and situation, CNII-XII grossly intact, no focal deficits  Psych: Normal mood and affect   ASSESSMENT:   Carlos Romero is a 74 y.o. male who presents for the following: 1. SOB (shortness of breath) on exertion   2. Mixed hyperlipidemia     PLAN:   1. SOB (shortness of breath) on exertion -Recent echo with normal EF.  Recent stress test with diaphragm attenuation and no ischemia.  This is a normal stress test on my review.  BNP was normal.  No evidence of volume overload on exam.  He has no evidence of congestive heart failure.  Symptoms of shortness of breath have improved with weight loss and treatment of COPD.  Suspect this is deconditioning as well as obesity related and COPD related.  He will continue to lose weight and exercise more.  He will continue with pulmonology.  I recommended no further cardiac testing at this time.  2. Mixed hyperlipidemia -Did have coronary calcifications in the RCA territory in 2019.  Most recent LDL cholesterol 113.  I would like for him to start a statin Crestor 20 mg daily and aspirin 81 mg daily.  I will see him yearly.   Disposition: Return in about 1 year (around 06/10/2021).  Medication Adjustments/Labs and Tests Ordered: Current medicines are reviewed at length with the patient today.  Concerns regarding medicines are outlined above.  No orders of the defined types were placed in this encounter.  Meds ordered this encounter  Medications  . rosuvastatin (CRESTOR) 20 MG tablet    Sig: Take 1 tablet (20 mg total) by mouth daily.    Dispense:  90 tablet    Refill:  3  . aspirin EC 81 MG tablet    Sig: Take 1 tablet (81 mg total) by mouth daily. Swallow whole.    Dispense:  30 tablet    Refill:  11    Patient Instructions   Medication Instructions:  START taking a baby aspirin (81 mg) daily START taking Crestor 20 mg daily *If you need a refill on your cardiac medications before your next appointment, please call your pharmacy*   Lab Work: None ordered If you have labs (blood work) drawn today and your tests are completely normal, you will receive your results only by: Marland Kitchen MyChart Message (if you have MyChart) OR . A paper copy in the mail If you have any lab test that is abnormal or we need to change your treatment, we will call you to review the results.   Testing/Procedures: None ordered   Follow-Up: At [facility], you and your health needs are our priority.  As part of our continuing mission to provide you with exceptional heart care, we have created designated Provider Care Teams.  These Care Teams include your primary Cardiologist (physician) and Advanced Practice Providers (APPs -  Physician Assistants and Nurse Practitioners) who all work together to provide you with the care you need, when you need it.  We recommend signing up for the patient portal called "MyChart".  Sign up information is provided on this After Visit Summary.  MyChart is used to connect with patients for Virtual Visits (Telemedicine).  Patients are able to view lab/test results, encounter notes, upcoming appointments, etc.  Non-urgent messages can be sent to your provider as well.   To learn more about what you can do with MyChart, go to [email or web address].    Your next appointment:   12 months  The format for your next appointment:   In Person  Provider:   Eleonore Chiquito, MD   Other Instructions None     Time Spent with Patient: I have spent a total of 35 minutes with patient reviewing hospital notes, telemetry, EKGs, labs and examining the patient as well as establishing an assessment and plan that was discussed with the patient.  > 50% of time was spent in direct patient care.  Signed, Addison Naegeli. Audie Box,  [place]  [address], [place], [place] [address] [phone]  06/10/2020 9:53 AM

## 2020-06-10 ENCOUNTER — Ambulatory Visit (INDEPENDENT_AMBULATORY_CARE_PROVIDER_SITE_OTHER): Payer: Medicare Other | Admitting: Cardiovascular Disease

## 2020-06-10 ENCOUNTER — Other Ambulatory Visit: Payer: Self-pay

## 2020-06-10 ENCOUNTER — Encounter: Payer: Self-pay | Admitting: Cardiovascular Disease

## 2020-06-10 VITALS — BP 120/80 | HR 77 | Ht 74.0 in | Wt 262.2 lb

## 2020-06-10 DIAGNOSIS — E782 Mixed hyperlipidemia: Secondary | ICD-10-CM | POA: Diagnosis not present

## 2020-06-10 DIAGNOSIS — R0602 Shortness of breath: Secondary | ICD-10-CM

## 2020-06-10 MED ORDER — ASPIRIN EC 81 MG PO TBEC: 81.0000 mg | DELAYED_RELEASE_TABLET | Freq: Every day | ORAL | 11 refills | Status: AC

## 2020-06-10 MED ORDER — ROSUVASTATIN CALCIUM 20 MG PO TABS: 20.0000 mg | ORAL_TABLET | Freq: Every day | ORAL | 3 refills | Status: DC

## 2020-06-10 NOTE — Patient Instructions (Signed)
Medication Instructions:  START taking a baby aspirin (81 mg) daily START taking Crestor 20 mg daily *If you need a refill on your cardiac medications before your next appointment, please call your pharmacy*   Lab Work: None ordered If you have labs (blood work) drawn today and your tests are completely normal, you will receive your results only by: Marland Kitchen MyChart Message (if you have MyChart) OR . A paper copy in the mail If you have any lab test that is abnormal or we need to change your treatment, we will call you to review the results.   Testing/Procedures: None ordered   Follow-Up: At [facility], you and your health needs are our priority.  As part of our continuing mission to provide you with exceptional heart care, we have created designated Provider Care Teams.  These Care Teams include your primary Cardiologist (physician) and Advanced Practice Providers (APPs -  Physician Assistants and Nurse Practitioners) who all work together to provide you with the care you need, when you need it.  We recommend signing up for the patient portal called "MyChart".  Sign up information is provided on this After Visit Summary.  MyChart is used to connect with patients for Virtual Visits (Telemedicine).  Patients are able to view lab/test results, encounter notes, upcoming appointments, etc.  Non-urgent messages can be sent to your provider as well.   To learn more about what you can do with MyChart, go to [email or web address].    Your next appointment:   12 months  The format for your next appointment:   In Person  Provider:   Eleonore Chiquito, MD   Other Instructions None

## 2020-06-20 ENCOUNTER — Telehealth: Payer: Self-pay | Admitting: Cardiovascular Disease

## 2020-06-20 MED ORDER — ATORVASTATIN CALCIUM 40 MG PO TABS: 40.0000 mg | ORAL_TABLET | Freq: Every day | ORAL | 11 refills | Status: DC

## 2020-06-20 NOTE — Telephone Encounter (Signed)
Returned call to pt.  Advised to discontinue Crestor and start Lipitor 40 mg one tablet daily per Dr. Audie Box.  Pt indicates understanding.

## 2020-06-20 NOTE — Telephone Encounter (Signed)
Does he have kidney disease? I was unaware of any kidney disease. If he does, we can prescribe lipitor 40 mg daily.   [place] T. Audie Box, [place]  [address] [place],  [address] [phone]  2:16 PM

## 2020-06-20 NOTE — Telephone Encounter (Signed)
    Pt c/o medication issue:  1. Name of Medication: rosuvastatin (CRESTOR) 20 MG tablet  2. How are you currently taking this medication (dosage and times per day)? Take 1 tablet (20 mg total) by mouth daily.  3. Are you having a reaction (difficulty breathing--STAT)?   4. What is your medication issue? Pt's wife is concern about the side effects of this drug. She said the pt has only one kidney and wanted to make sure if the pt is still ok to take this

## 2020-06-21 ENCOUNTER — Encounter: Payer: Self-pay | Admitting: Pulmonary Disease

## 2020-06-28 NOTE — Telephone Encounter (Signed)
Nothing noted in the encounter. Encounter created in error. Will sign off.

## 2020-07-03 ENCOUNTER — Ambulatory Visit: Payer: Medicare Other | Admitting: Internal Medicine

## 2020-07-08 NOTE — Telephone Encounter (Signed)
Dr. Loanne Drilling, Please see patient regarding inhaler and thrush and advise.  Thank you.

## 2020-07-11 MED ORDER — STIOLTO RESPIMAT 2.5-2.5 MCG/ACT IN AERS: 2.0000 | INHALATION_SPRAY | Freq: Every day | RESPIRATORY_TRACT | 6 refills | Status: DC

## 2020-07-11 NOTE — Telephone Encounter (Signed)
Dr Loanne Drilling, please advise on pt email:  Dr.Ellison; I messaged you the 1st of the week regarding the thrush I am experiencing after I've been using the [facility] for a while. I have been thoroughly rincing with water and mouthwash after each use.  Is there anything I could be doing different or some kind of med I could be taking?  Best regard, Trayveon Beckford [birth year] [email or web address]

## 2020-07-11 NOTE — Telephone Encounter (Signed)
Patient's plan covers Anoro, Stiolto, and Bevespi- all have a $142.00 copay for 1 month supply, due to patient having a remaining pharmacy deductible of $95.00. Once met, his copay will be $47.00 for 1 month. But patient's deductible will likely reset January 1.   Per chart, patient was applying for patient assistance for [facility]. He can apply for patient assistance for the chosen inhaler.   Also, the pharmacy team has moved it's focus to strictly biologics and no longer has the capacity to do benefits investigations for inhalers. Sorry for the inconvenience.

## 2020-07-11 NOTE — Telephone Encounter (Signed)
Called patient. Despite good response to [facility], I recommended discontinuing this inhaler due to the steroid component causing thrush on his lips. I have messaged pharmacy to do a price check on LABA/LAMA inhalers. Patient previously had issues affording inhalers and would like to avoid that.

## 2020-07-11 NOTE — Telephone Encounter (Signed)
Please contact patient regarding the following:  Stiolto has been ordered. This will replace Breztri. If we have samples, please offer for patient to pick up and demonstrate inhaler technique. Please also let patient know pharmacy's note regarding needing to meet his deductible and being able to continue to apply for patient assistance for Stioloto.

## 2020-07-12 MED ORDER — STIOLTO RESPIMAT 2.5-2.5 MCG/ACT IN AERS: 2.0000 | INHALATION_SPRAY | Freq: Every day | RESPIRATORY_TRACT | 0 refills | Status: DC

## 2020-07-22 ENCOUNTER — Other Ambulatory Visit: Payer: Self-pay | Admitting: *Deleted

## 2020-07-22 MED ORDER — ATORVASTATIN CALCIUM 40 MG PO TABS: 40.0000 mg | ORAL_TABLET | Freq: Every day | ORAL | 1 refills | Status: DC

## 2020-07-22 NOTE — Telephone Encounter (Signed)
Rx has been sent to the pharmacy electronically. ° °

## 2020-08-11 ENCOUNTER — Other Ambulatory Visit: Payer: Self-pay | Admitting: Adult Health

## 2020-08-11 DIAGNOSIS — I517 Cardiomegaly: Secondary | ICD-10-CM

## 2020-08-11 DIAGNOSIS — I5189 Other ill-defined heart diseases: Secondary | ICD-10-CM

## 2020-08-19 ENCOUNTER — Telehealth: Payer: Self-pay | Admitting: Cardiovascular Disease

## 2020-08-19 NOTE — Telephone Encounter (Signed)
Spoke with patient's wife(okay per DPR) called into office in regards to patients current medication list. Current medications on patients chart were reviewed with patients wife. Patients wife would like to check with Dr. Flora Lipps and see if patient should continue taking Losartan 25mg  daily. Advised patient's wife I would forward message to Dr. . Patients wife verbalized understanding.

## 2020-08-19 NOTE — Telephone Encounter (Signed)
If prescribed for BP, this is fine. -W

## 2020-08-19 NOTE — Telephone Encounter (Signed)
° ° °  Pt's wife calling, she wants to speak with a nurse to discuss what pt is currently taking

## 2020-08-20 ENCOUNTER — Other Ambulatory Visit: Payer: Self-pay

## 2020-08-20 DIAGNOSIS — I5189 Other ill-defined heart diseases: Secondary | ICD-10-CM

## 2020-08-20 DIAGNOSIS — I517 Cardiomegaly: Secondary | ICD-10-CM

## 2020-08-20 MED ORDER — LOSARTAN POTASSIUM 25 MG PO TABS: 25.0000 mg | ORAL_TABLET | Freq: Every day | ORAL | 2 refills | Status: DC

## 2020-08-20 NOTE — Telephone Encounter (Signed)
Called patients wife- advised okay to continue Losartan for now.  Sent in RX to pharmacy

## 2020-09-16 ENCOUNTER — Telehealth: Payer: Self-pay | Admitting: Pulmonary Disease

## 2020-09-16 NOTE — Telephone Encounter (Signed)
Left message for patient to call back  

## 2020-09-17 ENCOUNTER — Ambulatory Visit: Payer: Medicare Other | Admitting: Pulmonary Disease

## 2020-09-17 ENCOUNTER — Other Ambulatory Visit: Payer: Self-pay

## 2020-09-17 ENCOUNTER — Encounter: Payer: Self-pay | Admitting: Pulmonary Disease

## 2020-09-17 VITALS — BP 116/72 | HR 53 | Temp 97.7°F | Ht 74.0 in | Wt 262.2 lb

## 2020-09-17 DIAGNOSIS — J4489 Other specified chronic obstructive pulmonary disease: Secondary | ICD-10-CM

## 2020-09-17 DIAGNOSIS — J449 Chronic obstructive pulmonary disease, unspecified: Secondary | ICD-10-CM | POA: Diagnosis not present

## 2020-09-17 MED ORDER — BREZTRI AEROSPHERE 160-9-4.8 MCG/ACT IN AERO: 2.0000 | INHALATION_SPRAY | Freq: Two times a day (BID) | RESPIRATORY_TRACT | 0 refills | Status: DC

## 2020-09-17 MED ORDER — BREZTRI AEROSPHERE 160-9-4.8 MCG/ACT IN AERO: 2.0000 | INHALATION_SPRAY | Freq: Two times a day (BID) | RESPIRATORY_TRACT | 6 refills | Status: DC

## 2020-09-17 NOTE — Progress Notes (Signed)
Subjective:   PATIENT ID: Carlos Romero GENDER: male DOB: May 24, 1946, MRN: [id number]   HPI  Chief Complaint  Patient presents with  . Follow-up    Pt stated that the [facility] is some better.  He stated that he is coughing more now--productive cough-clear sputum.  He feels that the stiolto is not helping     Reason for Visit: New consult for shortness of breath  [name] is a 75 year old male former smoker with COPD, hx childhood asthma, prostate cancer s/p prostatectomy in 1999 and radiation in 2002 and solitary kidney who presents for follow-up.  Wife present. Currently on Stioloto. Tolerating well and no longer having any thrush. Reports since stopping the [facility] he has redeveloped productive cough with thin clear sputum. He still has significant dyspnea with exertion and limits himself to walking short distances within the house. Previously on Trelegy without effect. He feels he did better on the Breztri with the spacer and thinks that perhaps he had thrush prior to starting it. No exacerbations since our last visit requiring steroids. No ED/urgent care visits.  Social History: Quit smoking 30 years ago.  Worked in [employer] exposures: None  I have personally reviewed patient's past medical/family/social history/allergies/current medications.  Past Medical History:  Diagnosis Date  . Barrett's esophagus   . Chronic cough    onset Oct 2010, better after prednisone short course 12-2009, sinus CT 11-06-10 bilateral maxillary, ethmoid and possibly frontal sinusitis>21 days augmentin  . COPD (chronic obstructive pulmonary disease) (HCC)    gold stage 2, PFTs 06-12-09 FEV1 2.25 (65%) ratio 62 and no better with B2  . Depression 1999  . GERD (gastroesophageal reflux disease)   . Hyperlipidemia   . Lung disease    LLL air space disease  . Prostate cancer ([place]) 2003  . PUD (peptic ulcer disease) 1967   s/p perf  . Pulmonary embolism ([place]) 1967  .  Solitary lung nodule    RML     Allergies  Allergen Reactions  . Fluoxetine Other (See Comments)    Vivid dreams  . Morphine     REACTION: "feels crazy"  . Budesonide Other (See Comments)    Thrush  . Morphine Sulfate Anxiety  . Prozac [Fluoxetine Hcl] Anxiety     Outpatient Medications Prior to Visit  Medication Sig Dispense Refill  . albuterol (VENTOLIN HFA) 108 (90 Base) MCG/ACT inhaler Inhale 2 puffs into the lungs every 6 (six) hours as needed for wheezing or shortness of breath. 1 Inhaler 2  . aspirin EC 81 MG tablet Take 1 tablet (81 mg total) by mouth daily. Swallow whole. 30 tablet 11  . atorvastatin (LIPITOR) 40 MG tablet Take 1 tablet (40 mg total) by mouth daily. 90 tablet 1  . chlorpheniramine (CHLOR-TRIMETON) 4 MG tablet Take 4 mg by mouth every 4 (four) hours as needed for allergies.    . chlorpheniramine-HYDROcodone (TUSSIONEX) 10-8 MG/5ML SUER TAKE 5 MILLILITERS BY MOUTH TWICE A DAY AS NEEDED 473 mL 0  . diphenhydrAMINE (BENADRYL) 25 MG tablet Take 25 mg by mouth at bedtime as needed for allergies.    Marland Kitchen latanoprost (XALATAN) 0.005 % ophthalmic solution     . loperamide (IMODIUM A-D) 2 MG tablet Take 2 mg by mouth 4 (four) times daily as needed for diarrhea or loose stools.    Marland Kitchen loratadine (CLARITIN) 10 MG tablet Take 10 mg by mouth daily.    Marland Kitchen losartan (COZAAR) 25 MG tablet  Take 1 tablet (25 mg total) by mouth daily. 30 tablet 2  . omeprazole (PRILOSEC) 20 MG capsule Take 20 mg by mouth daily as needed.     . pantoprazole (PROTONIX) 40 MG tablet TAKE ONE TABLET BY MOUTH ONCE DAILY 30 TO 60 MINUTES BEFORE FIRST MEAL OF THE DAY 90 tablet 0  . Tiotropium Bromide-Olodaterol (STIOLTO RESPIMAT) 2.5-2.5 MCG/ACT AERS Inhale 2 puffs into the lungs daily. 1 each 6  . valACYclovir (VALTREX) 1000 MG tablet 1,000 mg as needed.     . Tiotropium Bromide-Olodaterol (STIOLTO RESPIMAT) 2.5-2.5 MCG/ACT AERS Inhale 2 puffs into the lungs daily. 4 g 0   No facility-administered  medications prior to visit.    Review of Systems  Constitutional: Negative for chills, diaphoresis, fever, malaise/fatigue and weight loss.  HENT: Negative for congestion.   Respiratory: Positive for cough, sputum production and shortness of breath. Negative for hemoptysis and wheezing.   Cardiovascular: Negative for chest pain, palpitations and leg swelling.     Objective:   Vitals:   09/17/20 1056  BP: 116/72  Pulse: (!) 53  Temp: 97.7 F (36.5 C)  TempSrc: Tympanic  SpO2: 95%  Weight: 262 lb 4 oz (119 kg)  Height: 6\' 2"  (1.88 m)   SpO2: 95 %  Physical Exam: General: Well-appearing, no acute distress HENT: [place], AT Eyes: EOMI, no scleral icterus Respiratory: Clear to auscultation bilaterally.  No crackles, wheezing or rales Cardiovascular: RRR, -M/R/G, no JVD Extremities:-Edema,-tenderness Neuro: AAO x4, CNII-XII grossly intact Psych: Normal mood, normal affect   Data Reviewed:  Imaging: CT Chest 07/05/18 - Minimal hazy mosaic attenuation throughout. No pulmonary nodules noted.  PFT: 05/24/20 FVC 2.8 (57%) FEV1 1.9 (52%) Ratio 67  Interpretation: Moderately severe obstructive defect  Labs: CBC    Component Value Date/Time   WBC 4.0 (L) 01/12/2011 1204   RBC 4.28 01/12/2011 1204   HGB 13.6 01/12/2011 1204   HCT 39.1 01/12/2011 1204   PLT 195.0 01/12/2011 1204   MCV 91.3 01/12/2011 1204   MCH 31.0 11/15/2010 1845   MCHC 34.8 01/12/2011 1204   RDW 14.2 01/12/2011 1204   LYMPHSABS 1.4 01/12/2011 1204   MONOABS 0.3 01/12/2011 1204   EOSABS 0.1 01/12/2011 1204   BASOSABS 0.0 01/12/2011 1204   Imaging, labs and test noted above have been reviewed independently by me. No new tests since last visit.    Assessment & Plan:   Discussion: 75 year old male former smoker with childhood asthma who presents for follow-up. Previously well controlled on Breztri however discontinued for thrush. Not as well-controlled on Stioloto. Discussed inhaler technique with  spacer. After discussion, patient wishes to re-trial Breztri.  Moderate-severe COPD with asthma overlap (FEV 52%) - uncontrolled --STOP Stioloto --RESTART Breztri TWO puffs TWICE a day. Use with provided spacer as demonstrated in the office  Health Maintenance Immunization History  Administered Date(s) Administered  . Influenza Inj Mdck Quad Pf 06/02/2018  . Influenza Split 06/14/2014  . Influenza Whole 05/24/2009, 10/27/2010  . Influenza, High Dose Seasonal PF 07/17/2014, 07/22/2016, 05/28/2017, 06/24/2017, 04/10/2019  . Influenza,inj,Quad PF,6+ Mos 06/06/2015  . PFIZER(Purple Top)SARS-COV-2 Vaccination 10/05/2019, 10/30/2019, 05/20/2020  . Pneumococcal Polysaccharide-23 04/14/2014    CT Lung Screen - not qualified  No orders of the defined types were placed in this encounter.  Meds ordered this encounter  Medications  . Budeson-Glycopyrrol-Formoterol (BREZTRI AEROSPHERE) 160-9-4.8 MCG/ACT AERO    Sig: Inhale 2 puffs into the lungs in the morning and at bedtime.    Dispense:  5.9  g    Refill:  6  . Budeson-Glycopyrrol-Formoterol (BREZTRI AEROSPHERE) 160-9-4.8 MCG/ACT AERO    Sig: Inhale 2 puffs into the lungs in the morning and at bedtime.    Dispense:  9.1 g    Refill:  0    Order Specific Question:   Lot Number?    Answer:   [id number]    Order Specific Question:   Expiration Date?    Answer:   03/22/2022    Order Specific Question:   Manufacturer?    Answer:   [employer] [[id number]]    Order Specific Question:   Quantity    Answer:   2    Return in about 3 months (around 12/16/2020).  Lino Lakes, MD [facility] 09/17/2020 11:15 AM  Office Number [phone]

## 2020-09-17 NOTE — Patient Instructions (Addendum)
Moderate-severe COPD with asthma overlap --STOP Stioloto --RESTART Breztri TWO puffs TWICE a day. Use with provided spacer as demonstrated in the office  Follow-up with me in 3 months

## 2020-09-19 ENCOUNTER — Encounter: Payer: Self-pay | Admitting: Pulmonary Disease

## 2020-09-19 NOTE — Telephone Encounter (Signed)
Lmtcb for pt.  

## 2020-09-20 DIAGNOSIS — Z01812 Encounter for preprocedural laboratory examination: Secondary | ICD-10-CM | POA: Diagnosis not present

## 2020-09-25 DIAGNOSIS — Z86010 Personal history of colonic polyps: Secondary | ICD-10-CM | POA: Diagnosis not present

## 2020-09-25 DIAGNOSIS — D122 Benign neoplasm of ascending colon: Secondary | ICD-10-CM | POA: Diagnosis not present

## 2020-09-27 DIAGNOSIS — D122 Benign neoplasm of ascending colon: Secondary | ICD-10-CM | POA: Diagnosis not present

## 2020-10-01 NOTE — Telephone Encounter (Signed)
Pt seen in office on 1.25.22. Will close encounter.

## 2020-10-29 ENCOUNTER — Other Ambulatory Visit: Payer: Self-pay

## 2020-10-29 DIAGNOSIS — I517 Cardiomegaly: Secondary | ICD-10-CM

## 2020-10-29 DIAGNOSIS — I5189 Other ill-defined heart diseases: Secondary | ICD-10-CM

## 2020-10-29 MED ORDER — LOSARTAN POTASSIUM 25 MG PO TABS: 25.0000 mg | ORAL_TABLET | Freq: Every day | ORAL | 2 refills | Status: DC

## 2020-11-18 ENCOUNTER — Other Ambulatory Visit: Payer: Self-pay | Admitting: Cardiovascular Disease

## 2020-11-18 DIAGNOSIS — I517 Cardiomegaly: Secondary | ICD-10-CM

## 2020-11-18 DIAGNOSIS — I5189 Other ill-defined heart diseases: Secondary | ICD-10-CM

## 2020-12-04 ENCOUNTER — Other Ambulatory Visit: Payer: Self-pay | Admitting: Cardiovascular Disease

## 2021-01-10 ENCOUNTER — Ambulatory Visit: Payer: Medicare Other | Admitting: Pulmonary Disease

## 2021-01-10 ENCOUNTER — Other Ambulatory Visit: Payer: Self-pay

## 2021-01-10 ENCOUNTER — Encounter: Payer: Self-pay | Admitting: Pulmonary Disease

## 2021-01-10 VITALS — BP 116/80 | HR 76 | Ht 74.0 in | Wt 253.0 lb

## 2021-01-10 DIAGNOSIS — J449 Chronic obstructive pulmonary disease, unspecified: Secondary | ICD-10-CM

## 2021-01-10 DIAGNOSIS — Z8546 Personal history of malignant neoplasm of prostate: Secondary | ICD-10-CM | POA: Insufficient documentation

## 2021-01-10 DIAGNOSIS — I2510 Atherosclerotic heart disease of native coronary artery without angina pectoris: Secondary | ICD-10-CM | POA: Insufficient documentation

## 2021-01-10 DIAGNOSIS — K5730 Diverticulosis of large intestine without perforation or abscess without bleeding: Secondary | ICD-10-CM | POA: Insufficient documentation

## 2021-01-10 DIAGNOSIS — R1310 Dysphagia, unspecified: Secondary | ICD-10-CM | POA: Insufficient documentation

## 2021-01-10 DIAGNOSIS — Z86010 Personal history of colonic polyps: Secondary | ICD-10-CM | POA: Insufficient documentation

## 2021-01-10 MED ORDER — ALBUTEROL SULFATE HFA 108 (90 BASE) MCG/ACT IN AERS: 2.0000 | INHALATION_SPRAY | Freq: Four times a day (QID) | RESPIRATORY_TRACT | 2 refills | Status: DC | PRN

## 2021-01-10 MED ORDER — IPRATROPIUM-ALBUTEROL 0.5-2.5 (3) MG/3ML IN SOLN: 3.0000 mL | Freq: Four times a day (QID) | RESPIRATORY_TRACT | 6 refills | Status: DC | PRN

## 2021-01-10 NOTE — Patient Instructions (Signed)
Moderate-severe COPD with asthma overlap (FEV 52%) --START Albuterol as needed for shortness of breath/wheezing or PRIOR to activity --Will order nebulizer  --START DUONEB twice a day as needed. Can use up to four times a day maximum  Follow-up with me in 6 months

## 2021-01-10 NOTE — Progress Notes (Signed)
Subjective:   PATIENT ID: Carlos Romero: male DOB: [birth year], MRN: [id number]    Chief Complaint  Patient presents with  . COPD    Carlos Romero is not working, causes worse cough    Reason for Visit: Follow-up  Carlos Romero is a 75 year old male former smoker with COPD, hx childhood asthma, prostate cancer s/p prostatectomy in 1999 and radiation in 2002 and solitary kidney who presents for follow-up.  He reports with the [facility] he had cough with inhaler use so he stopped using it. He has not been on any inhalers. He does have shortness of breath on exertion, cough and wheeze however no limits in activity. He started going back to the gym one month ago four days a week. He reports 45 min of cardio with each session. He rarely uses albuterol. He has lost 25lbs in the last year. Rarely wheezes. Denies ED/urgent care visits  Social History: Quit smoking 30 years ago.  Worked in [employer] exposures: None  I have personally reviewed patient's past medical/family/social history/allergies/current medications.  Past Medical History:  Diagnosis Date  . Barrett's esophagus   . Chronic cough    onset Oct 2010, better after prednisone short course 12-2009, sinus CT 11-06-10 bilateral maxillary, ethmoid and possibly frontal sinusitis>21 days augmentin  . COPD (chronic obstructive pulmonary disease) (HCC)    gold stage 2, PFTs 06-12-09 FEV1 2.25 (65%) ratio 62 and no better with B2  . Depression 1999  . GERD (gastroesophageal reflux disease)   . Hyperlipidemia   . Lung disease    LLL air space disease  . Prostate cancer ([place]) 2003  . PUD (peptic ulcer disease) 1967   s/p perf  . Pulmonary embolism ([place]) 1967  . Solitary lung nodule    RML     Allergies  Allergen Reactions  . Breztri Aerosphere [Budeson-Glycopyrrol-Formoterol] Cough  . Fluoxetine Other (See Comments)    Vivid dreams  . Fluticasone-Umeclidin-Vilant     Other reaction(s): increased  cough  . Lansoprazole     Other reaction(s): diarrhea  . Morphine     REACTION: "feels crazy"  . Other     Other reaction(s): vivid dreams  . Venlafaxine     Other reaction(s): Other (See Comments)  . Budesonide Other (See Comments)    Thrush  . Morphine Sulfate Anxiety  . Prozac [Fluoxetine Hcl] Anxiety     Outpatient Medications Prior to Visit  Medication Sig Dispense Refill  . albuterol (VENTOLIN HFA) 108 (90 Base) MCG/ACT inhaler Inhale 2 puffs into the lungs every 6 (six) hours as needed for wheezing or shortness of breath. 1 Inhaler 2  . aspirin EC 81 MG tablet Take 1 tablet (81 mg total) by mouth daily. Swallow whole. 30 tablet 11  . atorvastatin (LIPITOR) 40 MG tablet TAKE 1 TABLET BY MOUTH  DAILY 90 tablet 3  . chlorpheniramine (CHLOR-TRIMETON) 4 MG tablet Take 4 mg by mouth every 4 (four) hours as needed for allergies.    . chlorpheniramine-HYDROcodone (TUSSIONEX) 10-8 MG/5ML SUER TAKE 5 MILLILITERS BY MOUTH TWICE A DAY AS NEEDED 473 mL 0  . diphenhydrAMINE (BENADRYL) 25 MG tablet Take 25 mg by mouth at bedtime as needed for allergies.    Marland Kitchen latanoprost (XALATAN) 0.005 % ophthalmic solution     . loperamide (IMODIUM A-D) 2 MG tablet Take 2 mg by mouth 4 (four) times daily as needed for diarrhea or loose stools.    Marland Kitchen loratadine (CLARITIN) 10  MG tablet Take 10 mg by mouth daily.    Marland Kitchen losartan (COZAAR) 25 MG tablet TAKE 1 TABLET (25 MG TOTAL) BY MOUTH DAILY. 90 tablet 2  . omeprazole (PRILOSEC) 20 MG capsule Take 20 mg by mouth daily as needed.     . pantoprazole (PROTONIX) 40 MG tablet TAKE ONE TABLET BY MOUTH ONCE DAILY 30 TO 60 MINUTES BEFORE FIRST MEAL OF THE DAY 90 tablet 0  . Tiotropium Bromide-Olodaterol (STIOLTO RESPIMAT) 2.5-2.5 MCG/ACT AERS Inhale 2 puffs into the lungs daily. 1 each 6  . valACYclovir (VALTREX) 1000 MG tablet 1,000 mg as needed.     . Budeson-Glycopyrrol-Formoterol (BREZTRI AEROSPHERE) 160-9-4.8 MCG/ACT AERO Inhale 2 puffs into the lungs in the morning  and at bedtime. 5.9 g 6  . Budeson-Glycopyrrol-Formoterol (BREZTRI AEROSPHERE) 160-9-4.8 MCG/ACT AERO Inhale 2 puffs into the lungs in the morning and at bedtime. 9.1 g 0   No facility-administered medications prior to visit.    Review of Systems  Constitutional: Negative for chills, diaphoresis, fever, malaise/fatigue and weight loss.  HENT: Negative for congestion.   Respiratory: Positive for cough, shortness of breath and wheezing. Negative for hemoptysis and sputum production.   Cardiovascular: Negative for chest pain, palpitations and leg swelling.     Objective:   Vitals:   01/10/21 1200  BP: 116/80  Pulse: 76  SpO2: 96%  Weight: 253 lb (114.8 kg)  Height: 6\' 2"  (1.88 m)   SpO2: 96 %  Physical Exam: General: Well-appearing, no acute distress HENT: [place], AT Eyes: EOMI, no scleral icterus Respiratory: Clear to auscultation bilaterally.  No crackles, wheezing or rales Cardiovascular: RRR, -M/R/G, no JVD Extremities:-Edema,-tenderness Neuro: AAO x4, CNII-XII grossly intact Skin: Intact, no rashes or bruising Psych: Normal mood, normal affect   Data Reviewed:  Imaging: CT Chest 07/05/18 - Minimal hazy mosaic attenuation throughout. No pulmonary nodules noted.  PFT: 05/24/20 FVC 2.8 (57%) FEV1 1.9 (52%) Ratio 67  Interpretation: Moderately severe obstructive defect  Labs: CBC    Component Value Date/Time   WBC 4.0 (L) 01/12/2011 1204   RBC 4.28 01/12/2011 1204   HGB 13.6 01/12/2011 1204   HCT 39.1 01/12/2011 1204   PLT 195.0 01/12/2011 1204   MCV 91.3 01/12/2011 1204   MCH 31.0 11/15/2010 1845   MCHC 34.8 01/12/2011 1204   RDW 14.2 01/12/2011 1204   LYMPHSABS 1.4 01/12/2011 1204   MONOABS 0.3 01/12/2011 1204   EOSABS 0.1 01/12/2011 1204   BASOSABS 0.0 01/12/2011 1204   No new tests since last visit    Assessment & Plan:   Discussion: 75 year old male former smoker with childhood asthma and now moderate-severe COPD with asthma overlap who presents  for follow-up. Failed Stiolto (not well controlled) and Breztri (intolerance due to thrush event with spacer)  Moderate-severe COPD with asthma overlap (FEV 52%) --START Albuterol as needed for shortness of breath/wheezing or PRIOR to activity --Will order nebulizer  --START DUONEB twice a day as needed. Can use up to four times a day maximum  Health Maintenance Immunization History  Administered Date(s) Administered  . Influenza Inj Mdck Quad Pf 06/02/2018  . Influenza Split 06/29/2008, 07/04/2009, 06/14/2014, 06/25/2014, 05/23/2015, 04/10/2019  . Influenza Whole 05/24/2009, 10/27/2010  . Influenza, High Dose Seasonal PF 07/17/2014, 07/22/2016, 05/28/2017, 06/24/2017, 04/10/2019  . Influenza,inj,Quad PF,6+ Mos 06/06/2015  . PFIZER(Purple Top)SARS-COV-2 Vaccination 10/05/2019, 10/30/2019, 05/20/2020, 12/06/2020  . Pneumococcal Conjugate-13 11/05/2014  . Pneumococcal Polysaccharide-23 08/06/2011, 04/14/2014  . Td 09/01/2005    CT Lung Screen - not qualified  Orders Placed This Encounter  Procedures  . AMB REFERRAL FOR DME    Referral Priority:   Routine    Referral Type:   Durable Medical Equipment Purchase    Number of Visits Requested:   1   Meds ordered this encounter  Medications  . albuterol (VENTOLIN HFA) 108 (90 Base) MCG/ACT inhaler    Sig: Inhale 2 puffs into the lungs every 6 (six) hours as needed for wheezing or shortness of breath.    Dispense:  1 each    Refill:  2  . ipratropium-albuterol (DUONEB) 0.5-2.5 (3) MG/3ML SOLN    Sig: Take 3 mLs by nebulization every 6 (six) hours as needed. TWICE A DAY    Dispense:  360 mL    Refill:  6    Return in about 6 months (around 07/13/2021).   I have spent a total time of 32-minutes on the day of the appointment reviewing prior documentation, coordinating care and discussing medical diagnosis and plan with the patient/family. Imaging, labs and tests included in this note have been reviewed and interpreted independently by  me.   [place], MD [facility] 01/10/2021 12:24 PM  Office Number [phone]

## 2021-01-14 DIAGNOSIS — J449 Chronic obstructive pulmonary disease, unspecified: Secondary | ICD-10-CM | POA: Diagnosis not present

## 2021-01-21 ENCOUNTER — Encounter: Payer: Self-pay | Admitting: Pulmonary Disease

## 2021-02-04 ENCOUNTER — Other Ambulatory Visit: Payer: Self-pay

## 2021-02-04 MED ORDER — IPRATROPIUM-ALBUTEROL 0.5-2.5 (3) MG/3ML IN SOLN: 3.0000 mL | Freq: Four times a day (QID) | RESPIRATORY_TRACT | 2 refills | Status: DC | PRN

## 2021-05-01 DIAGNOSIS — N35014 Post-traumatic urethral stricture, male, unspecified: Secondary | ICD-10-CM | POA: Diagnosis not present

## 2021-05-13 DIAGNOSIS — H401131 Primary open-angle glaucoma, bilateral, mild stage: Secondary | ICD-10-CM | POA: Diagnosis not present

## 2021-05-18 ENCOUNTER — Telehealth: Payer: Self-pay | Admitting: Internal Medicine

## 2021-05-18 NOTE — Telephone Encounter (Signed)
Vax x 4  Onset was 05/16/21 cough, some sob better with saba hfa  No fever, chills n or v   Paxlovid offered but concerned he only has one kidney and no creat > 6 m so wants to wait  Call him 9/26 to see how he's doing and if not well can do bmet and start paxlovid > to er in meantime if needed

## 2021-05-19 NOTE — Telephone Encounter (Signed)
Called and spoke with patient who states that he is feeling much better and is currently not having any symptoms. Advised him that if anything changes or symptoms come back for him to call and let us know. Patient expressed understanding. Nothing further needed at this time.

## 2021-05-22 NOTE — Telephone Encounter (Signed)
Please schedule patient for video visit with me at 10 AM on 05/23/21.

## 2021-05-22 NOTE — Telephone Encounter (Signed)
JE please advise. Thanks  

## 2021-05-23 ENCOUNTER — Other Ambulatory Visit: Payer: Self-pay

## 2021-05-23 ENCOUNTER — Encounter: Payer: Self-pay | Admitting: Pulmonary Disease

## 2021-05-23 ENCOUNTER — Telehealth (INDEPENDENT_AMBULATORY_CARE_PROVIDER_SITE_OTHER): Payer: Medicare Other | Admitting: Pulmonary Disease

## 2021-05-23 DIAGNOSIS — U071 COVID-19: Secondary | ICD-10-CM | POA: Diagnosis not present

## 2021-05-23 DIAGNOSIS — J441 Chronic obstructive pulmonary disease with (acute) exacerbation: Secondary | ICD-10-CM

## 2021-05-23 MED ORDER — PREDNISONE 20 MG PO TABS: 40.0000 mg | ORAL_TABLET | Freq: Every day | ORAL | 0 refills | Status: AC

## 2021-05-23 NOTE — Patient Instructions (Addendum)
COVID-19 URI COPD/Asthma exacerbation --START Mucinex-DM twice a day --START prednisone 40 mg daily x 5 days --START DUONEBS THREE times day --Quarantine between 5-10 days. Ok to leave house with mask if symptoms improve.   Follow-up with me in 3 months

## 2021-05-23 NOTE — Progress Notes (Signed)
Subjective:   PATIENT ID: Carlos Romero GENDER: male DOB: [birth year], MRN: [id number]    Chief Complaint  Patient presents with   Acute Visit    COVID positive. Cough, wheezing, congestion    Reason for Visit: Follow-up  Carlos Romero is a 75 year old male former smoker with COPD, hx childhood asthma, prostate cancer s/p prostatectomy in 1999 and radiation in 2002 and solitary kidney who presents for follow-up.  He reports with the [facility] he had cough with inhaler use so he stopped using it. He has not been on any inhalers. He does have shortness of breath on exertion, cough and wheeze however no limits in activity. He started going back to the gym one month ago four days a week. He reports 45 min of cardio with each session. He rarely uses albuterol. He has lost 25lbs in the last year. Rarely wheezes. Denies ED/urgent care visits  05/23/21 He was diagnosed with COVID nearly one week ago. Reports persistent cough. Minimally productive cough however has chest congestion. Coughing at night with wheezing. He has been using handheld albuterol twice a day with some relief. No recent fever 72 hours.  Social History: Quit smoking 30 years ago.  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) 2003   PUD (peptic ulcer disease) 1967   s/p perf   Pulmonary embolism ([place]) 1967   Solitary lung nodule    RML     Allergies  Allergen Reactions   Breztri Aerosphere [Budeson-Glycopyrrol-Formoterol]  Cough   Fluoxetine Other (See Comments)    Vivid dreams   Fluticasone-Umeclidin-Vilant     Other reaction(s): increased cough   Lansoprazole     Other reaction(s): diarrhea   Morphine     REACTION: "feels crazy"   Other     Other reaction(s): vivid dreams   Venlafaxine     Other reaction(s): Other (See Comments)   Budesonide Other (See Comments)    Thrush   Morphine Sulfate Anxiety   Prozac [Fluoxetine Hcl] Anxiety     Outpatient Medications Prior to Visit  Medication Sig Dispense Refill   albuterol (VENTOLIN HFA) 108 (90 Base) MCG/ACT inhaler Inhale 2 puffs into the lungs every 6 (six) hours as needed for wheezing or shortness of breath. 1 each 2   aspirin EC 81 MG tablet Take 1 tablet (81 mg total) by mouth daily. Swallow whole. 30 tablet 11   atorvastatin (LIPITOR) 40 MG tablet TAKE 1 TABLET BY MOUTH  DAILY 90 tablet 3   chlorpheniramine (CHLOR-TRIMETON) 4 MG tablet Take 4 mg by mouth every 4 (four) hours as needed for allergies.     chlorpheniramine-HYDROcodone (TUSSIONEX) 10-8 MG/5ML SUER TAKE 5 MILLILITERS BY MOUTH TWICE A DAY AS NEEDED 473 mL 0   diphenhydrAMINE (BENADRYL) 25 MG tablet Take 25 mg by mouth at bedtime as needed for allergies.     ipratropium-albuterol (DUONEB) 0.5-2.5 (3) MG/3ML SOLN Take 3 mLs by nebulization every  6 (six) hours as needed. TWICE A DAY 1080 mL 2   latanoprost (XALATAN) 0.005 % ophthalmic solution      loperamide (IMODIUM A-D) 2 MG tablet Take 2 mg by mouth 4 (four) times daily as needed for diarrhea or loose stools.     loratadine (CLARITIN) 10 MG tablet Take 10 mg by mouth daily.     losartan (COZAAR) 25 MG tablet TAKE 1 TABLET (25 MG TOTAL) BY MOUTH DAILY. 90 tablet 2   omeprazole (PRILOSEC) 20 MG capsule Take 20 mg by mouth daily as needed.      pantoprazole (PROTONIX) 40 MG tablet TAKE ONE TABLET BY MOUTH ONCE DAILY 30 TO 60 MINUTES BEFORE FIRST MEAL OF THE DAY 90 tablet 0   Tiotropium Bromide-Olodaterol (STIOLTO RESPIMAT) 2.5-2.5 MCG/ACT  AERS Inhale 2 puffs into the lungs daily. 1 each 6   valACYclovir (VALTREX) 1000 MG tablet 1,000 mg as needed.      No facility-administered medications prior to visit.    Review of Systems  Constitutional:  Negative for chills, diaphoresis, fever, malaise/fatigue and weight loss.  HENT:  Negative for congestion.   Respiratory:  Positive for cough, shortness of breath and wheezing. Negative for hemoptysis and sputum production.   Cardiovascular:  Negative for chest pain, palpitations and leg swelling.    Objective:   There were no vitals filed for this visit.     Physical Exam: General: No acute distress Respiratory: Able to speak full sentences. No audible wheezing  Data Reviewed:  Imaging: CT Chest 07/05/18 - Minimal hazy mosaic attenuation throughout. No pulmonary nodules noted.  PFT: 05/24/20 FVC 2.8 (57%) FEV1 1.9 (52%) Ratio 67  Interpretation: Moderately severe obstructive defect  Labs: CBC    Component Value Date/Time   WBC 4.0 (L) 01/12/2011 1204   RBC 4.28 01/12/2011 1204   HGB 13.6 01/12/2011 1204   HCT 39.1 01/12/2011 1204   PLT 195.0 01/12/2011 1204   MCV 91.3 01/12/2011 1204   MCH 31.0 11/15/2010 1845   MCHC 34.8 01/12/2011 1204   RDW 14.2 01/12/2011 1204   LYMPHSABS 1.4 01/12/2011 1204   MONOABS 0.3 01/12/2011 1204   EOSABS 0.1 01/12/2011 1204   BASOSABS 0.0 01/12/2011 1204   No new tests since last visit    Assessment & Plan:   Discussion: 75 year old male former smoker with childhood asthma and moderate-severe COPD/asthma overlap who presents for follow-up. In exacerbation with recent COVID-19 infection. Failed Stiolto (not well controlled) and Breztri (intolerance due to thrush event with spacer)  COVID-19 URI COPD/Asthma exacerbation --START Mucinex-DM twice a day --START prednisone 40 mg daily x 5 days --START DUONEBS THREE times day --Quarantine between 5-10 days. Ok to leave house with mask if symptoms improve.   Moderate-severe  COPD with asthma overlap (FEV 52%) --Management as above  Health Maintenance Immunization History  Administered Date(s) Administered   Influenza Inj Mdck Quad Pf 06/02/2018   Influenza Split 06/29/2008, 07/04/2009, 06/14/2014, 06/25/2014, 05/23/2015, 04/10/2019   Influenza Whole 05/24/2009, 10/27/2010   Influenza, High Dose Seasonal PF 07/17/2014, 07/22/2016, 05/28/2017, 06/24/2017, 04/10/2019   Influenza,inj,Quad PF,6+ Mos 06/06/2015   PFIZER(Purple Top)SARS-COV-2 Vaccination 10/05/2019, 10/30/2019, 05/20/2020, 12/06/2020   Pneumococcal Conjugate-13 11/05/2014   Pneumococcal Polysaccharide-23 08/06/2011, 04/14/2014   Td 09/01/2005    CT Lung Screen - not qualified  No orders of the defined types were placed in this encounter.  Meds ordered this encounter  Medications   predniSONE (DELTASONE) 20 MG tablet    Sig: Take 2 tablets (40  mg total) by mouth daily with breakfast for 5 days.    Dispense:  10 tablet    Refill:  0    Return in about 3 months (around 08/22/2021).   I have spent a total time of 32-minutes on the day of the appointment reviewing prior documentation, coordinating care and discussing medical diagnosis and plan with the patient/family. Past medical history, allergies, medications were reviewed. Pertinent imaging, labs and tests included in this note have been reviewed and interpreted independently by me.  [place], MD [facility] 05/23/2021 10:07 AM  Office Number [phone]

## 2021-07-08 ENCOUNTER — Telehealth: Payer: Self-pay

## 2021-07-08 NOTE — Telephone Encounter (Signed)
-----   Message from [place], MD sent at 07/05/2021  1:13 PM EST ----- Regarding: Follow-up Please schedule patient for routine follow-up. Recall placed but not scheduled yet. -JE

## 2021-07-08 NOTE — Telephone Encounter (Signed)
Call made to patient, confirmed DOB. Appt made.   Nothing further needed at this time.  

## 2021-07-15 DIAGNOSIS — Z79899 Other long term (current) drug therapy: Secondary | ICD-10-CM | POA: Diagnosis not present

## 2021-07-15 DIAGNOSIS — K219 Gastro-esophageal reflux disease without esophagitis: Secondary | ICD-10-CM | POA: Diagnosis not present

## 2021-07-15 DIAGNOSIS — Z1389 Encounter for screening for other disorder: Secondary | ICD-10-CM | POA: Diagnosis not present

## 2021-07-15 DIAGNOSIS — J449 Chronic obstructive pulmonary disease, unspecified: Secondary | ICD-10-CM | POA: Diagnosis not present

## 2021-07-15 DIAGNOSIS — R7301 Impaired fasting glucose: Secondary | ICD-10-CM | POA: Diagnosis not present

## 2021-07-15 DIAGNOSIS — E7800 Pure hypercholesterolemia, unspecified: Secondary | ICD-10-CM | POA: Diagnosis not present

## 2021-07-15 DIAGNOSIS — I2510 Atherosclerotic heart disease of native coronary artery without angina pectoris: Secondary | ICD-10-CM | POA: Diagnosis not present

## 2021-07-15 DIAGNOSIS — Z0000 Encounter for general adult medical examination without abnormal findings: Secondary | ICD-10-CM | POA: Diagnosis not present

## 2021-07-15 DIAGNOSIS — Z86010 Personal history of colonic polyps: Secondary | ICD-10-CM | POA: Diagnosis not present

## 2021-07-20 NOTE — Progress Notes (Signed)
Cardiology Office Note:   Date:  07/21/2021  NAME:  Carlos Romero    MRN: [id number] DOB:  Aug 13, 1946   PCP:  Gaynelle Arabian, MD  Cardiologist:  None  Electrophysiologist:  None   Referring MD: Gaynelle Arabian, MD   Chief Complaint  Patient presents with   Follow-up    12 months.    History of Present Illness:   Carlos Romero is a 75 y.o. male with a hx of COPD, coronary calcifications, HLD who presents for follow-up.  He reports he is doing well.  His EKG demonstrates PVCs.  He has no symptoms of this.  He describes no palpitations.  He reports no chest pain or trouble breathing.  No structured exercise but he does climb stairs in his house several times per day.  No limitations with this.  His weight is down roughly 20 pounds since last year.  He is working on dieting more.  He is also working with pulmonary.  He does enjoy his visits with his pulmonologist.  Things seem to be going well.  No significant chest pain or trouble breathing reported.  Most recent LDL cholesterol 62.  He is on Lipitor 40 mg daily.  He is on aspirin.  No bleeding issues.  BP 126/84.  Well-controlled on losartan.  We discussed grapefruit juice.  He apparently can eat one half grapefruit per day without any issues on Lipitor.  If he consumes more we should consider switching him to Crestor.  We discussed this in the office today.  No changes to medications are recommended.  Problem List 1. Moderate COPD -20-25-pack-year history 2.  Hyperlipidemia -Total cholesterol 140, HDL 55, LDL 62, triglycerides 132 3. Obesity -BMI 33 4. CAD -coronary calcifications in RCA on CT chest 2019 -Exercise SPECT: Reduced counts in the inferior segments with normal wall motion consistent with diaphragm attenuation, no ischemia  Past Medical History: Past Medical History:  Diagnosis Date   Barrett's esophagus    Chronic cough    onset Oct 2010, better after prednisone short course 12-2009, sinus CT 11-06-10  bilateral maxillary, ethmoid and possibly frontal sinusitis>21 days augmentin   COPD (chronic obstructive pulmonary disease) (HCC)    gold stage 2, PFTs 06-12-09 FEV1 2.25 (65%) ratio 62 and no better with B2   Depression 1999   GERD (gastroesophageal reflux disease)    Hyperlipidemia    Lung disease    LLL air space disease   Prostate cancer ([place]) 2003   PUD (peptic ulcer disease) 1967   s/p perf   Pulmonary embolism ([place]) 1967   Solitary lung nodule    RML    Past Surgical History: Past Surgical History:  Procedure Laterality Date   PROSTATECTOMY  1999    Current Medications: Current Meds  Medication Sig   albuterol (VENTOLIN HFA) 108 (90 Base) MCG/ACT inhaler Inhale 2 puffs into the lungs every 6 (six) hours as needed for wheezing or shortness of breath.   aspirin EC 81 MG tablet Take 1 tablet (81 mg total) by mouth daily. Swallow whole.   atorvastatin (LIPITOR) 40 MG tablet TAKE 1 TABLET BY MOUTH  DAILY   chlorpheniramine (CHLOR-TRIMETON) 4 MG tablet Take 4 mg by mouth every 4 (four) hours as needed for allergies.   chlorpheniramine-HYDROcodone (TUSSIONEX) 10-8 MG/5ML SUER TAKE 5 MILLILITERS BY MOUTH TWICE A DAY AS NEEDED   diphenhydrAMINE (BENADRYL) 25 MG tablet Take 25 mg by mouth at bedtime as needed for allergies.   ipratropium-albuterol (DUONEB) 0.5-2.5 (3) MG/3ML  SOLN Take 3 mLs by nebulization every 6 (six) hours as needed. TWICE A DAY   latanoprost (XALATAN) 0.005 % ophthalmic solution    loperamide (IMODIUM A-D) 2 MG tablet Take 2 mg by mouth 4 (four) times daily as needed for diarrhea or loose stools.   loratadine (CLARITIN) 10 MG tablet Take 10 mg by mouth daily.   losartan (COZAAR) 25 MG tablet TAKE 1 TABLET (25 MG TOTAL) BY MOUTH DAILY.   omeprazole (PRILOSEC) 20 MG capsule Take 20 mg by mouth daily as needed.    pantoprazole (PROTONIX) 40 MG tablet TAKE ONE TABLET BY MOUTH ONCE DAILY 30 TO 60 MINUTES BEFORE FIRST MEAL OF THE DAY   valACYclovir (VALTREX) 1000 MG  tablet 1,000 mg as needed.      Allergies:    Breztri aerosphere [budeson-glycopyrrol-formoterol], Fluoxetine, Fluticasone-umeclidin-vilant, Lansoprazole, Morphine, Other, Venlafaxine, Budesonide, Morphine sulfate, and Prozac [fluoxetine hcl]   Social History: Social History   Socioeconomic History   Marital status: Married    Spouse name: Not on file   Number of children: Not on file   Years of education: Not on file   Highest education level: Not on file  Occupational History   Occupation: Retail buyer: Retired  Tobacco Use   Smoking status: Former    Packs/day: 1.00    Years: 25.00    Pack years: 25.00    Types: Cigarettes    Start date: 08/1960    Quit date: 08/24/1986    Years since quitting: 34.9   Smokeless tobacco: Never  Vaping Use   Vaping Use: Never used  Substance and Sexual Activity   Alcohol use: Yes    Alcohol/week: 0.0 standard drinks    Comment: beer once in a while    Drug use: Never   Sexual activity: Not on file  Other Topics Concern   Not on file  Social History Narrative   Not on file   Social Determinants of Health   Financial Resource Strain: Not on file  Food Insecurity: Not on file  Transportation Needs: Not on file  Physical Activity: Not on file  Stress: Not on file  Social Connections: Not on file    Family History: The patient's family history includes Asthma in his mother; Cancer in his mother; Emphysema in an other family member; Stroke in his mother.  ROS:   All other ROS reviewed and negative. Pertinent positives noted in the HPI.     EKGs/Labs/Other Studies Reviewed:   The following studies were personally reviewed by me today:  EKG:  EKG is ordered today.  The ekg ordered today demonstrates normal sinus rhythm heart rate 79, PVCs noted, and was personally reviewed by me.   TTE 04/22/2020  1. Technically difficult study. Left ventricular ejection fraction, by  estimation, is 60 to 65%. The left ventricle has  grossly normal function.  Left ventricular endocardial border not optimally defined to evaluate  regional wall motion. There is mild  left ventricular hypertrophy. Left ventricular diastolic parameters are  consistent with Grade I diastolic dysfunction (impaired relaxation).   2. Right ventricular systolic function is normal. The right ventricular  size is mildly enlarged. Tricuspid regurgitation signal is inadequate for  assessing PA pressure.   3. The mitral valve is normal in structure. No evidence of mitral valve  regurgitation.   4. The aortic valve was not well visualized. Aortic valve regurgitation  is not visualized. No aortic stenosis is present.   5. There is mild dilatation  of the ascending aorta measuring 37 mm.   6. The inferior vena cava is normal in size with greater than 50%  respiratory variability, suggesting right atrial pressure of 3 mmHg.   Recent Labs: No results found for requested labs within last 8760 hours.   Recent Lipid Panel    Component Value Date/Time   CHOL 191 04/05/2020 0957   TRIG 163 (H) 04/05/2020 0957   HDL 49 04/05/2020 0957   CHOLHDL 3.9 04/05/2020 0957   LDLCALC 113 (H) 04/05/2020 0957   Physical Exam:   VS:  BP 126/84 (BP Location: Right Arm, Patient Position: Sitting, Cuff Size: Normal)   Pulse 79   Ht 6\' 2"  (1.88 m)   Wt 259 lb (117.5 kg)   BMI 33.25 kg/m    Wt Readings from Last 3 Encounters:  07/21/21 259 lb (117.5 kg)  01/10/21 253 lb (114.8 kg)  09/17/20 262 lb 4 oz (119 kg)    General: Well nourished, well developed, in no acute distress Head: Atraumatic, normal size  Eyes: PEERLA, EOMI  Neck: Supple, no JVD Endocrine: No thryomegaly Cardiac: Normal S1, S2; RRR; no murmurs, rubs, or gallops Lungs: Clear to auscultation bilaterally, no wheezing, rhonchi or rales  Abd: Soft, nontender, no hepatomegaly  Ext: No edema, pulses 2+ Musculoskeletal: No deformities, BUE and BLE strength normal and equal Skin: Warm and dry, no  rashes   Neuro: Alert and oriented to person, place, time, and situation, CNII-XII grossly intact, no focal deficits  Psych: Normal mood and affect   ASSESSMENT:   Carlos Romero is a 75 y.o. male who presents for the following: 1. Mixed hyperlipidemia   2. Coronary artery calcification seen on computed tomography   3. PVC (premature ventricular contraction)     PLAN:   1. Mixed hyperlipidemia 2. Coronary artery calcification seen on computed tomography -RCA calcium performed on prior CT chest. Seen for SOB last year. NM stress normal. Symptoms improved with treatment of COPD and weight loss.  -no symptoms of CP/SOB. -Overall doing well.  Encouraged to exercise and diet moving forward.  Also encouraged to continue aspirin and statin therapy.  We discussed that half a grapefruit is okay on Lipitor.  If he wants to consume more grapefruit he can switch to Crestor.  For now he would like to continue Lipitor.  His most recent LDL cholesterol is 62 which is at goal.  Would recommend he continue this moving forward.  3. PVC (premature ventricular contraction) -PVCs captured on EKG.  No symptoms of this.  No symptoms of congestive heart failure.  No palpitations.  Recent lab work is really unremarkable.  We discussed to keep an eye on things.  I suspect this is benign.  Echocardiogram last year was normal.  Stress test was normal as well.  No real symptoms from this.  No concerns for underlying CAD.  We will monitor this conservatively.  Disposition: Return in about 1 year (around 07/21/2022).  Medication Adjustments/Labs and Tests Ordered: Current medicines are reviewed at length with the patient today.  Concerns regarding medicines are outlined above.  Orders Placed This Encounter  Procedures   EKG 12-Lead    No orders of the defined types were placed in this encounter.   Patient Instructions  Medication Instructions:  The current medical regimen is effective;  continue present plan  and medications.  *If you need a refill on your cardiac medications before your next appointment, please call your pharmacy*   Follow-Up: At [facility],  you and your health needs are our priority.  As part of our continuing mission to provide you with exceptional heart care, we have created designated Provider Care Teams.  These Care Teams include your primary Cardiologist (physician) and Advanced Practice Providers (APPs -  Physician Assistants and Nurse Practitioners) who all work together to provide you with the care you need, when you need it.  We recommend signing up for the patient portal called "MyChart".  Sign up information is provided on this After Visit Summary.  MyChart is used to connect with patients for Virtual Visits (Telemedicine).  Patients are able to view lab/test results, encounter notes, upcoming appointments, etc.  Non-urgent messages can be sent to your provider as well.   To learn more about what you can do with MyChart, go to [email or web address].    Your next appointment:   12 month(s)  The format for your next appointment:   In Person  Provider:   Sande Rives, PA-C or Almyra Deforest, PA-C or Eleonore Chiquito, MD   Time Spent with Patient: I have spent a total of 25 minutes with patient reviewing hospital notes, telemetry, EKGs, labs and examining the patient as well as establishing an assessment and plan that was discussed with the patient.  > 50% of time was spent in direct patient care.  Signed, Addison Naegeli. Audie Box, [place], [place]  [address] [place], [place] [address] [phone]  07/21/2021 10:21 AM

## 2021-07-21 ENCOUNTER — Ambulatory Visit: Payer: Medicare Other | Admitting: Cardiovascular Disease

## 2021-07-21 ENCOUNTER — Other Ambulatory Visit: Payer: Self-pay

## 2021-07-21 ENCOUNTER — Encounter: Payer: Self-pay | Admitting: Cardiovascular Disease

## 2021-07-21 VITALS — BP 126/84 | HR 79 | Ht 74.0 in | Wt 259.0 lb

## 2021-07-21 DIAGNOSIS — I493 Ventricular premature depolarization: Secondary | ICD-10-CM | POA: Diagnosis not present

## 2021-07-21 DIAGNOSIS — E782 Mixed hyperlipidemia: Secondary | ICD-10-CM

## 2021-07-21 DIAGNOSIS — I2510 Atherosclerotic heart disease of native coronary artery without angina pectoris: Secondary | ICD-10-CM

## 2021-07-21 NOTE — Patient Instructions (Signed)
Medication Instructions:  The current medical regimen is effective;  continue present plan and medications.  *If you need a refill on your cardiac medications before your next appointment, please call your pharmacy*   Follow-Up: At [facility], you and your health needs are our priority.  As part of our continuing mission to provide you with exceptional heart care, we have created designated Provider Care Teams.  These Care Teams include your primary Cardiologist (physician) and Advanced Practice Providers (APPs -  Physician Assistants and Nurse Practitioners) who all work together to provide you with the care you need, when you need it.  We recommend signing up for the patient portal called "MyChart".  Sign up information is provided on this After Visit Summary.  MyChart is used to connect with patients for Virtual Visits (Telemedicine).  Patients are able to view lab/test results, encounter notes, upcoming appointments, etc.  Non-urgent messages can be sent to your provider as well.   To learn more about what you can do with MyChart, go to [email or web address].    Your next appointment:   12 month(s)  The format for your next appointment:   In Person  Provider:   Sande Rives, PA-C or Almyra Deforest, PA-C or Eleonore Chiquito, MD

## 2021-07-24 ENCOUNTER — Other Ambulatory Visit: Payer: Self-pay

## 2021-07-24 ENCOUNTER — Ambulatory Visit: Payer: Medicare Other | Admitting: Pulmonary Disease

## 2021-07-24 ENCOUNTER — Encounter: Payer: Self-pay | Admitting: Pulmonary Disease

## 2021-07-24 VITALS — BP 130/86 | HR 77 | Temp 98.6°F | Ht 73.0 in | Wt 259.4 lb

## 2021-07-24 DIAGNOSIS — J449 Chronic obstructive pulmonary disease, unspecified: Secondary | ICD-10-CM | POA: Diagnosis not present

## 2021-07-24 DIAGNOSIS — U099 Post covid-19 condition, unspecified: Secondary | ICD-10-CM | POA: Diagnosis not present

## 2021-07-24 DIAGNOSIS — R0609 Other forms of dyspnea: Secondary | ICD-10-CM | POA: Diagnosis not present

## 2021-07-24 MED ORDER — HYDROCOD POLST-CPM POLST ER 10-8 MG/5ML PO SUER: ORAL | 0 refills | Status: DC

## 2021-07-24 MED ORDER — IPRATROPIUM-ALBUTEROL 0.5-2.5 (3) MG/3ML IN SOLN: 3.0000 mL | Freq: Four times a day (QID) | RESPIRATORY_TRACT | 2 refills | Status: AC | PRN

## 2021-07-24 NOTE — Patient Instructions (Addendum)
COPD/Asthma overlap COVID-19 long hauler with dyspnea --CONTINUE Duonebs THREE times a day. REFILLED --CONTINUE cough syrup as needed. REFILLED --Encourage regular activity. Consider Pulmonary Rehab in the future if needed  Nasal congestions --RESTART claritin --Consider nasal spray 1 spray as needed daily. AVOID Afrin  Follow-up with me in 4 months

## 2021-07-24 NOTE — Progress Notes (Signed)
Subjective:   PATIENT ID: Carlos Romero GENDER: male DOB: Jan 10, 1946, MRN: [id number]    Chief Complaint  Patient presents with   Follow-up    Copd, no new updates   Reason for Visit: Follow-up  [name] is a 75 year old male former smoker with COPD, hx childhood asthma, prostate cancer s/p prostatectomy in 1999 and radiation in 2002 and solitary kidney who presents for follow-up.  He reports with the [facility] he had cough with inhaler use so he stopped using it. He has not been on any inhalers. He does have shortness of breath on exertion, cough and wheeze however no limits in activity. He started going back to the gym one month ago four days a week. He reports 45 min of cardio with each session. He rarely uses albuterol. He has lost 25lbs in the last year. Rarely wheezes. Denies ED/urgent care visits  05/23/21 He was diagnosed with COVID nearly one week ago. Reports persistent cough. Minimally productive cough however has chest congestion. Coughing at night with wheezing. He has been using handheld albuterol twice a day with some relief. No recent fever 72 hours.  07/24/21 Since his COVID diagnosis, he reports that he has decreased stamina and shortness of breath with activity. He previously went the the gym 4-5 times a week, using treadmill and machines. Walking upstairs is difficult. Uses albuterol once a week. Minimal cough usually during the night. Uses cough syrup twice a week.   Social History: Quit smoking 30 years ago.  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  Past Medical History:  Diagnosis Date   Barrett's esophagus    Chronic cough    onset Oct 2010, better after prednisone short course 12-2009, sinus CT 11-06-10 bilateral maxillary, ethmoid and possibly frontal sinusitis>21 days augmentin   COPD (chronic obstructive pulmonary disease) (HCC)    gold stage 2, PFTs 06-12-09 FEV1 2.25 (65%) ratio 62 and no better with B2   Depression 1999   GERD (gastroesophageal reflux  disease)    Hyperlipidemia    Lung disease    LLL air space disease   Prostate cancer ([place]) 2003   PUD (peptic ulcer disease) 1967   s/p perf   Pulmonary embolism ([place]) 1967   Solitary lung nodule    RML     Allergies  Allergen Reactions   Breztri Aerosphere [Budeson-Glycopyrrol-Formoterol] Cough   Fluoxetine Other (See Comments)    Vivid dreams   Fluticasone-Umeclidin-Vilant     Other reaction(s): increased cough   Lansoprazole     Other reaction(s): diarrhea   Morphine     REACTION: "feels crazy"   Other     Other reaction(s): vivid dreams   Venlafaxine     Other reaction(s): Other (See Comments)   Budesonide Other (See Comments)    Thrush   Morphine Sulfate Anxiety   Prozac [Fluoxetine Hcl] Anxiety     Outpatient Medications Prior to Visit  Medication Sig Dispense Refill   albuterol (VENTOLIN HFA) 108 (90 Base) MCG/ACT inhaler Inhale 2 puffs into the lungs every 6 (six) hours as needed for wheezing or shortness of breath. 1 each 2   aspirin EC 81 MG tablet Take 1 tablet (81 mg total) by mouth daily. Swallow whole. 30 tablet 11   atorvastatin (LIPITOR) 40 MG tablet TAKE 1 TABLET BY MOUTH  DAILY 90 tablet 3   chlorpheniramine-HYDROcodone (TUSSIONEX) 10-8 MG/5ML SUER TAKE 5 MILLILITERS BY MOUTH TWICE A DAY AS NEEDED 473 mL 0   diphenhydrAMINE (BENADRYL)  25 MG tablet Take 25 mg by mouth at bedtime as needed for allergies.     ipratropium-albuterol (DUONEB) 0.5-2.5 (3) MG/3ML SOLN Take 3 mLs by nebulization every 6 (six) hours as needed. TWICE A DAY 1080 mL 2   latanoprost (XALATAN) 0.005 % ophthalmic solution      loperamide (IMODIUM A-D) 2 MG tablet Take 2 mg by mouth 4 (four) times daily as needed for diarrhea or loose stools.     loratadine (CLARITIN) 10 MG tablet Take 10 mg by mouth daily.     losartan (COZAAR) 25 MG tablet TAKE 1 TABLET (25 MG TOTAL) BY MOUTH DAILY. 90 tablet 2   omeprazole (PRILOSEC) 20 MG capsule Take 20 mg by mouth daily as needed.       pantoprazole (PROTONIX) 40 MG tablet TAKE ONE TABLET BY MOUTH ONCE DAILY 30 TO 60 MINUTES BEFORE FIRST MEAL OF THE DAY 90 tablet 0   valACYclovir (VALTREX) 1000 MG tablet 1,000 mg as needed.      chlorpheniramine (CHLOR-TRIMETON) 4 MG tablet Take 4 mg by mouth every 4 (four) hours as needed for allergies.     No facility-administered medications prior to visit.    Review of Systems  Constitutional:  Positive for malaise/fatigue. Negative for chills, diaphoresis, fever and weight loss.  HENT:  Negative for congestion.   Respiratory:  Positive for cough and shortness of breath. Negative for hemoptysis, sputum production and wheezing.   Cardiovascular:  Negative for chest pain, palpitations and leg swelling.    Objective:   Vitals:   07/24/21 0914  BP: 130/86  Pulse: 77  Temp: 98.6 F (37 C)  TempSrc: Oral  SpO2: 96%  Weight: 259 lb 6.4 oz (117.7 kg)  Height: 6\' 1"  (1.854 m)    SpO2: 96 % O2 Device: None (Room air)  Physical Exam: General: Well-appearing, no acute distress HENT: , AT Eyes: EOMI, no scleral icterus Respiratory: Clear to auscultation bilaterally.  No crackles, wheezing or rales Cardiovascular: RRR, -M/R/G, no JVD Extremities:-Edema,-tenderness Neuro: AAO x4, CNII-XII grossly intact Psych: Normal mood, normal affect  Data Reviewed:  Imaging: CT Chest 07/05/18 - Minimal hazy mosaic attenuation throughout. No pulmonary nodules noted.  PFT: 05/24/20 FVC 2.8 (57%) FEV1 1.9 (52%) Ratio 67  Interpretation: Moderately severe obstructive defect  Labs: CBC    Component Value Date/Time   WBC 4.0 (L) 01/12/2011 1204   RBC 4.28 01/12/2011 1204   HGB 13.6 01/12/2011 1204   HCT 39.1 01/12/2011 1204   PLT 195.0 01/12/2011 1204   MCV 91.3 01/12/2011 1204   MCH 31.0 11/15/2010 1845   MCHC 34.8 01/12/2011 1204   RDW 14.2 01/12/2011 1204   LYMPHSABS 1.4 01/12/2011 1204   MONOABS 0.3 01/12/2011 1204   EOSABS 0.1 01/12/2011 1204   BASOSABS 0.0 01/12/2011 1204    Abs eos 01/12/11 - 100    Assessment & Plan:   Discussion: 75 year old male former smoker with childhood asthma and moderate-severe COPD/asthma overlap, hx PE, hx prostate cancer who presents for follow-up. In exacerbation with recent COVID-19 infection. Failed Stiolto (not well controlled) and Breztri (intolerance due to thrush event with spacer)  75 year old male former smoker with childhood asthma and moderate-severe COPD/asthma overlap who presents for follow-up. Has had persistent symptoms of dyspnea since COVID-19 infection since 04/2021. Failed Stiolto (not well controlled) and Breztri (intolerance due to thrush event with spacer)  Moderate-severe COPD with asthma overlap (FEV 52%) COVID-19 long hauler with dyspnea --CONTINUE Duonebs THREE times a day.  REFILLED --CONTINUE cough syrup as needed. REFILLED --Encourage regular activity. Consider Pulmonary Rehab in the future if needed  Nasal congestion --RESTART claritin --Consider nasal spray 1 spray as needed daily. AVOID Afrin  Health Maintenance Immunization History  Administered Date(s) Administered   Influenza Inj Mdck Quad Pf 06/02/2018   Influenza Split 06/29/2008, 07/04/2009, 06/14/2014, 06/25/2014, 05/23/2015, 04/10/2019, 07/08/2021   Influenza Whole 05/24/2009, 10/27/2010   Influenza, High Dose Seasonal PF 07/17/2014, 07/22/2016, 05/28/2017, 06/24/2017, 04/10/2019, 07/03/2021   Influenza,inj,Quad PF,6+ Mos 06/06/2015   PFIZER(Purple Top)SARS-COV-2 Vaccination 10/05/2019, 10/30/2019, 05/20/2020, 12/06/2020   Pneumococcal Conjugate-13 11/05/2014   Pneumococcal Polysaccharide-23 08/06/2011, 04/14/2014   Td 09/01/2005    CT Lung Screen - not qualified  No orders of the defined types were placed in this encounter.  Meds ordered this encounter  Medications   ipratropium-albuterol (DUONEB) 0.5-2.5 (3) MG/3ML SOLN    Sig: Take 3 mLs by nebulization every 6 (six) hours as needed. TWICE A DAY    Dispense:  1080 mL     Refill:  2   chlorpheniramine-HYDROcodone (TUSSIONEX) 10-8 MG/5ML SUER    Sig: TAKE 5 MILLILITERS BY MOUTH TWICE A DAY AS NEEDED    Dispense:  473 mL    Refill:  0    Return in about 4 months (around 11/22/2021).   I have spent a total time of 36-minutes on the day of the appointment reviewing prior documentation, coordinating care and discussing medical diagnosis and plan with the patient/family. Past medical history, allergies, medications were reviewed. Pertinent imaging, labs and tests included in this note have been reviewed and interpreted independently by me.  [place], MD [facility] 07/24/2021 9:19 AM  Office Number [phone]

## 2021-07-27 DIAGNOSIS — J449 Chronic obstructive pulmonary disease, unspecified: Secondary | ICD-10-CM | POA: Insufficient documentation

## 2021-07-29 ENCOUNTER — Encounter: Payer: Self-pay | Admitting: Pulmonary Disease

## 2021-07-29 NOTE — Telephone Encounter (Signed)
Received the following message from patient:  "I've used my nebulizer twice(2) times daily since my visit last Thursday.  My cough and wheezing have gotten progressively worse.  Any suggestions?   Regards, Carlos Romero"  I asked the patient if he had noticed any color to the phlegm or if he has had any fevers. He stated no to both questions.   Dr. Loanne Drilling, can you please advise? Thanks!

## 2021-07-30 MED ORDER — PREDNISONE 10 MG PO TABS: ORAL_TABLET | ORAL | 0 refills | Status: DC

## 2021-08-15 ENCOUNTER — Emergency Department (HOSPITAL_COMMUNITY): Payer: Medicare Other

## 2021-08-15 ENCOUNTER — Emergency Department (HOSPITAL_COMMUNITY)
Admission: EM | Admit: 2021-08-15 | Discharge: 2021-08-15 | Disposition: A | Discharge status: Home / Self Care | Payer: Medicare Other | Attending: Emergency Medicine | Admitting: Emergency Medicine

## 2021-08-15 DIAGNOSIS — J45909 Unspecified asthma, uncomplicated: Secondary | ICD-10-CM | POA: Insufficient documentation

## 2021-08-15 DIAGNOSIS — Z87891 Personal history of nicotine dependence: Secondary | ICD-10-CM | POA: Diagnosis not present

## 2021-08-15 DIAGNOSIS — J449 Chronic obstructive pulmonary disease, unspecified: Secondary | ICD-10-CM | POA: Insufficient documentation

## 2021-08-15 DIAGNOSIS — M25572 Pain in left ankle and joints of left foot: Secondary | ICD-10-CM

## 2021-08-15 DIAGNOSIS — S9304XA Dislocation of right ankle joint, initial encounter: Secondary | ICD-10-CM | POA: Diagnosis not present

## 2021-08-15 DIAGNOSIS — M25571 Pain in right ankle and joints of right foot: Secondary | ICD-10-CM | POA: Diagnosis not present

## 2021-08-15 DIAGNOSIS — S99911A Unspecified injury of right ankle, initial encounter: Secondary | ICD-10-CM | POA: Diagnosis present

## 2021-08-15 DIAGNOSIS — S8261XA Displaced fracture of lateral malleolus of right fibula, initial encounter for closed fracture: Secondary | ICD-10-CM | POA: Insufficient documentation

## 2021-08-15 DIAGNOSIS — M7989 Other specified soft tissue disorders: Secondary | ICD-10-CM | POA: Diagnosis not present

## 2021-08-15 DIAGNOSIS — Z8546 Personal history of malignant neoplasm of prostate: Secondary | ICD-10-CM | POA: Diagnosis not present

## 2021-08-15 DIAGNOSIS — R6889 Other general symptoms and signs: Secondary | ICD-10-CM | POA: Diagnosis not present

## 2021-08-15 DIAGNOSIS — Z8616 Personal history of COVID-19: Secondary | ICD-10-CM | POA: Diagnosis not present

## 2021-08-15 DIAGNOSIS — R609 Edema, unspecified: Secondary | ICD-10-CM | POA: Diagnosis not present

## 2021-08-15 DIAGNOSIS — Z7982 Long term (current) use of aspirin: Secondary | ICD-10-CM | POA: Diagnosis not present

## 2021-08-15 DIAGNOSIS — R52 Pain, unspecified: Secondary | ICD-10-CM | POA: Diagnosis not present

## 2021-08-15 DIAGNOSIS — Z743 Need for continuous supervision: Secondary | ICD-10-CM | POA: Diagnosis not present

## 2021-08-15 DIAGNOSIS — X500XXA Overexertion from strenuous movement or load, initial encounter: Secondary | ICD-10-CM | POA: Diagnosis not present

## 2021-08-15 DIAGNOSIS — M25579 Pain in unspecified ankle and joints of unspecified foot: Secondary | ICD-10-CM

## 2021-08-15 DIAGNOSIS — I499 Cardiac arrhythmia, unspecified: Secondary | ICD-10-CM | POA: Diagnosis not present

## 2021-08-15 DIAGNOSIS — S8261XD Displaced fracture of lateral malleolus of right fibula, subsequent encounter for closed fracture with routine healing: Secondary | ICD-10-CM | POA: Diagnosis not present

## 2021-08-15 MED ORDER — ETOMIDATE 2 MG/ML IV SOLN
8.0000 mg | Freq: Once | INTRAVENOUS | Status: DC
  Filled 2021-08-15: qty 10

## 2021-08-15 MED ORDER — ONDANSETRON 8 MG PO TBDP
8.0000 mg | ORAL_TABLET | Freq: Once | ORAL | Status: AC
  Administered 2021-08-15: 16:00:00 8 mg via ORAL
  Filled 2021-08-15: qty 1

## 2021-08-15 MED ORDER — OXYCODONE HCL 5 MG PO TABS
5.0000 mg | ORAL_TABLET | Freq: Once | ORAL | Status: AC
  Administered 2021-08-15: 16:00:00 5 mg via ORAL
  Filled 2021-08-15: qty 1

## 2021-08-15 MED ORDER — OXYCODONE-ACETAMINOPHEN 5-325 MG PO TABS: 1.0000 | ORAL_TABLET | Freq: Four times a day (QID) | ORAL | 0 refills | Status: DC | PRN

## 2021-08-15 MED ORDER — ACETAMINOPHEN 500 MG PO TABS
1000.0000 mg | ORAL_TABLET | Freq: Once | ORAL | Status: AC
  Administered 2021-08-15: 15:00:00 1000 mg via ORAL
  Filled 2021-08-15: qty 2

## 2021-08-15 MED ORDER — ETOMIDATE 2 MG/ML IV SOLN
INTRAVENOUS | Status: AC | PRN
  Administered 2021-08-15: 8 mg via INTRAVENOUS

## 2021-08-15 NOTE — Sedation Documentation (Signed)
X-ray at bedside

## 2021-08-15 NOTE — Discharge Instructions (Addendum)
You have fractures to the right ankle.  You will need to follow-up with orthopedics, information provided above.  Please give them a call Monday morning.  Do not bear wait on the ankle. Take tylenol for pain. For breakthrough pain and severe pain take the percocet sparingly as needed.

## 2021-08-15 NOTE — ED Notes (Signed)
Pt d/c home per MD order. Discharge summary reviewed pt and spouse verbalize understanding. Pt spouse is discharge ride home. Off unit via WC. No s/s of acute distress noted at discharge.

## 2021-08-15 NOTE — ED Provider Notes (Signed)
Medical screening examination/treatment/procedure(s) were conducted as a shared visit with non-physician practitioner(s) and myself.  I personally evaluated the patient during the encounter.  Patient presents emergency department with fall.  He has a right ankle fracture/dislocation.  No evidence of open fracture.  Obtained consent and performed procedure as below.   Reduction of fracture  Date/Time: 08/15/2021 5:03 PM Performed by: Margette Fast, MD Authorized by: Margette Fast, MD  Consent: Written consent obtained. Risks and benefits: risks, benefits and alternatives were discussed Consent given by: patient Required items: required blood products, implants, devices, and special equipment available Patient identity confirmed: verbally with patient and arm band Time out: Immediately prior to procedure a "time out" was called to verify the correct patient, procedure, equipment, support staff and site/side marked as required. Local anesthesia used: no  Anesthesia: Local anesthesia used: no  Sedation: Patient sedated: yes Sedation type: (deep) Sedatives: etomidate Vitals: Vital signs were monitored during sedation.  Patient tolerance: patient tolerated the procedure well with no immediate complications Comments: After adequate sedation, the ankle was reduced anterior and medial.  Postreduction films obtained and splint placed.    .Sedation  Date/Time: 08/15/2021 10:11 PM Performed by: Margette Fast, MD Authorized by: Margette Fast, MD   Consent:    Consent obtained:  Written   Consent given by:  Patient   Risks discussed:  Allergic reaction, dysrhythmia, inadequate sedation, nausea, vomiting, respiratory compromise necessitating ventilatory assistance and intubation, prolonged sedation necessitating reversal and prolonged hypoxia resulting in organ damage Universal protocol:    Immediately prior to procedure, a time out was called: yes     Patient identity confirmed:  Arm  band and verbally with patient Indications:    Procedure performed:  Fracture reduction Pre-sedation assessment:    Time since last food or drink:  4 hours   ASA classification: class 2 - patient with mild systemic disease     Mallampati score:  I - soft palate, uvula, fauces, pillars visible   Pre-sedation assessments completed and reviewed: airway patency, cardiovascular function, hydration status, mental status, nausea/vomiting, pain level, respiratory function and temperature   Immediate pre-procedure details:    Reassessment: Patient reassessed immediately prior to procedure     Reviewed: vital signs, relevant labs/tests and NPO status     Verified: bag valve mask available, emergency equipment available, intubation equipment available, IV patency confirmed, oxygen available and suction available   Procedure details (see MAR for exact dosages):    Preoxygenation:  Nasal cannula   Sedation:  Etomidate   Intended level of sedation: deep   Intra-procedure monitoring:  Blood pressure monitoring, cardiac monitor, continuous pulse oximetry, continuous capnometry, frequent LOC assessments and frequent vital sign checks   Intra-procedure events: none     Total Provider sedation time (minutes):  20 Post-procedure details:    Attendance: Constant attendance by certified staff until patient recovered     Recovery: Patient returned to pre-procedure baseline     Post-sedation assessments completed and reviewed: airway patency, cardiovascular function, hydration status, mental status, nausea/vomiting, pain level, respiratory function and temperature     Patient is stable for discharge or admission: yes     Procedure completion:  Tolerated well, no immediate complications   Nanda Quinton, MD Emergency Medicine    , [place], MD 08/15/21 2212

## 2021-08-15 NOTE — ED Notes (Signed)
Splint being applied

## 2021-08-15 NOTE — ED Provider Notes (Signed)
[facility] EMERGENCY DEPARTMENT Provider Note   CSN: [id number] Arrival date & time: 08/15/21  1421     History Chief Complaint  Patient presents with   Fall   Leg Swelling    Right ankle    Carlos Romero is a 75 y.o. male.   Fall  Patient presents due to ankle injury.  This happened acutely when he was walking on the stairs, he had a mechanical fall where his ankle twisted.  He denies hitting his head or neck, no pain other than on the ankle.  No loss of consciousness, no dizziness, no chest pain or shortness of breath.  He has been unable to bear weight on his ankle secondary to pain.  Has not had anything yet for the pain, not ambulating is an alleviating factor.  Walking is an aggravating factor.  Past Medical History:  Diagnosis Date   Barrett's esophagus    Chronic cough    onset Oct 2010, better after prednisone short course 12-2009, sinus CT 11-06-10 bilateral maxillary, ethmoid and possibly frontal sinusitis>21 days augmentin   COPD (chronic obstructive pulmonary disease) (HCC)    gold stage 2, PFTs 06-12-09 FEV1 2.25 (65%) ratio 62 and no better with B2   Depression 1999   GERD (gastroesophageal reflux disease)    Hyperlipidemia    Lung disease    LLL air space disease   Prostate cancer ([place]) 2003   PUD (peptic ulcer disease) 1967   s/p perf   Pulmonary embolism ([place]) 1967   Solitary lung nodule    RML    Patient Active Problem List   Diagnosis Date Noted   COPD with asthma ([place]) 07/27/2021   COVID-19 long hauler manifesting chronic dyspnea 07/24/2021   Atherosclerotic heart disease of native coronary artery without angina pectoris 01/10/2021   Diverticulosis of colon 01/10/2021   Dysphagia 01/10/2021   History of adenomatous polyp of colon 01/10/2021   History of malignant neoplasm of prostate 01/10/2021   Impingement syndrome of left shoulder region 07/04/2018   Polyp of maxillary sinus 02/07/2018   Chronic rhinitis 10/04/2016   Deviated septum  10/04/2016   Nasal turbinate hypertrophy 10/04/2016   Balanitis 10/14/2015   Obesity (BMI 30-39.9) 04/16/2015   Elevated prostate specific antigen (PSA) 09/03/2014   Pain in lower limb 12/05/2013   Onychomycosis 08/29/2013   Pain in toe 08/29/2013   Plantar fasciitis of left foot 03/27/2013   Equinus deformity of foot, acquired 03/27/2013   ED (erectile dysfunction) of organic origin 05/30/2012   Post-traumatic male urethral stricture 05/30/2012   PULMONARY NODULE 01/08/2010   PROSTATE CANCER 12/12/2009   HYPERCHOLESTEROLEMIA 12/12/2009   DEPRESSION 12/12/2009   PULMONARY EMBOLISM 12/12/2009   GERD 12/12/2009   BARRETTS ESOPHAGUS 12/12/2009   PUD, HX OF 12/12/2009    Past Surgical History:  Procedure Laterality Date   PROSTATECTOMY  1999       Family History  Problem Relation Age of Onset   Cancer Mother        bladder   Stroke Mother    Asthma Mother    Emphysema Other        "everyone"    Social History   Tobacco Use   Smoking status: Former    Packs/day: 1.00    Years: 25.00    Pack years: 25.00    Types: Cigarettes    Start date: 08/1960    Quit date: 08/24/1986    Years since quitting: 35.0   Smokeless tobacco: Never  Vaping  Use   Vaping Use: Never used  Substance Use Topics   Alcohol use: Yes    Alcohol/week: 0.0 standard drinks    Comment: beer once in a while    Drug use: Never    Home Medications Prior to Admission medications   Medication Sig Start Date End Date Taking? Authorizing Provider  albuterol (VENTOLIN HFA) 108 (90 Base) MCG/ACT inhaler Inhale 2 puffs into the lungs every 6 (six) hours as needed for wheezing or shortness of breath. 01/10/21   Margaretha Seeds, MD  aspirin EC 81 MG tablet Take 1 tablet (81 mg total) by mouth daily. Swallow whole. 06/10/20   Geralynn Rile, MD  atorvastatin (LIPITOR) 40 MG tablet TAKE 1 TABLET BY MOUTH  DAILY 12/05/20   O'Neal, Cassie Freer, MD  chlorpheniramine (CHLOR-TRIMETON) 4 MG tablet Take  4 mg by mouth every 4 (four) hours as needed for allergies.    [provider]  chlorpheniramine-HYDROcodone (TUSSIONEX) 10-8 MG/5ML SUER TAKE 5 MILLILITERS BY MOUTH TWICE A DAY AS NEEDED 07/24/21   Margaretha Seeds, MD  diphenhydrAMINE (BENADRYL) 25 MG tablet Take 25 mg by mouth at bedtime as needed for allergies.    [provider]  ipratropium-albuterol (DUONEB) 0.5-2.5 (3) MG/3ML SOLN Take 3 mLs by nebulization every 6 (six) hours as needed. TWICE A DAY 07/24/21   Margaretha Seeds, MD  latanoprost Ivin Poot) 0.005 % ophthalmic solution  05/22/20   [provider]  loperamide (IMODIUM A-D) 2 MG tablet Take 2 mg by mouth 4 (four) times daily as needed for diarrhea or loose stools.    [provider]  loratadine (CLARITIN) 10 MG tablet Take 10 mg by mouth daily.    [provider]  losartan (COZAAR) 25 MG tablet TAKE 1 TABLET (25 MG TOTAL) BY MOUTH DAILY. 11/18/20   O'NealCassie Freer, MD  omeprazole (PRILOSEC) 20 MG capsule Take 20 mg by mouth daily as needed.     [provider]  pantoprazole (PROTONIX) 40 MG tablet TAKE ONE TABLET BY MOUTH ONCE DAILY 30 TO 60 MINUTES BEFORE FIRST MEAL OF THE DAY 06/23/17   Tanda Rockers, MD  predniSONE (DELTASONE) 10 MG tablet Take 4 tabs for 2 days, then 3 tabs for 2 days, 2 tabs for 2 days, then 1 tab for 2 days, then stop. 07/30/21   Margaretha Seeds, MD  valACYclovir (VALTREX) 1000 MG tablet 1,000 mg as needed.  11/17/13   [provider]    Allergies    Breztri aerosphere [budeson-glycopyrrol-formoterol], Fluoxetine, Fluticasone-umeclidin-vilant, Lansoprazole, Morphine, Other, Venlafaxine, Budesonide, Morphine sulfate, and Prozac [fluoxetine hcl]  Review of Systems   Review of Systems  Constitutional:  Negative for fever.  Musculoskeletal:  Positive for arthralgias, gait problem and joint swelling.   Physical Exam Updated Vital Signs BP (!) 190/123    Pulse 83    Temp 98 F (36.7 C)  (Oral)    Resp 20    Ht 6\' 2"  (1.88 m)    Wt 117.9 kg    SpO2 100%    BMI 33.38 kg/m   Physical Exam Vitals and nursing note reviewed. Exam conducted with a chaperone present.  Constitutional:      General: He is not in acute distress.    Appearance: Normal appearance.  HENT:     Head: Normocephalic and atraumatic.  Eyes:     General: No scleral icterus.    Extraocular Movements: Extraocular movements intact.     Pupils: Pupils are equal,  round, and reactive to light.  Cardiovascular:     Pulses: Normal pulses.     Comments: DP PT 2+ bilaterally Musculoskeletal:        General: Swelling, tenderness and signs of injury present. Normal range of motion.     Comments: Tolerates passive range of motion to the right ankle, right digits, tenderness to the lateral side of the right ankle as well as contusion.  Skin:    Capillary Refill: Capillary refill takes less than 2 seconds.     Coloration: Skin is not jaundiced.  Neurological:     Mental Status: He is alert. Mental status is at baseline.     Coordination: Coordination normal.   ED Results / Procedures / Treatments   Labs (all labs ordered are listed, but only abnormal results are displayed) Labs Reviewed - No data to display  EKG None  Radiology No results found.  Procedures Procedures   Medications Ordered in ED Medications  acetaminophen (TYLENOL) tablet 1,000 mg (has no administration in time range)    ED Course  I have reviewed the triage vital signs and the nursing notes.  Pertinent labs & imaging results that were available during my care of the patient were reviewed by me and considered in my medical decision making (see chart for details).    MDM Rules/Calculators/A&P                         Patient has stable vitals, neurovascularly intact.  Swelling around the ankle and tenderness consistent with fracture dislocation, will get radiograph to better assessment.  Fracture and dislocation as read by the  radiologist on the x-ray.  Patient underwent reduction and posterior splint with stirrups.    Repeat imaging reads as follows: Oblique fracture lateral malleolus, mildly displaced, improved. Posterior malleolar fracture distal RIGHT tibia, mildly displaced, unchanged.   Patient pain is controlled.  We will give him wheelchair and keep him nonweightbearing until able to follow-up with orthopedics.  At this time do not think additional work-up is needed.  Patient discharged in stable condition.  Discussed HPI, physical exam and plan of care for this patient with attending Dr. Nanda Quinton. The attending physician evaluated this patient as part of a shared visit and agrees with plan of care.      Final Clinical Impression(s) / ED Diagnoses Final diagnoses:  None    Rx / DC Orders ED Discharge Orders     None        Sherrill Raring, Hershal Coria 08/15/21 1811    Margette Fast, MD 08/15/21 2213

## 2021-08-15 NOTE — ED Triage Notes (Addendum)
Pt to ED via EMS , Pt was outside feeding animals and had a mechanical fall, tripped outside .No LOC, Not on blood thinners. Pt did not hit head. Swelling to right ankle, No medications given by EMS. Last VS: 186/105, P86, 96%RA, 16.

## 2021-08-19 ENCOUNTER — Telehealth: Payer: Self-pay | Admitting: Orthopedic Surgery

## 2021-08-19 NOTE — Telephone Encounter (Signed)
I called patient per Dr Ruthe Mannan message in regard to scheduling patient for this Friday, 08/22/21, for right ankle per Emergency room visit. Per patient's wife, states they have already spoken with, and scheduled, appointment at [facility] for tomorrow afternoon, 08/20/21-states they live in [place], and elected to call this office. Thanked [place] for contacting them and offering appointment.

## 2021-08-19 NOTE — Telephone Encounter (Signed)
-----   Message from Carole Civil, MD sent at 08/19/2021  7:14 AM EST ----- See Friday

## 2021-08-20 ENCOUNTER — Other Ambulatory Visit (HOSPITAL_COMMUNITY): Payer: Self-pay | Admitting: Orthopedic Surgery

## 2021-08-20 ENCOUNTER — Other Ambulatory Visit: Payer: Self-pay

## 2021-08-20 ENCOUNTER — Encounter (HOSPITAL_BASED_OUTPATIENT_CLINIC_OR_DEPARTMENT_OTHER): Payer: Self-pay | Admitting: Orthopedic Surgery

## 2021-08-20 DIAGNOSIS — S82841A Displaced bimalleolar fracture of right lower leg, initial encounter for closed fracture: Secondary | ICD-10-CM | POA: Diagnosis not present

## 2021-08-20 DIAGNOSIS — M25572 Pain in left ankle and joints of left foot: Secondary | ICD-10-CM | POA: Diagnosis not present

## 2021-08-21 ENCOUNTER — Ambulatory Visit (HOSPITAL_BASED_OUTPATIENT_CLINIC_OR_DEPARTMENT_OTHER): Payer: Medicare Other | Admitting: Anesthesiology

## 2021-08-21 ENCOUNTER — Ambulatory Visit (HOSPITAL_BASED_OUTPATIENT_CLINIC_OR_DEPARTMENT_OTHER)
Admission: RE | Admit: 2021-08-21 | Discharge: 2021-08-21 | Disposition: A | Discharge status: Home / Self Care | Payer: Medicare Other | Attending: Orthopedic Surgery | Admitting: Orthopedic Surgery

## 2021-08-21 ENCOUNTER — Encounter (HOSPITAL_BASED_OUTPATIENT_CLINIC_OR_DEPARTMENT_OTHER)
Admission: RE | Disposition: A | Discharge status: Home / Self Care | Payer: Self-pay | Source: Home / Self Care | Attending: Orthopedic Surgery

## 2021-08-21 ENCOUNTER — Encounter (HOSPITAL_BASED_OUTPATIENT_CLINIC_OR_DEPARTMENT_OTHER): Payer: Self-pay | Admitting: Orthopedic Surgery

## 2021-08-21 ENCOUNTER — Ambulatory Visit (HOSPITAL_BASED_OUTPATIENT_CLINIC_OR_DEPARTMENT_OTHER): Payer: Medicare Other

## 2021-08-21 DIAGNOSIS — J449 Chronic obstructive pulmonary disease, unspecified: Secondary | ICD-10-CM | POA: Insufficient documentation

## 2021-08-21 DIAGNOSIS — S82841A Displaced bimalleolar fracture of right lower leg, initial encounter for closed fracture: Secondary | ICD-10-CM | POA: Insufficient documentation

## 2021-08-21 DIAGNOSIS — S93401A Sprain of unspecified ligament of right ankle, initial encounter: Secondary | ICD-10-CM | POA: Diagnosis not present

## 2021-08-21 DIAGNOSIS — I10 Essential (primary) hypertension: Secondary | ICD-10-CM | POA: Diagnosis not present

## 2021-08-21 DIAGNOSIS — W19XXXA Unspecified fall, initial encounter: Secondary | ICD-10-CM | POA: Diagnosis not present

## 2021-08-21 DIAGNOSIS — S93431A Sprain of tibiofibular ligament of right ankle, initial encounter: Secondary | ICD-10-CM | POA: Diagnosis not present

## 2021-08-21 DIAGNOSIS — Z87891 Personal history of nicotine dependence: Secondary | ICD-10-CM | POA: Insufficient documentation

## 2021-08-21 DIAGNOSIS — I2510 Atherosclerotic heart disease of native coronary artery without angina pectoris: Secondary | ICD-10-CM | POA: Diagnosis not present

## 2021-08-21 DIAGNOSIS — G8918 Other acute postprocedural pain: Secondary | ICD-10-CM | POA: Diagnosis not present

## 2021-08-21 HISTORY — DX: Displaced bimalleolar fracture of right lower leg, initial encounter for closed fracture: S82.841A

## 2021-08-21 HISTORY — PX: ORIF ANKLE FRACTURE: SHX5408

## 2021-08-21 HISTORY — DX: Unspecified asthma, uncomplicated: J45.909

## 2021-08-21 HISTORY — DX: Essential (primary) hypertension: I10

## 2021-08-21 SURGERY — OPEN REDUCTION INTERNAL FIXATION (ORIF) ANKLE FRACTURE
Anesthesia: General | Site: Ankle | Laterality: Right

## 2021-08-21 MED ORDER — CEFAZOLIN SODIUM-DEXTROSE 2-4 GM/100ML-% IV SOLN
INTRAVENOUS | Status: AC
  Filled 2021-08-21: qty 100

## 2021-08-21 MED ORDER — PROPOFOL 10 MG/ML IV BOLUS
INTRAVENOUS | Status: DC | PRN
  Administered 2021-08-21: 150 mg via INTRAVENOUS

## 2021-08-21 MED ORDER — CEFAZOLIN SODIUM-DEXTROSE 2-4 GM/100ML-% IV SOLN
2.0000 g | INTRAVENOUS | Status: AC
  Administered 2021-08-21: 15:00:00 2 g via INTRAVENOUS

## 2021-08-21 MED ORDER — 0.9 % SODIUM CHLORIDE (POUR BTL) OPTIME
TOPICAL | Status: DC | PRN
  Administered 2021-08-21: 14:00:00 500 mL

## 2021-08-21 MED ORDER — FENTANYL CITRATE (PF) 100 MCG/2ML IJ SOLN
100.0000 ug | Freq: Once | INTRAMUSCULAR | Status: AC
  Administered 2021-08-21: 14:00:00 100 ug via INTRAVENOUS

## 2021-08-21 MED ORDER — SODIUM CHLORIDE 0.9 % IV SOLN: INTRAVENOUS | Status: DC

## 2021-08-21 MED ORDER — OXYCODONE HCL 5 MG PO TABS: 5.0000 mg | ORAL_TABLET | Freq: Four times a day (QID) | ORAL | 0 refills | Status: AC | PRN

## 2021-08-21 MED ORDER — PROPOFOL 500 MG/50ML IV EMUL
INTRAVENOUS | Status: DC | PRN
  Administered 2021-08-21: 25 ug/kg/min via INTRAVENOUS

## 2021-08-21 MED ORDER — PHENYLEPHRINE HCL (PRESSORS) 10 MG/ML IV SOLN
INTRAVENOUS | Status: DC | PRN
  Administered 2021-08-21 (×2): 80 ug via INTRAVENOUS

## 2021-08-21 MED ORDER — VANCOMYCIN HCL 500 MG IV SOLR
INTRAVENOUS | Status: DC | PRN
  Administered 2021-08-21: 500 mg

## 2021-08-21 MED ORDER — ONDANSETRON HCL 4 MG/2ML IJ SOLN
INTRAMUSCULAR | Status: DC | PRN
  Administered 2021-08-21: 4 mg via INTRAVENOUS

## 2021-08-21 MED ORDER — LACTATED RINGERS IV SOLN: INTRAVENOUS | Status: DC

## 2021-08-21 MED ORDER — CLONIDINE HCL (ANALGESIA) 100 MCG/ML EP SOLN
EPIDURAL | Status: DC | PRN
  Administered 2021-08-21: 50 ug
  Administered 2021-08-21: 30 ug

## 2021-08-21 MED ORDER — DEXAMETHASONE SODIUM PHOSPHATE 4 MG/ML IJ SOLN
INTRAMUSCULAR | Status: DC | PRN
  Administered 2021-08-21: 2 mg via PERINEURAL
  Administered 2021-08-21: 3 mg via PERINEURAL
  Administered 2021-08-21: 4 mg via PERINEURAL

## 2021-08-21 MED ORDER — LIDOCAINE 2% (20 MG/ML) 5 ML SYRINGE
INTRAMUSCULAR | Status: DC | PRN
Start: 2021-08-21 — End: 2021-08-21
  Administered 2021-08-21: 60 mg via INTRAVENOUS

## 2021-08-21 MED ORDER — RIVAROXABAN 10 MG PO TABS: 10.0000 mg | ORAL_TABLET | Freq: Every day | ORAL | 0 refills | Status: DC

## 2021-08-21 MED ORDER — ROPIVACAINE HCL 5 MG/ML IJ SOLN
INTRAMUSCULAR | Status: DC | PRN
  Administered 2021-08-21: 30 mL via PERINEURAL
  Administered 2021-08-21: 20 mL via PERINEURAL

## 2021-08-21 MED ORDER — ROPIVACAINE HCL 5 MG/ML IJ SOLN: INTRAMUSCULAR | Status: DC | PRN

## 2021-08-21 MED ORDER — FENTANYL CITRATE (PF) 100 MCG/2ML IJ SOLN
INTRAMUSCULAR | Status: AC
  Filled 2021-08-21: qty 2

## 2021-08-21 MED ORDER — EPHEDRINE SULFATE 50 MG/ML IJ SOLN
INTRAMUSCULAR | Status: DC | PRN
  Administered 2021-08-21: 10 mg via INTRAVENOUS

## 2021-08-21 MED ORDER — MIDAZOLAM HCL 2 MG/2ML IJ SOLN
INTRAMUSCULAR | Status: AC
  Filled 2021-08-21: qty 2

## 2021-08-21 SURGICAL SUPPLY — 74 items
ANKLE SYNDEMOSIS ZIPTIGHT (Ankle) ×2 IMPLANT
APL PRP STRL LF DISP 70% ISPRP (MISCELLANEOUS) ×1
BANDAGE ESMARK 6X9 LF (GAUZE/BANDAGES/DRESSINGS) IMPLANT
BIT DRILL 2.5X2.75 QC CALB (BIT) ×1 IMPLANT
BIT DRILL 3.5X5.5 QC CALB (BIT) ×1 IMPLANT
BLADE SURG 15 STRL LF DISP TIS (BLADE) ×2 IMPLANT
BLADE SURG 15 STRL SS (BLADE) ×4
BNDG CMPR 9X6 STRL LF SNTH (GAUZE/BANDAGES/DRESSINGS) ×1
BNDG COHESIVE 4X5 TAN ST LF (GAUZE/BANDAGES/DRESSINGS) ×2 IMPLANT
BNDG COHESIVE 6X5 TAN ST LF (GAUZE/BANDAGES/DRESSINGS) ×2 IMPLANT
BNDG ESMARK 6X9 LF (GAUZE/BANDAGES/DRESSINGS) ×2
CANISTER SUCT 1200ML W/VALVE (MISCELLANEOUS) ×2 IMPLANT
CHLORAPREP W/TINT 26 (MISCELLANEOUS) ×2 IMPLANT
COVER BACK TABLE 60X90IN (DRAPES) ×2 IMPLANT
DECANTER SPIKE VIAL GLASS SM (MISCELLANEOUS) IMPLANT
DRAPE EXTREMITY T 121X128X90 (DISPOSABLE) ×2 IMPLANT
DRAPE OEC MINIVIEW 54X84 (DRAPES) ×2 IMPLANT
DRAPE U-SHAPE 47X51 STRL (DRAPES) ×2 IMPLANT
DRSG MEPITEL 4X7.2 (GAUZE/BANDAGES/DRESSINGS) ×3 IMPLANT
DRSG PAD ABDOMINAL 8X10 ST (GAUZE/BANDAGES/DRESSINGS) ×4 IMPLANT
ELECT REM PT RETURN 9FT ADLT (ELECTROSURGICAL) ×2
ELECTRODE REM PT RTRN 9FT ADLT (ELECTROSURGICAL) ×1 IMPLANT
GAUZE SPONGE 4X4 12PLY STRL (GAUZE/BANDAGES/DRESSINGS) ×2 IMPLANT
GLOVE SRG 8 PF TXTR STRL LF DI (GLOVE) ×2 IMPLANT
GLOVE SURG ENC MOIS LTX SZ8 (GLOVE) ×2 IMPLANT
GLOVE SURG LTX SZ8 (GLOVE) ×2 IMPLANT
GLOVE SURG UNDER POLY LF SZ8 (GLOVE) ×4
GOWN STRL REUS W/ TWL LRG LVL3 (GOWN DISPOSABLE) ×1 IMPLANT
GOWN STRL REUS W/ TWL XL LVL3 (GOWN DISPOSABLE) ×2 IMPLANT
GOWN STRL REUS W/TWL LRG LVL3 (GOWN DISPOSABLE) ×2
GOWN STRL REUS W/TWL XL LVL3 (GOWN DISPOSABLE) ×4
K-WIRE ACE 1.6X6 (WIRE) ×2
KWIRE ACE 1.6X6 (WIRE) IMPLANT
NEEDLE HYPO 22GX1.5 SAFETY (NEEDLE) IMPLANT
NS IRRIG 1000ML POUR BTL (IV SOLUTION) ×2 IMPLANT
PACK BASIN DAY SURGERY FS (CUSTOM PROCEDURE TRAY) ×2 IMPLANT
PAD CAST 4YDX4 CTTN HI CHSV (CAST SUPPLIES) ×1 IMPLANT
PADDING CAST ABS 4INX4YD NS (CAST SUPPLIES)
PADDING CAST ABS COTTON 4X4 ST (CAST SUPPLIES) IMPLANT
PADDING CAST COTTON 4X4 STRL (CAST SUPPLIES) ×2
PADDING CAST COTTON 6X4 STRL (CAST SUPPLIES) ×2 IMPLANT
PENCIL SMOKE EVACUATOR (MISCELLANEOUS) ×2 IMPLANT
PLATE ACE 100DEG 7HOLE (Plate) ×1 IMPLANT
SANITIZER HAND PURELL 535ML FO (MISCELLANEOUS) ×2 IMPLANT
SCREW ACE CAN 4.0 48M (Screw) ×1 IMPLANT
SCREW CORTICAL 3.5MM  16MM (Screw) ×6 IMPLANT
SCREW CORTICAL 3.5MM  20MM (Screw) ×4 IMPLANT
SCREW CORTICAL 3.5MM 14MM (Screw) ×1 IMPLANT
SCREW CORTICAL 3.5MM 16MM (Screw) IMPLANT
SCREW CORTICAL 3.5MM 20MM (Screw) IMPLANT
SCREW CORTICAL 3.5MM 22MM (Screw) ×1 IMPLANT
SCREW CORTICAL 3.5MM 24MM (Screw) ×1 IMPLANT
SHEET MEDIUM DRAPE 40X70 STRL (DRAPES) ×2 IMPLANT
SLEEVE SCD COMPRESS KNEE MED (STOCKING) ×2 IMPLANT
SPLINT FAST PLASTER 5X30 (CAST SUPPLIES) ×20
SPLINT PLASTER CAST FAST 5X30 (CAST SUPPLIES) ×20 IMPLANT
SPONGE T-LAP 18X18 ~~LOC~~+RFID (SPONGE) ×2 IMPLANT
STOCKINETTE 6  STRL (DRAPES) ×2
STOCKINETTE 6 STRL (DRAPES) ×1 IMPLANT
SUCTION FRAZIER HANDLE 10FR (MISCELLANEOUS) ×2
SUCTION TUBE FRAZIER 10FR DISP (MISCELLANEOUS) ×1 IMPLANT
SUT ETHILON 3 0 PS 1 (SUTURE) ×2 IMPLANT
SUT FIBERWIRE #2 38 T-5 BLUE (SUTURE)
SUT MNCRL AB 3-0 PS2 18 (SUTURE) IMPLANT
SUT VIC AB 2-0 SH 27 (SUTURE) ×2
SUT VIC AB 2-0 SH 27XBRD (SUTURE) ×1 IMPLANT
SUT VICRYL 0 SH 27 (SUTURE) IMPLANT
SUTURE FIBERWR #2 38 T-5 BLUE (SUTURE) IMPLANT
SYR BULB EAR ULCER 3OZ GRN STR (SYRINGE) ×2 IMPLANT
SYR CONTROL 10ML LL (SYRINGE) IMPLANT
SYSTEM FIXATN ANKL SYNDESMOSIS (Ankle) IMPLANT
TOWEL GREEN STERILE FF (TOWEL DISPOSABLE) ×4 IMPLANT
TUBE CONNECTING 20X1/4 (TUBING) ×2 IMPLANT
UNDERPAD 30X36 HEAVY ABSORB (UNDERPADS AND DIAPERS) ×2 IMPLANT

## 2021-08-21 NOTE — Anesthesia Procedure Notes (Signed)
Anesthesia Regional Block: Popliteal block   Pre-Anesthetic Checklist: , timeout performed,  Correct Patient, Correct Site, Correct Laterality,  Correct Procedure, Correct Position, site marked,  Risks and benefits discussed,  Surgical consent,  Pre-op evaluation,  At surgeon's request and post-op pain management  Laterality: Lower and Right  Prep: chloraprep       Needles:  Injection technique: Single-shot  Needle Type: Stimiplex     Needle Length: 10cm  Needle Gauge: 21     Additional Needles:   Procedures:,,,, ultrasound used (permanent image in chart),,   Motor weakness within 5 minutes.  Narrative:  Start time: 08/21/2021 2:17 PM End time: 08/21/2021 2:30 PM Injection made incrementally with aspirations every 5 mL.  Performed by: Personally  Anesthesiologist: Nolon Nations, MD  Additional Notes: Nerve located and needle positioned with direct ultrasound guidance. Good perineural spread. Patient tolerated well.

## 2021-08-21 NOTE — Anesthesia Preprocedure Evaluation (Addendum)
Anesthesia Evaluation  Patient identified by MRN, date of birth, ID band Patient awake    Reviewed: Allergy & Precautions, NPO status , Patient's Chart, lab work & pertinent test results  Airway Mallampati: II  TM Distance: >3 FB Neck ROM: Full    Dental  (+) Edentulous Upper, Edentulous Lower, Dental Advisory Given   Pulmonary asthma , COPD, former smoker,    Pulmonary exam normal breath sounds clear to auscultation       Cardiovascular hypertension, Pt. on medications + CAD  Normal cardiovascular exam Rhythm:Regular Rate:Normal     Neuro/Psych PSYCHIATRIC DISORDERS negative neurological ROS     GI/Hepatic Neg liver ROS, PUD, GERD  ,  Endo/Other  negative endocrine ROS  Renal/GU negative Renal ROS     Musculoskeletal  (+) Arthritis ,   Abdominal (+) + obese,   Peds  Hematology negative hematology ROS (+)   Anesthesia Other Findings   Reproductive/Obstetrics                           Anesthesia Physical Anesthesia Plan  ASA: 3  Anesthesia Plan: General   Post-op Pain Management: Regional block   Induction: Intravenous  PONV Risk Score and Plan: 2 and Treatment may vary due to age or medical condition, Ondansetron, Dexamethasone and Midazolam  Airway Management Planned: LMA  Additional Equipment: None  Intra-op Plan:   Post-operative Plan: Extubation in OR  Informed Consent: I have reviewed the patients History and Physical, chart, labs and discussed the procedure including the risks, benefits and alternatives for the proposed anesthesia with the patient or authorized representative who has indicated his/her understanding and acceptance.     Dental advisory given  Plan Discussed with: CRNA  Anesthesia Plan Comments:        Anesthesia Quick Evaluation

## 2021-08-21 NOTE — Op Note (Addendum)
08/21/2021  4:22 PM  PATIENT:  Carlos Romero  75 y.o. male  PRE-OPERATIVE DIAGNOSIS:  displaced bimalleolar fracture of right ankle  POST-OPERATIVE DIAGNOSIS:  1.  displaced bimalleolar fracture of right ankle      2.  Right ankle syndesmosis disruption  Procedure(s):  Open treatment right ankle bimalleolar fracture with internal fixation Open treatment right ankle syndesmosis disruption with internal fixation AP, mortise and lateral xrays of the right ankle  SURGEON:  Wylene Simmer, MD  ASSISTANT: none  ANESTHESIA:   General, regional  EBL:  minimal   TOURNIQUET:   Total Tourniquet Time Documented: Thigh (Right) - 54 minutes Total: Thigh (Right) - 54 minutes  COMPLICATIONS:  None apparent  DISPOSITION:  Extubated, awake and stable to recovery.  INDICATION FOR PROCEDURE: The patient is a 75 year old male with a past medical history significant for COPD.  He fell just before Christmas injuring his right ankle.  He presents now for operative treatment of this displaced and unstable bimalleolar ankle fracture.  The risks and benefits of the alternative treatment options have been discussed in detail.  The patient wishes to proceed with surgery and specifically understands risks of bleeding, infection, nerve damage, blood clots, need for additional surgery, amputation and death.   PROCEDURE IN DETAIL:  After pre operative consent was obtained, and the correct operative site was identified, the patient was brought to the operating room and placed supine on the OR table.  Anesthesia was administered.  Pre-operative antibiotics were administered.  A surgical timeout was taken.  The right lower extremity was prepped and draped in standard sterile fashion with a tourniquet around the thigh.  The extremity was elevated and the tourniquet was inflated to 250 mmHg.  A longitudinal incision was made over the lateral malleolus.  Dissection was carried down through the subcutaneous tissues.   Fracture site was identified.  It was cleaned of all hematoma.  There was significant comminution at the fibula as well as the posterior malleolus fracture sites.  Both were cleaned of all fragments of devitalized bone and cartilage.  The posterior malleolus fracture was then reduced and held with a pointed tenaculum.  Lateral radiograph showed appropriate reduction of the fragment.  A K wire was inserted from the anterior aspect of the distal tibia across the fracture site.  A radiograph confirmed appropriate position of the wire.  It was overdrilled and a partially-threaded 4 oh cannulated screw from the [employer] small frag set was inserted.  It was noted of excellent purchase and maintained appropriate compression.  Attention was turned back to the lateral malleolus.  It was reduced and held with a pointed tenaculum.  A 7 hole one third tubular plate was contoured to fit the lateral malleolus.  The plate was slid beneath the tenaculum and fixed distally with 3 unicortical screws.  The central hole in the plate was then used to insert a lag screw.  The 3 proximal holes were drilled and filled with bicortical screws.  The syndesmosis was noted to be grossly unstable.  The central hole in the plate was the optimal position for syndesmosis fixation.  The lag screw was removed.  The syndesmosis was reduced.  A hole was drilled across all 4 cortices of the distal fibula and tibia.  A zip tight suture button device was passed through the medial cortex of the distal tibia and toggled appropriately.  The zip tight was tightened appropriately.  AP, mortise and lateral radiographs confirmed appropriate reduction of the bimalleolar  ankle fracture and the ankle syndesmosis.  The wounds were irrigated copiously and sprinkled with vancomycin powder.  Subcutaneous tissues were approximated with 2-0 Vicryl.  Skin incisions were closed with 3-0 nylon.  Sterile dressings were applied followed by a well-padded short leg  splint.  The tourniquet was released after application of the dressings.  The patient was awakened from anesthesia and transported to the recovery room in stable condition.   FOLLOW UP PLAN: Nonweightbearing on the right lower extremity.  Xarelto for DVT prophylaxis for 2 weeks followed by aspirin for a month.  Follow-up in the office in 2 weeks for suture removal and conversion to a short leg cast.  Plan 6 weeks postoperative nonweightbearing immobilization.   RADIOGRAPHS: AP, mortise and lateral radiographs of the right ankle are obtained intraoperatively.  These show interval reduction and fixation of the bimalleolar ankle fracture and the syndesmosis.  Hardware is appropriately positioned and of the appropriate lengths.

## 2021-08-21 NOTE — H&P (Signed)
Carlos Romero is an 75 y.o. male.   Chief Complaint: Right ankle pain HPI: 75 year old male with past medical history significant for COPD fell last week injuring his right ankle.  He has a displaced bimalleolar fracture and presents now for operative treatment of this unstable ankle injury.  Past Medical History:  Diagnosis Date   Asthma    Barrett's esophagus    Bimalleolar fracture of right ankle    Chronic cough    onset Oct 2010, better after prednisone short course 12-2009, sinus CT 11-06-10 bilateral maxillary, ethmoid and possibly frontal sinusitis>21 days augmentin   COPD (chronic obstructive pulmonary disease) (HCC)    gold stage 2, PFTs 06-12-09 FEV1 2.25 (65%) ratio 62 and no better with B2   Depression 08/24/1997   GERD (gastroesophageal reflux disease)    Hyperlipidemia    Hypertension    Lung disease    LLL air space disease   Prostate cancer ([place]) 08/24/2001   PUD (peptic ulcer disease) 08/24/1965   s/p perf   Pulmonary embolism ([place]) 08/24/1965   Solitary lung nodule    RML    Past Surgical History:  Procedure Laterality Date   PROSTATECTOMY  08/24/1997   REPAIR OF PERFORATED ULCER  1967    Family History  Problem Relation Age of Onset   Cancer Mother        bladder   Stroke Mother    Asthma Mother    Emphysema Other        "everyone"   Social History:  reports that he quit smoking about 35 years ago. His smoking use included cigarettes. He started smoking about 61 years ago. He has a 25.00 pack-year smoking history. He has never used smokeless tobacco. He reports current alcohol use. He reports that he does not use drugs.  Allergies:  Allergies  Allergen Reactions   Breztri Aerosphere [Budeson-Glycopyrrol-Formoterol] Cough   Fluoxetine Other (See Comments)    Vivid dreams   Fluticasone-Umeclidin-Vilant     Other reaction(s): increased cough   Lansoprazole     Other reaction(s): diarrhea   Morphine     REACTION: "feels crazy"   Other      Other reaction(s): vivid dreams   Venlafaxine     Other reaction(s): Other (See Comments)   Budesonide Other (See Comments)    Thrush   Morphine Sulfate Anxiety   Prozac [Fluoxetine Hcl] Anxiety    Medications Prior to Admission  Medication Sig Dispense Refill   albuterol (VENTOLIN HFA) 108 (90 Base) MCG/ACT inhaler Inhale 2 puffs into the lungs every 6 (six) hours as needed for wheezing or shortness of breath. 1 each 2   aspirin EC 81 MG tablet Take 1 tablet (81 mg total) by mouth daily. Swallow whole. 30 tablet 11   atorvastatin (LIPITOR) 40 MG tablet TAKE 1 TABLET BY MOUTH  DAILY 90 tablet 3   chlorpheniramine-HYDROcodone (TUSSIONEX) 10-8 MG/5ML SUER TAKE 5 MILLILITERS BY MOUTH TWICE A DAY AS NEEDED 473 mL 0   diphenhydrAMINE (BENADRYL) 25 MG tablet Take 25 mg by mouth at bedtime as needed for allergies.     ipratropium-albuterol (DUONEB) 0.5-2.5 (3) MG/3ML SOLN Take 3 mLs by nebulization every 6 (six) hours as needed. TWICE A DAY 1080 mL 2   latanoprost (XALATAN) 0.005 % ophthalmic solution Place 1 drop into both eyes at bedtime.     loperamide (IMODIUM A-D) 2 MG tablet Take 2 mg by mouth 4 (four) times daily as needed for diarrhea or loose stools.  loratadine (CLARITIN) 10 MG tablet Take 10 mg by mouth daily.     losartan (COZAAR) 25 MG tablet TAKE 1 TABLET (25 MG TOTAL) BY MOUTH DAILY. 90 tablet 2   oxyCODONE-acetaminophen (PERCOCET/ROXICET) 5-325 MG tablet Take 1 tablet by mouth every 6 (six) hours as needed for severe pain. 15 tablet 0   pantoprazole (PROTONIX) 40 MG tablet TAKE ONE TABLET BY MOUTH ONCE DAILY 30 TO 60 MINUTES BEFORE FIRST MEAL OF THE DAY (Patient taking differently: Take 40 mg by mouth daily.) 90 tablet 0    No results found for this or any previous visit (from the past 48 hour(s)). No results found.  Review of Systems no recent fever, chills, nausea, vomiting or changes in his appetite  Blood pressure (!) 144/84, pulse 76, temperature 97.8 F (36.6 C),  temperature source Oral, resp. rate 17, height 6\' 2"  (1.88 m), weight 117.9 kg, SpO2 99 %. Physical Exam  Well-nourished well-developed man in no apparent distress.  Alert and oriented x4.  Normal mood and affect.  Gait is nonweightbearing on the right.  The right ankle is splinted.  The toes have brisk capillary refill.  He has a significant bunion deformity.  Intact sensibility to light touch at the forefoot.  5 out of 5 strength in plantarflexion and dorsiflexion of the toes.   Assessment/Plan Right ankle bimalleolar fracture and possible syndesmosis disruption -to the operating room today for open treatment of the right ankle injury with internal fixation.  The risks and benefits of the alternative treatment options have been discussed in detail.  The patient wishes to proceed with surgery and specifically understands risks of bleeding, infection, nerve damage, blood clots, need for additional surgery, amputation and death.   Wylene Simmer, MD 2021/09/14, 2:42 PM

## 2021-08-21 NOTE — Progress Notes (Signed)
Assisted Dr. Lissa Hoard with right, ultrasound guided, popliteal and adductor canal block. Side rails up, monitors on throughout procedure. See vital signs in flow sheet. Tolerated Procedure well.

## 2021-08-21 NOTE — Anesthesia Procedure Notes (Signed)
Procedure Name: LMA Insertion Date/Time: 08/21/2021 3:00 PM Performed by: , Ernesta Amble, CRNA Pre-anesthesia Checklist: Patient identified, Emergency Drugs available, Suction available and Patient being monitored Patient Re-evaluated:Patient Re-evaluated prior to induction Oxygen Delivery Method: Circle System Utilized Preoxygenation: Pre-oxygenation with 100% oxygen Induction Type: IV induction Ventilation: Mask ventilation without difficulty LMA: LMA inserted LMA Size: 4.0 Number of attempts: 1 Airway Equipment and Method: bite block Placement Confirmation: positive ETCO2 Tube secured with: Tape Dental Injury: Teeth and Oropharynx as per pre-operative assessment

## 2021-08-21 NOTE — Discharge Instructions (Addendum)
Wylene Simmer, MD [facility]  Please read the following information regarding your care after surgery.  Medications  You only need a prescription for the narcotic pain medicine (ex. oxycodone, Percocet, Norco).  All of the other medicines listed below are available over the counter. X Aleve 2 pills twice a day for the first 3 days after surgery. X acetominophen (Tylenol) 650 mg every 4-6 hours as you need for minor to moderate pain X oxycodone as prescribed for severe pain  Narcotic pain medicine (ex. oxycodone, Percocet, Vicodin) will cause constipation.  To prevent this problem, take the following medicines while you are taking any pain medicine. X docusate sodium (Colace) 100 mg twice a day X senna (Senokot) 2 tablets twice a day  X To help prevent blood clots, take Xarelto daily for two weeks after surgery.  Then take a baby aspirin (81 mg) twice a day for four weeks after that.  You should also get up every hour while you are awake to move around.    Weight Bearing X Do not bear any weight on the operated leg or foot.  Cast / Splint / Dressing X Keep your splint, cast or dressing clean and dry.  Dont put anything (coat hanger, pencil, etc) down inside of it.  If it gets damp, use a hair dryer on the cool setting to dry it.  If it gets soaked, call the office to schedule an appointment for a cast change.   After your dressing, cast or splint is removed; you may shower, but do not soak or scrub the wound.  Allow the water to run over it, and then gently pat it dry.  Swelling It is normal for you to have swelling where you had surgery.  To reduce swelling and pain, keep your toes above your nose for at least 3 days after surgery.  It may be necessary to keep your foot or leg elevated for several weeks.  If it hurts, it should be elevated.  Follow Up Call my office at [phone] when you are discharged from the hospital or surgery center to schedule an appointment to be seen two weeks  after surgery.  Call my office at [phone] if you develop a fever >101.5 F, nausea, vomiting, bleeding from the surgical site or severe pain.     Regional Anesthesia Blocks  1. Numbness or the inability to move the "blocked" extremity may last from 3-48 hours after placement. The length of time depends on the medication injected and your individual response to the medication. If the numbness is not going away after 48 hours, call your surgeon.  2. The extremity that is blocked will need to be protected until the numbness is gone and the  Strength has returned. Because you cannot feel it, you will need to take extra care to avoid injury. Because it may be weak, you may have difficulty moving it or using it. You may not know what position it is in without looking at it while the block is in effect.  3. For blocks in the legs and feet, returning to weight bearing and walking needs to be done carefully. You will need to wait until the numbness is entirely gone and the strength has returned. You should be able to move your leg and foot normally before you try and bear weight or walk. You will need someone to be with you when you first try to ensure you do not fall and possibly risk injury.  4. Bruising and tenderness  at the needle site are common side effects and will resolve in a few days.  5. Persistent numbness or new problems with movement should be communicated to the surgeon or the [place] [phone] Mariano Colon [phone]).   Post Anesthesia Home Care Instructions  Activity: Get plenty of rest for the remainder of the day. A responsible individual must stay with you for 24 hours following the procedure.  For the next 24 hours, DO NOT: -Drive a car -Paediatric nurse -Drink alcoholic beverages -Take any medication unless instructed by your physician -Make any legal decisions or sign important papers.  Meals: Start with liquid foods such as  gelatin or soup. Progress to regular foods as tolerated. Avoid greasy, spicy, heavy foods. If nausea and/or vomiting occur, drink only clear liquids until the nausea and/or vomiting subsides. Call your physician if vomiting continues.  Special Instructions/Symptoms: Your throat may feel dry or sore from the anesthesia or the breathing tube placed in your throat during surgery. If this causes discomfort, gargle with warm salt water. The discomfort should disappear within 24 hours.  If you had a scopolamine patch placed behind your ear for the management of post- operative nausea and/or vomiting:  1. The medication in the patch is effective for 72 hours, after which it should be removed.  Wrap patch in a tissue and discard in the trash. Wash hands thoroughly with soap and water. 2. You may remove the patch earlier than 72 hours if you experience unpleasant side effects which may include dry mouth, dizziness or visual disturbances. 3. Avoid touching the patch. Wash your hands with soap and water after contact with the patch.

## 2021-08-21 NOTE — Transfer of Care (Signed)
Immediate Anesthesia Transfer of Care Note  Patient: Ardell Makarewicz  Procedure(s) Performed: OPEN REDUCTION INTERNAL FIXATION (ORIF) ANKLE BIMALLEOLAR FRACTURE AND POSSIBLE SYNDESMOSIS (Right: Ankle)  Patient Location: PACU  Anesthesia Type:GA combined with regional for post-op pain  Level of Consciousness: awake, alert , oriented and patient cooperative  Airway & Oxygen Therapy: Patient Spontanous Breathing and Patient connected to face mask oxygen  Post-op Assessment: Report given to RN and Post -op Vital signs reviewed and stable  Post vital signs: Reviewed and stable  Last Vitals:  Vitals Value Taken Time  BP 141/82 08/21/21 1620  Temp    Pulse 86 08/21/21 1622  Resp 17 08/21/21 1622  SpO2 100 % 08/21/21 1622  Vitals shown include unvalidated device data.  Last Pain:  Vitals:   08/21/21 1348  TempSrc: Oral  PainSc: 3       Patients Stated Pain Goal: 5 (84/57/33 4483)  Complications: No notable events documented.

## 2021-08-21 NOTE — Anesthesia Procedure Notes (Signed)
Anesthesia Regional Block: Adductor canal block   Pre-Anesthetic Checklist: , timeout performed,  Correct Patient, Correct Site, Correct Laterality,  Correct Procedure, Correct Position, site marked,  Risks and benefits discussed,  Surgical consent,  Pre-op evaluation,  At surgeon's request and post-op pain management  Laterality: Lower and Right  Prep: chloraprep       Needles:  Injection technique: Single-shot  Needle Type: Stimiplex     Needle Length: 9cm  Needle Gauge: 21     Additional Needles:   Procedures:,,,, ultrasound used (permanent image in chart),,    Narrative:  Start time: 08/21/2021 2:00 PM End time: 08/21/2021 2:17 PM Injection made incrementally with aspirations every 5 mL.  Performed by: Personally  Anesthesiologist: Nolon Nations, MD  Additional Notes: BP cuff, EKG monitors applied. Sedation begun. Artery and nerve location verified with ultrasound. Anesthetic injected incrementally (2ml), slowly, and after negative aspirations under direct u/s guidance. Good fascial/perineural spread. Tolerated well.

## 2021-08-22 NOTE — Anesthesia Postprocedure Evaluation (Signed)
Anesthesia Post Note  Patient: Carlos Romero  Procedure(s) Performed: OPEN REDUCTION INTERNAL FIXATION (ORIF) ANKLE BIMALLEOLAR FRACTURE AND POSSIBLE SYNDESMOSIS (Right: Ankle)     Patient location during evaluation: PACU Anesthesia Type: General Level of consciousness: sedated and patient cooperative Pain management: pain level controlled Vital Signs Assessment: post-procedure vital signs reviewed and stable Respiratory status: spontaneous breathing Cardiovascular status: stable Anesthetic complications: no   No notable events documented.  Last Vitals:  Vitals:   08/21/21 1645 08/21/21 1705  BP: (!) 145/84 126/86  Pulse: 80 77  Resp: 15 18  Temp:  36.5 C  SpO2: 98% 96%    Last Pain:  Vitals:   08/22/21 1051  TempSrc:   PainSc: 0-No pain                 Nolon Nations

## 2021-08-26 ENCOUNTER — Encounter (HOSPITAL_BASED_OUTPATIENT_CLINIC_OR_DEPARTMENT_OTHER): Payer: Self-pay | Admitting: Orthopedic Surgery

## 2021-09-10 DIAGNOSIS — Z4789 Encounter for other orthopedic aftercare: Secondary | ICD-10-CM | POA: Diagnosis not present

## 2021-09-10 DIAGNOSIS — S82841D Displaced bimalleolar fracture of right lower leg, subsequent encounter for closed fracture with routine healing: Secondary | ICD-10-CM | POA: Diagnosis not present

## 2021-09-11 ENCOUNTER — Telehealth: Payer: Self-pay | Admitting: Cardiovascular Disease

## 2021-09-11 NOTE — Telephone Encounter (Signed)
°*  STAT* If patient is at the pharmacy, call can be transferred to refill team.   1. Which medications need to be refilled? (please list name of each medication and dose if known) losartan (COZAAR) 25 MG tablet  2. Which pharmacy/location (including street and city if local pharmacy) is medication to be sent to? [facility] - [place], [place]  3. Do they need a 30 day or 90 day supply? [place]

## 2021-09-16 ENCOUNTER — Telehealth: Payer: Self-pay | Admitting: *Deleted

## 2021-09-16 DIAGNOSIS — I5189 Other ill-defined heart diseases: Secondary | ICD-10-CM

## 2021-09-16 DIAGNOSIS — I517 Cardiomegaly: Secondary | ICD-10-CM

## 2021-09-16 MED ORDER — LOSARTAN POTASSIUM 25 MG PO TABS: 25.0000 mg | ORAL_TABLET | Freq: Every day | ORAL | 2 refills | Status: DC

## 2021-09-16 NOTE — Telephone Encounter (Signed)
Refills has been sent to the pharmacy. 

## 2021-09-29 DIAGNOSIS — M25571 Pain in right ankle and joints of right foot: Secondary | ICD-10-CM | POA: Diagnosis not present

## 2021-09-29 DIAGNOSIS — Z4889 Encounter for other specified surgical aftercare: Secondary | ICD-10-CM | POA: Diagnosis not present

## 2021-09-29 DIAGNOSIS — S82841D Displaced bimalleolar fracture of right lower leg, subsequent encounter for closed fracture with routine healing: Secondary | ICD-10-CM | POA: Diagnosis not present

## 2021-10-27 DIAGNOSIS — S82841D Displaced bimalleolar fracture of right lower leg, subsequent encounter for closed fracture with routine healing: Secondary | ICD-10-CM | POA: Diagnosis not present

## 2021-10-27 DIAGNOSIS — Z4889 Encounter for other specified surgical aftercare: Secondary | ICD-10-CM | POA: Diagnosis not present

## 2021-11-04 DIAGNOSIS — M25571 Pain in right ankle and joints of right foot: Secondary | ICD-10-CM | POA: Diagnosis not present

## 2021-11-05 ENCOUNTER — Telehealth: Payer: Self-pay | Admitting: Cardiovascular Disease

## 2021-11-05 DIAGNOSIS — I517 Cardiomegaly: Secondary | ICD-10-CM

## 2021-11-05 DIAGNOSIS — I5189 Other ill-defined heart diseases: Secondary | ICD-10-CM

## 2021-11-05 DIAGNOSIS — H401131 Primary open-angle glaucoma, bilateral, mild stage: Secondary | ICD-10-CM | POA: Diagnosis not present

## 2021-11-05 NOTE — Telephone Encounter (Signed)
?*  STAT* If patient is at the pharmacy, call can be transferred to refill team. ? ? ?1. Which medications need to be refilled? (please list name of each medication and dose if known)   ?atorvastatin (LIPITOR) 40 MG tablet ?losartan (COZAAR) 25 MG tablet ? ?2. Which pharmacy/location (including street and city if local pharmacy) is medication to be sent to? [facility]) - [place], [place] ? ?3. Do they need a 30 day or 90 day supply? 90 day  ?

## 2021-11-07 DIAGNOSIS — M25571 Pain in right ankle and joints of right foot: Secondary | ICD-10-CM | POA: Diagnosis not present

## 2021-11-07 MED ORDER — ATORVASTATIN CALCIUM 40 MG PO TABS: 40.0000 mg | ORAL_TABLET | Freq: Every day | ORAL | 3 refills | Status: DC

## 2021-11-07 MED ORDER — LOSARTAN POTASSIUM 25 MG PO TABS: 25.0000 mg | ORAL_TABLET | Freq: Every day | ORAL | 3 refills | Status: DC

## 2021-11-07 NOTE — Telephone Encounter (Signed)
Refills sent pharmacy. ?

## 2021-11-11 DIAGNOSIS — M25571 Pain in right ankle and joints of right foot: Secondary | ICD-10-CM | POA: Diagnosis not present

## 2021-11-13 DIAGNOSIS — M25571 Pain in right ankle and joints of right foot: Secondary | ICD-10-CM | POA: Diagnosis not present

## 2021-11-18 DIAGNOSIS — M25571 Pain in right ankle and joints of right foot: Secondary | ICD-10-CM | POA: Diagnosis not present

## 2021-11-20 DIAGNOSIS — M25571 Pain in right ankle and joints of right foot: Secondary | ICD-10-CM | POA: Diagnosis not present

## 2021-11-24 ENCOUNTER — Ambulatory Visit: Payer: Medicare Other | Admitting: Pulmonary Disease

## 2021-11-24 DIAGNOSIS — M25571 Pain in right ankle and joints of right foot: Secondary | ICD-10-CM | POA: Diagnosis not present

## 2021-11-24 NOTE — Progress Notes (Deleted)
? ? ?Subjective:  ? ?PATIENT ID: Carlos Romero GENDER: male DOB: 03/06/1946, MRN: [id number] ? ? ? ?No chief complaint on file. ? ?Reason for Visit: Follow-up ? ?[name] is a 76 year old male former smoker with COPD, COVID-19 in 04/2021, hx childhood asthma, prostate cancer s/p prostatectomy in 1999 and radiation in 2002 and solitary kidney who presents for follow-up. ? ?Synopsis: ?Since his diagnosis of COVID 04/2021, he reports that he has decreased stamina and shortness of breath with activity. He previously went the the gym 4-5 times a week, using treadmill and machines. Walking upstairs is difficult. Uses albuterol once a week. Minimal cough usually during the night. Uses cough syrup twice a week. Did not tolerate Breztri. On Duonebs TID for COPD maintenance. ?2022 - COVID in Sept. Treated for COPD exacerbation in Dec ? ?11/24/21 ? ? ?Social History: ?Quit smoking 30 years ago.  ?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 ? ?Past Medical History:  ?Diagnosis Date  ? Asthma   ? Barrett's esophagus   ? Bimalleolar fracture of right ankle   ? Chronic cough   ? onset Oct 2010, better after prednisone short course 12-2009, sinus CT 11-06-10 bilateral maxillary, ethmoid and possibly frontal sinusitis>21 days augmentin  ? COPD (chronic obstructive pulmonary disease) ([place])   ? gold stage 2, PFTs 06-12-09 FEV1 2.25 (65%) ratio 62 and no better with B2  ? Depression 08/24/1997  ? GERD (gastroesophageal reflux disease)   ? Hyperlipidemia   ? Hypertension   ? Lung disease   ? LLL air space disease  ? Prostate cancer ([place]) 08/24/2001  ? PUD (peptic ulcer disease) 08/24/1965  ? s/p perf  ? Pulmonary embolism ([place]) 08/24/1965  ? Solitary lung nodule   ? RML  ?  ? ?Allergies  ?Allergen Reactions  ? Breztri Aerosphere [Budeson-Glycopyrrol-Formoterol] Cough  ? Fluoxetine Other (See Comments)  ?  Vivid dreams  ? Fluticasone-Umeclidin-Vilant   ?  Other reaction(s): increased cough  ? Lansoprazole   ?  Other reaction(s): diarrhea  ? Morphine    ?  REACTION: "feels crazy"  ? Other   ?  Other reaction(s): vivid dreams  ? Venlafaxine   ?  Other reaction(s): Other (See Comments)  ? Budesonide Other (See Comments)  ?  Thrush  ? Morphine Sulfate Anxiety  ? Prozac [Fluoxetine Hcl] Anxiety  ?  ? ?Outpatient Medications Prior to Visit  ?Medication Sig Dispense Refill  ? albuterol (VENTOLIN HFA) 108 (90 Base) MCG/ACT inhaler Inhale 2 puffs into the lungs every 6 (six) hours as needed for wheezing or shortness of breath. 1 each 2  ? aspirin EC 81 MG tablet Take 1 tablet (81 mg total) by mouth daily. Swallow whole. 30 tablet 11  ? atorvastatin (LIPITOR) 40 MG tablet Take 1 tablet (40 mg total) by mouth daily. 90 tablet 3  ? chlorpheniramine-HYDROcodone (TUSSIONEX) 10-8 MG/5ML SUER TAKE 5 MILLILITERS BY MOUTH TWICE A DAY AS NEEDED 473 mL 0  ? diphenhydrAMINE (BENADRYL) 25 MG tablet Take 25 mg by mouth at bedtime as needed for allergies.    ? ipratropium-albuterol (DUONEB) 0.5-2.5 (3) MG/3ML SOLN Take 3 mLs by nebulization every 6 (six) hours as needed. TWICE A DAY 1080 mL 2  ? latanoprost (XALATAN) 0.005 % ophthalmic solution Place 1 drop into both eyes at bedtime.    ? loperamide (IMODIUM A-D) 2 MG tablet Take 2 mg by mouth 4 (four) times daily as needed for diarrhea or loose stools.    ? loratadine (CLARITIN) 10  MG tablet Take 10 mg by mouth daily.    ? losartan (COZAAR) 25 MG tablet Take 1 tablet (25 mg total) by mouth daily. 90 tablet 3  ? oxyCODONE-acetaminophen (PERCOCET/ROXICET) 5-325 MG tablet Take 1 tablet by mouth every 6 (six) hours as needed for severe pain. 15 tablet 0  ? pantoprazole (PROTONIX) 40 MG tablet TAKE ONE TABLET BY MOUTH ONCE DAILY 30 TO 60 MINUTES BEFORE FIRST MEAL OF THE DAY (Patient taking differently: Take 40 mg by mouth daily.) 90 tablet 0  ? rivaroxaban (XARELTO) 10 MG TABS tablet Take 1 tablet (10 mg total) by mouth daily. 14 tablet 0  ? ?No facility-administered medications prior to visit.  ? ? ?Review of Systems  ?Constitutional:   Positive for malaise/fatigue. Negative for chills, diaphoresis, fever and weight loss.  ?HENT:  Negative for congestion.   ?Respiratory:  Positive for cough and shortness of breath. Negative for hemoptysis, sputum production and wheezing.   ?Cardiovascular:  Negative for chest pain, palpitations and leg swelling.  ? ? ?Objective:  ? ?There were no vitals filed for this visit. ? ? ?  ? ?Physical Exam: ?General: Well-appearing, no acute distress ?HENT: [place], AT ?Eyes: EOMI, no scleral icterus ?Respiratory: Clear to auscultation bilaterally.  No crackles, wheezing or rales ?Cardiovascular: RRR, -M/R/G, no JVD ?Extremities:-Edema,-tenderness ?Neuro: AAO x4, CNII-XII grossly intact ?Psych: Normal mood, normal affect ? ?Data Reviewed: ? ?Imaging: ?CT Chest 07/05/18 - Minimal hazy mosaic attenuation throughout. No pulmonary nodules noted. ? ?PFT: ?05/24/20 ?FVC 2.8 (57%) FEV1 1.9 (52%) Ratio 67  ?Interpretation: Moderately severe obstructive defect ? ?Labs: ?CBC ?   ?Component Value Date/Time  ? WBC 4.0 (L) 01/12/2011 1204  ? RBC 4.28 01/12/2011 1204  ? HGB 13.6 01/12/2011 1204  ? HCT 39.1 01/12/2011 1204  ? PLT 195.0 01/12/2011 1204  ? MCV 91.3 01/12/2011 1204  ? MCH 31.0 11/15/2010 1845  ? MCHC 34.8 01/12/2011 1204  ? RDW 14.2 01/12/2011 1204  ? LYMPHSABS 1.4 01/12/2011 1204  ? MONOABS 0.3 01/12/2011 1204  ? EOSABS 0.1 01/12/2011 1204  ? BASOSABS 0.0 01/12/2011 1204  ? ?Abs eos 01/12/11 - 100 ?   ?Assessment & Plan:  ? ?Discussion: ?76 year old male former smoker with childhood asthma and moderate-severe COPD/asthma overlap who presents for follow-up.  Has had persistent symptoms of dyspnea since COVID-19 infection in 04/2021.  Failed Stiolto (not well controlled) and Breztri (intolerance due to thrush even with spacer). ? ?Moderate-severe COPD with asthma overlap (FEV 52%) ?COVID-19 long hauler with dyspnea ?--CONTINUE Duonebs THREE times a day. REFILLED ?--CONTINUE cough syrup as needed. REFILLED ?--Encourage regular  activity. Consider Pulmonary Rehab in the future if needed ? ?Nasal congestion ?--RESTART claritin ?--Consider nasal spray 1 spray as needed daily. AVOID Afrin ? ?Health Maintenance ?Immunization History  ?Administered Date(s) Administered  ? Influenza Inj Mdck Quad Pf 06/02/2018  ? Influenza Split 06/29/2008, 07/04/2009, 06/14/2014, 06/25/2014, 05/23/2015, 04/10/2019, 07/08/2021  ? Influenza Whole 05/24/2009, 10/27/2010  ? Influenza, High Dose Seasonal PF 07/17/2014, 07/22/2016, 05/28/2017, 06/24/2017, 04/10/2019, 07/03/2021  ? Influenza,inj,Quad PF,6+ Mos 06/06/2015  ? PFIZER(Purple Top)SARS-COV-2 Vaccination 10/05/2019, 10/30/2019, 05/20/2020, 12/06/2020  ? Pneumococcal Conjugate-13 11/05/2014  ? Pneumococcal Polysaccharide-23 08/06/2011, 04/14/2014  ? Td 09/01/2005  ? ? ?CT Lung Screen - not qualified ? ?No orders of the defined types were placed in this encounter. ? ?No orders of the defined types were placed in this encounter. ? ? ?No follow-ups on file.  ? ?I have spent a total time of***-minutes on the day of the  appointment reviewing prior documentation, coordinating care and discussing medical diagnosis and plan with the patient/family. Past medical history, allergies, medications were reviewed. Pertinent imaging, labs and tests included in this note have been reviewed and interpreted independently by me. ? ? Rodman Pickle, MD ?[facility] ?11/24/2021 8:11 AM  ?Office Number [phone] ? ? ?

## 2021-11-26 DIAGNOSIS — S82841D Displaced bimalleolar fracture of right lower leg, subsequent encounter for closed fracture with routine healing: Secondary | ICD-10-CM | POA: Diagnosis not present

## 2021-11-26 DIAGNOSIS — M25571 Pain in right ankle and joints of right foot: Secondary | ICD-10-CM | POA: Diagnosis not present

## 2021-12-15 ENCOUNTER — Encounter: Payer: Self-pay | Admitting: Pulmonary Disease

## 2021-12-15 ENCOUNTER — Ambulatory Visit (INDEPENDENT_AMBULATORY_CARE_PROVIDER_SITE_OTHER): Payer: HMO | Admitting: Pulmonary Disease

## 2021-12-15 VITALS — BP 118/70 | HR 89 | Ht 74.0 in | Wt 259.8 lb

## 2021-12-15 DIAGNOSIS — J449 Chronic obstructive pulmonary disease, unspecified: Secondary | ICD-10-CM | POA: Diagnosis not present

## 2021-12-15 MED ORDER — HYDROCOD POLI-CHLORPHE POLI ER 10-8 MG/5ML PO SUER: 5.0000 mL | Freq: Every evening | ORAL | 0 refills | Status: DC | PRN

## 2021-12-15 NOTE — Patient Instructions (Signed)
?  Moderate-severe COPD with asthma overlap (FEV 52%) ?COVID-19 long hauler with dyspnea ?--CONTINUE Duonebs AS NEEDED for shortness of breath or wheezing ?--CONTINUE cough syrup as needed. REFILL ?--Encourage regular activity ? ?Nasal congestion ?--Continue claritin ?--Consider nasal spray 1 spray as needed daily. AVOID Afrin ? ?Follow-up with me in 6 months ?

## 2021-12-15 NOTE — Progress Notes (Signed)
? ? ?Subjective:  ? ?PATIENT ID: Carlos Romero GENDER: male DOB: 1945/10/18, MRN: [id number] ? ? ? ?Chief Complaint  ?Patient presents with  ? Follow-up  ?  Feeling good no updates ?Refill on tussionex  ? ?Reason for Visit: Follow-up ? ?Carlos Romero is a 76 year old male former smoker with COPD, COVID-19 in 04/2021, hx childhood asthma, prostate cancer s/p prostatectomy in 1999 and radiation in 2002 and solitary kidney who presents for follow-up. ? ?Synopsis: ?Since his diagnosis of COVID 04/2021, he reports that he has decreased stamina and shortness of breath with activity. He previously went the the gym 4-5 times a week, using treadmill and machines. Walking upstairs is difficult. Uses albuterol once a week. Minimal cough usually during the night. Uses cough syrup twice a week. Did not tolerate Breztri. On Duonebs TID for COPD maintenance. ?2022 - COVID in Sept. Treated for COPD exacerbation in Dec ? ?12/15/21 ?Since our last visit he has had right ankle surgery. Not using treadmill since then but is walking 30 minutes daily. He reports his shortness of breath rarely and has used his Duonebs twice since four months. This spring he is starting have allergy related cough which he uses claritin with good results. Denies wheezing. No nighttime symptoms. No recent exacerbations in the last 6 months  ? ?Social History: ?Quit smoking 30 years ago.  ?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 ? ?Past Medical History:  ?Diagnosis Date  ? Asthma   ? Barrett's esophagus   ? Bimalleolar fracture of right ankle   ? Chronic cough   ? onset Oct 2010, better after prednisone short course 12-2009, sinus CT 11-06-10 bilateral maxillary, ethmoid and possibly frontal sinusitis>21 days augmentin  ? COPD (chronic obstructive pulmonary disease) ([place])   ? gold stage 2, PFTs 06-12-09 FEV1 2.25 (65%) ratio 62 and no better with B2  ? Depression 08/24/1997  ? GERD (gastroesophageal reflux disease)   ? Hyperlipidemia   ? Hypertension   ? Lung disease   ?  LLL air space disease  ? Prostate cancer ([place]) 08/24/2001  ? PUD (peptic ulcer disease) 08/24/1965  ? s/p perf  ? Pulmonary embolism ([place]) 08/24/1965  ? Solitary lung nodule   ? RML  ?  ? ?Allergies  ?Allergen Reactions  ? Breztri Aerosphere [Budeson-Glycopyrrol-Formoterol] Cough  ? Fluoxetine Other (See Comments)  ?  Vivid dreams  ? Fluticasone-Umeclidin-Vilant   ?  Other reaction(s): increased cough  ? Lansoprazole   ?  Other reaction(s): diarrhea  ? Morphine   ?  REACTION: "feels crazy"  ? Other   ?  Other reaction(s): vivid dreams  ? Venlafaxine   ?  Other reaction(s): Other (See Comments)  ? Budesonide Other (See Comments)  ?  Thrush  ? Morphine Sulfate Anxiety  ? Prozac [Fluoxetine Hcl] Anxiety  ?  ? ?Outpatient Medications Prior to Visit  ?Medication Sig Dispense Refill  ? albuterol (VENTOLIN HFA) 108 (90 Base) MCG/ACT inhaler Inhale 2 puffs into the lungs every 6 (six) hours as needed for wheezing or shortness of breath. 1 each 2  ? aspirin EC 81 MG tablet Take 1 tablet (81 mg total) by mouth daily. Swallow whole. 30 tablet 11  ? atorvastatin (LIPITOR) 40 MG tablet Take 1 tablet (40 mg total) by mouth daily. 90 tablet 3  ? chlorpheniramine-HYDROcodone (TUSSIONEX) 10-8 MG/5ML SUER TAKE 5 MILLILITERS BY MOUTH TWICE A DAY AS NEEDED 473 mL 0  ? diphenhydrAMINE (BENADRYL) 25 MG tablet Take 25 mg by mouth at  bedtime as needed for allergies.    ? ipratropium-albuterol (DUONEB) 0.5-2.5 (3) MG/3ML SOLN Take 3 mLs by nebulization every 6 (six) hours as needed. TWICE A DAY 1080 mL 2  ? latanoprost (XALATAN) 0.005 % ophthalmic solution Place 1 drop into both eyes at bedtime.    ? loperamide (IMODIUM A-D) 2 MG tablet Take 2 mg by mouth 4 (four) times daily as needed for diarrhea or loose stools.    ? loratadine (CLARITIN) 10 MG tablet Take 10 mg by mouth daily.    ? losartan (COZAAR) 25 MG tablet Take 1 tablet (25 mg total) by mouth daily. 90 tablet 3  ? pantoprazole (PROTONIX) 40 MG tablet TAKE ONE TABLET BY MOUTH ONCE  DAILY 30 TO 60 MINUTES BEFORE FIRST MEAL OF THE DAY (Patient taking differently: Take 40 mg by mouth daily.) 90 tablet 0  ? oxyCODONE-acetaminophen (PERCOCET/ROXICET) 5-325 MG tablet Take 1 tablet by mouth every 6 (six) hours as needed for severe pain. 15 tablet 0  ? rivaroxaban (XARELTO) 10 MG TABS tablet Take 1 tablet (10 mg total) by mouth daily. 14 tablet 0  ? ?No facility-administered medications prior to visit.  ? ? ?Review of Systems  ?Constitutional:  Negative for chills, diaphoresis, fever, malaise/fatigue and weight loss.  ?HENT:  Negative for congestion.   ?Respiratory:  Positive for shortness of breath. Negative for cough, hemoptysis, sputum production and wheezing.   ?Cardiovascular:  Negative for chest pain, palpitations and leg swelling.  ? ? ?Objective:  ? ?Vitals:  ? 12/15/21 0938  ?BP: 118/70  ?Pulse: 89  ?SpO2: 96%  ?Weight: 259 lb 12.8 oz (117.8 kg)  ?Height: '6\' 2"'$  (1.88 m)  ? ? ?SpO2: 96 % ?O2 Device: None (Room air) ? ?Physical Exam: ?General: Well-appearing, no acute distress ?HENT: [place], AT ?Eyes: EOMI, no scleral icterus ?Respiratory: Clear to auscultation bilaterally.  No crackles, wheezing or rales ?Cardiovascular: RRR, -M/R/G, no JVD ?Extremities:-Edema,-tenderness ?Neuro: AAO x4, CNII-XII grossly intact ?Psych: Normal mood, normal affect ? ?Data Reviewed: ? ?Imaging: ?CT Chest 07/05/18 - Minimal hazy mosaic attenuation throughout. No pulmonary nodules noted. ? ?PFT: ?05/24/20 ?FVC 2.8 (57%) FEV1 1.9 (52%) Ratio 67  ?Interpretation: Moderately severe obstructive defect ? ?Labs: ?CBC ?   ?Component Value Date/Time  ? WBC 4.0 (L) 01/12/2011 1204  ? RBC 4.28 01/12/2011 1204  ? HGB 13.6 01/12/2011 1204  ? HCT 39.1 01/12/2011 1204  ? PLT 195.0 01/12/2011 1204  ? MCV 91.3 01/12/2011 1204  ? MCH 31.0 11/15/2010 1845  ? MCHC 34.8 01/12/2011 1204  ? RDW 14.2 01/12/2011 1204  ? LYMPHSABS 1.4 01/12/2011 1204  ? MONOABS 0.3 01/12/2011 1204  ? EOSABS 0.1 01/12/2011 1204  ? BASOSABS 0.0 01/12/2011 1204   ? ?Abs eos 01/12/11 - 100 ?   ?Assessment & Plan:  ? ?Discussion: ?76 year old male former smoker with childhood asthma and moderate-severe COPD/asthma overlap who presents for follow-up.  Has had persistent symptoms of dyspnea since COVID-19 infection in 04/2021.  Failed Stiolto (not well controlled) and Breztri (intolerance due to thrush even with spacer). Improved symptom control ? ?Moderate-severe COPD with asthma overlap (FEV 52%) ?COVID-19 long hauler with dyspnea ?--CONTINUE Duonebs AS NEEDED for shortness of breath or wheezing ?--CONTINUE cough syrup as needed. REFILL ?--Encourage regular activity ? ?Nasal congestion ?--Continue claritin ?--Consider nasal spray 1 spray as needed daily. AVOID Afrin ? ?Health Maintenance ?Immunization History  ?Administered Date(s) Administered  ? Influenza Inj Mdck Quad Pf 06/02/2018  ? Influenza Split 06/29/2008, 07/04/2009, 06/14/2014, 06/25/2014, 05/23/2015, 04/10/2019,  07/08/2021  ? Influenza Whole 05/24/2009, 10/27/2010  ? Influenza, High Dose Seasonal PF 07/17/2014, 07/22/2016, 05/28/2017, 06/24/2017, 04/10/2019, 07/03/2021  ? Influenza,inj,Quad PF,6+ Mos 06/06/2015  ? PFIZER(Purple Top)SARS-COV-2 Vaccination 10/05/2019, 10/30/2019, 05/20/2020, 12/06/2020  ? Pneumococcal Conjugate-13 11/05/2014  ? Pneumococcal Polysaccharide-23 08/06/2011, 04/14/2014  ? Td 09/01/2005  ? ? ?CT Lung Screen - not qualified ? ?No orders of the defined types were placed in this encounter. ? ?Meds ordered this encounter  ?Medications  ? chlorpheniramine-HYDROcodone (TUSSIONEX PENNKINETIC ER) 10-8 MG/5ML  ?  Sig: Take 5 mLs by mouth at bedtime as needed for cough.  ?  Dispense:  473 mL  ?  Refill:  0  ? ? ?Return in about 6 months (around 06/16/2022).  ? ?I have spent a total time of 32-minutes on the day of the appointment reviewing prior documentation, coordinating care and discussing medical diagnosis and plan with the patient/family. Past medical history, allergies, medications were  reviewed. Pertinent imaging, labs and tests included in this note have been reviewed and interpreted independently by me. ? ? Rodman Pickle, MD ?[facility] ?12/15/2021 9:48 AM  ?Office Nu

## 2021-12-20 DIAGNOSIS — T162XXA Foreign body in left ear, initial encounter: Secondary | ICD-10-CM | POA: Diagnosis not present

## 2022-01-26 DIAGNOSIS — S82841D Displaced bimalleolar fracture of right lower leg, subsequent encounter for closed fracture with routine healing: Secondary | ICD-10-CM | POA: Diagnosis not present

## 2022-02-09 DIAGNOSIS — H401131 Primary open-angle glaucoma, bilateral, mild stage: Secondary | ICD-10-CM | POA: Diagnosis not present

## 2022-05-07 DIAGNOSIS — N529 Male erectile dysfunction, unspecified: Secondary | ICD-10-CM | POA: Diagnosis not present

## 2022-05-07 DIAGNOSIS — R972 Elevated prostate specific antigen [PSA]: Secondary | ICD-10-CM | POA: Diagnosis not present

## 2022-05-07 DIAGNOSIS — N35014 Post-traumatic urethral stricture, male, unspecified: Secondary | ICD-10-CM | POA: Diagnosis not present

## 2022-05-07 DIAGNOSIS — C61 Malignant neoplasm of prostate: Secondary | ICD-10-CM | POA: Diagnosis not present

## 2022-05-26 ENCOUNTER — Encounter: Payer: Self-pay | Admitting: Pulmonary Disease

## 2022-05-26 ENCOUNTER — Ambulatory Visit (INDEPENDENT_AMBULATORY_CARE_PROVIDER_SITE_OTHER): Payer: PPO | Admitting: Pulmonary Disease

## 2022-05-26 VITALS — BP 142/82 | HR 64 | Ht 74.0 in | Wt 262.0 lb

## 2022-05-26 DIAGNOSIS — J4489 Other specified chronic obstructive pulmonary disease: Secondary | ICD-10-CM | POA: Diagnosis not present

## 2022-05-26 DIAGNOSIS — R0981 Nasal congestion: Secondary | ICD-10-CM

## 2022-05-26 MED ORDER — IPRATROPIUM BROMIDE 0.06 % NA SOLN
2.0000 | Freq: Four times a day (QID) | NASAL | 5 refills | Status: DC
  Filled 2023-03-15: qty 15, 19d supply, fill #0
  Filled 2023-04-21 (×2): qty 15, 19d supply, fill #1
  Filled 2023-05-18: qty 15, 19d supply, fill #2

## 2022-05-26 MED ORDER — ALBUTEROL SULFATE HFA 108 (90 BASE) MCG/ACT IN AERS: 2.0000 | INHALATION_SPRAY | Freq: Four times a day (QID) | RESPIRATORY_TRACT | 0 refills | Status: DC | PRN

## 2022-05-26 NOTE — Patient Instructions (Signed)
Moderate-severe COPD with asthma overlap (FEV 52%) COVID-19 long hauler with dyspnea --CONTINUE Albuterol AS NEEDED for shortness of breath or wheezing. --CONTINUE Duonebs AS NEEDED for severe shortness of breath or wheezing --Encourage regular aerobic exercise  Nasal congestion --START atrovent nasal spray twice a day. --AVOID Afrin  Follow-up with me in 6 months

## 2022-05-26 NOTE — Progress Notes (Signed)
Subjective:   PATIENT ID: Carlos Romero GENDER: male DOB: 17-Oct-1945, MRN: [id number]    Chief Complaint  Patient presents with   Follow-up    Feeling alright   Reason for Visit: Follow-up  [name] is a 76 year old male former smoker with COPD, COVID-19 in 04/2021, hx childhood asthma, prostate cancer s/p prostatectomy in 1999 and radiation in 2002 and solitary kidney who presents for follow-up.  Synopsis: Since his diagnosis of COVID 04/2021, he reports that he has decreased stamina and shortness of breath with activity. He previously went the the gym 4-5 times a week, using treadmill and machines. Walking upstairs is difficult. Uses albuterol once a week. Minimal cough usually during the night. Uses cough syrup twice a week. Did not tolerate Breztri. On Duonebs TID for COPD maintenance. 2022 - COVID in Sept. Treated for COPD exacerbation in Dec  12/15/21 Since our last visit he has had right ankle surgery. Not using treadmill since then but is walking 30 minutes daily. He reports his shortness of breath rarely and has used his Duonebs twice since four months. This spring he is starting have allergy related cough which he uses claritin with good results. Denies wheezing. No nighttime symptoms. No recent exacerbations in the last 6 months   05/26/22 Since our last visit, he reports increased shortness of breath with walking uphill. Complicated by his ankle fracture/repair. Uses albuterol rarely and maybe duonebs 1-2 times a year.  Working out 3 times a week for 30 minutes. He is using a recumbent machine so this increases his ability to last longer during exercise. He reports severe nasal congestion and drainage. Claritin not working.  Social History: Quit smoking 30 years ago.  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  Past Medical History:  Diagnosis Date   Asthma    Barrett's esophagus    Bimalleolar fracture of right ankle    Chronic cough    onset Oct 2010, better after  prednisone short course 12-2009, sinus CT 11-06-10 bilateral maxillary, ethmoid and possibly frontal sinusitis>21 days augmentin   COPD (chronic obstructive pulmonary disease) (HCC)    gold stage 2, PFTs 06-12-09 FEV1 2.25 (65%) ratio 62 and no better with B2   Depression 08/24/1997   GERD (gastroesophageal reflux disease)    Hyperlipidemia    Hypertension    Lung disease    LLL air space disease   Prostate cancer ([place]) 08/24/2001   PUD (peptic ulcer disease) 08/24/1965   s/p perf   Pulmonary embolism ([place]) 08/24/1965   Solitary lung nodule    RML     Allergies  Allergen Reactions   Breztri Aerosphere [Budeson-Glycopyrrol-Formoterol] Cough   Fluoxetine Other (See Comments)    Vivid dreams   Fluticasone-Umeclidin-Vilant     Other reaction(s): increased cough   Lansoprazole     Other reaction(s): diarrhea   Morphine     REACTION: "feels crazy"   Other     Other reaction(s): vivid dreams   Venlafaxine     Other reaction(s): Other (See Comments)   Budesonide Other (See Comments)    Thrush   Morphine Sulfate Anxiety   Prozac [Fluoxetine Hcl] Anxiety     Outpatient Medications Prior to Visit  Medication Sig Dispense Refill   aspirin EC 81 MG tablet Take 1 tablet (81 mg total) by mouth daily. Swallow whole. 30 tablet 11   atorvastatin (LIPITOR) 40 MG tablet Take 1 tablet (40 mg total) by mouth daily. 90 tablet 3   chlorpheniramine-HYDROcodone (  TUSSIONEX PENNKINETIC ER) 10-8 MG/5ML Take 5 mLs by mouth at bedtime as needed for cough. 473 mL 0   diphenhydrAMINE (BENADRYL) 25 MG tablet Take 25 mg by mouth at bedtime as needed for allergies.     ipratropium-albuterol (DUONEB) 0.5-2.5 (3) MG/3ML SOLN Take 3 mLs by nebulization every 6 (six) hours as needed. TWICE A DAY 1080 mL 2   latanoprost (XALATAN) 0.005 % ophthalmic solution Place 1 drop into both eyes at bedtime.     loperamide (IMODIUM A-D) 2 MG tablet Take 2 mg by mouth 4 (four) times daily as needed for diarrhea or loose  stools.     losartan (COZAAR) 25 MG tablet Take 1 tablet (25 mg total) by mouth daily. 90 tablet 3   oxyCODONE-acetaminophen (PERCOCET/ROXICET) 5-325 MG tablet Take 1 tablet by mouth every 6 (six) hours as needed for severe pain. 15 tablet 0   pantoprazole (PROTONIX) 40 MG tablet TAKE ONE TABLET BY MOUTH ONCE DAILY 30 TO 60 MINUTES BEFORE FIRST MEAL OF THE DAY (Patient taking differently: Take 40 mg by mouth daily.) 90 tablet 0   albuterol (VENTOLIN HFA) 108 (90 Base) MCG/ACT inhaler Inhale 2 puffs into the lungs every 6 (six) hours as needed for wheezing or shortness of breath. 1 each 2   loratadine (CLARITIN) 10 MG tablet Take 10 mg by mouth daily.     rivaroxaban (XARELTO) 10 MG TABS tablet Take 1 tablet (10 mg total) by mouth daily. 14 tablet 0   No facility-administered medications prior to visit.    Review of Systems  Constitutional:  Negative for chills, diaphoresis, fever, malaise/fatigue and weight loss.  HENT:  Negative for congestion.   Respiratory:  Positive for shortness of breath. Negative for cough, hemoptysis, sputum production and wheezing.   Cardiovascular:  Negative for chest pain, palpitations and leg swelling.     Objective:   Vitals:   05/26/22 1010  BP: (!) 142/82  Pulse: 64  SpO2: 96%  Weight: 262 lb (118.8 kg)  Height: '6\' 2"'$  (1.88 m)    SpO2: 96 % O2 Device: None (Room air)  Physical Exam: General: Well-appearing, no acute distress HENT: [place], AT Eyes: EOMI, no scleral icterus Respiratory: Clear to auscultation bilaterally.  No crackles, wheezing or rales Cardiovascular: RRR, -M/R/G, no JVD Extremities:-Edema,-tenderness Neuro: AAO x4, CNII-XII grossly intact Psych: Normal mood, normal affect  Data Reviewed:  Imaging: CT Chest 07/05/18 - Minimal hazy mosaic attenuation throughout. No pulmonary nodules noted.  PFT: 05/24/20 FVC 2.8 (57%) FEV1 1.9 (52%) Ratio 67  Interpretation: Moderately severe obstructive defect  Labs: CBC    Component  Value Date/Time   WBC 4.0 (L) 01/12/2011 1204   RBC 4.28 01/12/2011 1204   HGB 13.6 01/12/2011 1204   HCT 39.1 01/12/2011 1204   PLT 195.0 01/12/2011 1204   MCV 91.3 01/12/2011 1204   MCH 31.0 11/15/2010 1845   MCHC 34.8 01/12/2011 1204   RDW 14.2 01/12/2011 1204   LYMPHSABS 1.4 01/12/2011 1204   MONOABS 0.3 01/12/2011 1204   EOSABS 0.1 01/12/2011 1204   BASOSABS 0.0 01/12/2011 1204   Abs eos 01/12/11 - 100    Assessment & Plan:   Discussion: 76 year old male former smoker with childhood asthma and moderately severe COPD/asthma overlap who presents for follow-up. Prior COVID infection in 04/2021. Symptomatic with heavy exertion but has good stamina with exercise. Discussed clinical course and management of COPD/asthma including bronchodilator regimen and action plan for exacerbation.  Prior inhalers: Stiolto - not well-controlled Breztri -  intolerance due to thrush even with spacer  Moderate-severe COPD with asthma overlap (FEV 52%) COVID-19 long hauler with dyspnea --CONTINUE Albuterol AS NEEDED for shortness of breath or wheezing. --CONTINUE Duonebs AS NEEDED for severe shortness of breath or wheezing --Encourage regular aerobic exercise  Nasal congestion --START atrovent nasal spray twice a day. --AVOID Afrin  Health Maintenance Immunization History  Administered Date(s) Administered   Fluad Quad(high Dose 65+) 05/20/2022   Influenza Inj Mdck Quad Pf 06/02/2018   Influenza Split 06/29/2008, 07/04/2009, 06/14/2014, 06/25/2014, 05/23/2015, 04/10/2019, 07/08/2021   Influenza Whole 05/24/2009, 10/27/2010   Influenza, High Dose Seasonal PF 07/17/2014, 07/22/2016, 05/28/2017, 06/24/2017, 04/10/2019, 07/03/2021   Influenza,inj,Quad PF,6+ Mos 06/06/2015   PFIZER(Purple Top)SARS-COV-2 Vaccination 10/05/2019, 10/30/2019, 05/20/2020, 12/06/2020   Pneumococcal Conjugate-13 11/05/2014   Pneumococcal Polysaccharide-23 08/06/2011, 04/14/2014   Respiratory Syncytial Virus  Vaccine,Recomb Aduvanted(Arexvy) 05/20/2022   Td 09/01/2005    CT Lung Screen - not qualified  No orders of the defined types were placed in this encounter.  Meds ordered this encounter  Medications   DISCONTD: albuterol (VENTOLIN HFA) 108 (90 Base) MCG/ACT inhaler    Sig: Inhale 2 puffs into the lungs every 6 (six) hours as needed for wheezing or shortness of breath.    Dispense:  8 g    Refill:  0    Generic ok. OK for insurance/patient preferred   albuterol (VENTOLIN HFA) 108 (90 Base) MCG/ACT inhaler    Sig: Inhale 2 puffs into the lungs every 6 (six) hours as needed for wheezing or shortness of breath.    Dispense:  8 g    Refill:  0    Generic ok. OK for insurance/patient preferred   ipratropium (ATROVENT) 0.06 % nasal spray    Sig: Place 2 sprays into both nostrils 4 (four) times daily.    Dispense:  15 mL    Refill:  5    Return in about 6 months (around 11/25/2022).   I have spent a total time of 32-minutes on the day of the appointment including chart review, data review, collecting history, coordinating care and discussing medical diagnosis and plan with the patient/family. Past medical history, allergies, medications were reviewed. Pertinent imaging, labs and tests included in this note have been reviewed and interpreted independently by me  [place], MD [facility] 05/26/2022 10:48 AM  Office Number [phone]

## 2022-06-01 DIAGNOSIS — H401131 Primary open-angle glaucoma, bilateral, mild stage: Secondary | ICD-10-CM | POA: Diagnosis not present

## 2022-07-21 DIAGNOSIS — Z1331 Encounter for screening for depression: Secondary | ICD-10-CM | POA: Diagnosis not present

## 2022-07-21 DIAGNOSIS — Z8546 Personal history of malignant neoplasm of prostate: Secondary | ICD-10-CM | POA: Diagnosis not present

## 2022-07-21 DIAGNOSIS — I2510 Atherosclerotic heart disease of native coronary artery without angina pectoris: Secondary | ICD-10-CM | POA: Diagnosis not present

## 2022-07-21 DIAGNOSIS — R7301 Impaired fasting glucose: Secondary | ICD-10-CM | POA: Diagnosis not present

## 2022-07-21 DIAGNOSIS — J449 Chronic obstructive pulmonary disease, unspecified: Secondary | ICD-10-CM | POA: Diagnosis not present

## 2022-07-21 DIAGNOSIS — Z86010 Personal history of colonic polyps: Secondary | ICD-10-CM | POA: Diagnosis not present

## 2022-07-21 DIAGNOSIS — Z79899 Other long term (current) drug therapy: Secondary | ICD-10-CM | POA: Diagnosis not present

## 2022-07-21 DIAGNOSIS — K219 Gastro-esophageal reflux disease without esophagitis: Secondary | ICD-10-CM | POA: Diagnosis not present

## 2022-07-21 DIAGNOSIS — E7800 Pure hypercholesterolemia, unspecified: Secondary | ICD-10-CM | POA: Diagnosis not present

## 2022-07-21 DIAGNOSIS — Z0000 Encounter for general adult medical examination without abnormal findings: Secondary | ICD-10-CM | POA: Diagnosis not present

## 2022-07-27 ENCOUNTER — Ambulatory Visit: Payer: PPO | Attending: Cardiovascular Disease | Admitting: Cardiovascular Disease

## 2022-07-27 ENCOUNTER — Encounter: Payer: Self-pay | Admitting: Cardiovascular Disease

## 2022-07-27 VITALS — BP 118/78 | HR 66 | Ht 73.0 in | Wt 262.2 lb

## 2022-07-27 DIAGNOSIS — E782 Mixed hyperlipidemia: Secondary | ICD-10-CM | POA: Diagnosis not present

## 2022-07-27 DIAGNOSIS — I2510 Atherosclerotic heart disease of native coronary artery without angina pectoris: Secondary | ICD-10-CM | POA: Diagnosis not present

## 2022-07-27 MED ORDER — ROSUVASTATIN CALCIUM 20 MG PO TABS
20.0000 mg | ORAL_TABLET | Freq: Every day | ORAL | 3 refills | Status: DC
  Filled 2023-04-14: qty 90, 90d supply, fill #0
  Filled 2023-07-08: qty 90, 90d supply, fill #1

## 2022-07-27 NOTE — Patient Instructions (Addendum)
Medication Instructions:  STOP Lipitor  START Crestor 20 mg daily   *If you need a refill on your cardiac medications before your next appointment, please call your pharmacy*   Follow-Up: At [facility], you and your health needs are our priority.  As part of our continuing mission to provide you with exceptional heart care, we have created designated Provider Care Teams.  These Care Teams include your primary Cardiologist (physician) and Advanced Practice Providers (APPs -  Physician Assistants and Nurse Practitioners) who all work together to provide you with the care you need, when you need it.  We recommend signing up for the patient portal called "MyChart".  Sign up information is provided on this After Visit Summary.  MyChart is used to connect with patients for Virtual Visits (Telemedicine).  Patients are able to view lab/test results, encounter notes, upcoming appointments, etc.  Non-urgent messages can be sent to your provider as well.   To learn more about what you can do with MyChart, go to [email or web address].    Your next appointment:   As needed  The format for your next appointment:   In Person  Provider:   Eleonore Chiquito, MD

## 2022-07-27 NOTE — Progress Notes (Signed)
Cardiology Office Note:   Date:  07/27/2022  NAME:  Carlos Romero    MRN: [id number] DOB:  07/18/1946   PCP:  Gaynelle Arabian, MD  Cardiologist:  None  Electrophysiologist:  None   Referring MD: Gaynelle Arabian, MD   Chief Complaint  Patient presents with   Follow-up         History of Present Illness:   Carlos Romero is a 76 y.o. male with a hx of COPD, coronary calcifications, hyperlipidemia who presents for follow-up.  He reports he is doing well.  His blood pressure is well controlled.  He is going to the [facility].  He is doing an exercise bike for 30 minutes 3 days/week.  No issues with this.  He does report frequent urination with Lipitor.  Lipids were well controlled last year.  We discussed switching to Crestor.  He is okay to try this.  He is working with his pulmonologist.  His COPD seems to be well controlled.  No symptoms currently concerning for angina.  He reports no worsening shortness of breath.  No chest discomfort.  Problem List 1. Moderate COPD -20-25-pack-year history 2.  Hyperlipidemia -Total cholesterol 140, HDL 55, LDL 62, triglycerides 132 3. Obesity -BMI 33 4. CAD -coronary calcifications in RCA on CT chest 2019 -Exercise SPECT: Reduced counts in the inferior segments with normal wall motion consistent with diaphragm attenuation, no ischemia 5. PE  Past Medical History: Past Medical History:  Diagnosis Date   Asthma    Barrett's esophagus    Bimalleolar fracture of right ankle    Chronic cough    onset Oct 2010, better after prednisone short course 12-2009, sinus CT 11-06-10 bilateral maxillary, ethmoid and possibly frontal sinusitis>21 days augmentin   COPD (chronic obstructive pulmonary disease) (HCC)    gold stage 2, PFTs 06-12-09 FEV1 2.25 (65%) ratio 62 and no better with B2   Depression 08/24/1997   GERD (gastroesophageal reflux disease)    Hyperlipidemia    Hypertension    Lung disease    LLL air space disease   Prostate cancer  ([place]) 08/24/2001   PUD (peptic ulcer disease) 08/24/1965   s/p perf   Pulmonary embolism ([place]) 08/24/1965   Solitary lung nodule    RML    Past Surgical History: Past Surgical History:  Procedure Laterality Date   ORIF ANKLE FRACTURE Right 08/21/2021   Procedure: OPEN REDUCTION INTERNAL FIXATION (ORIF) ANKLE BIMALLEOLAR FRACTURE AND POSSIBLE SYNDESMOSIS;  Surgeon: Wylene Simmer, MD;  Location: [place];  Service: Orthopedics;  Laterality: Right;  with MAC 60 min   PROSTATECTOMY  08/24/1997   REPAIR OF PERFORATED ULCER  1967    Current Medications: Current Meds  Medication Sig   albuterol (VENTOLIN HFA) 108 (90 Base) MCG/ACT inhaler Inhale 2 puffs into the lungs every 6 (six) hours as needed for wheezing or shortness of breath.   aspirin EC 81 MG tablet Take 1 tablet (81 mg total) by mouth daily. Swallow whole.   chlorpheniramine-HYDROcodone (TUSSIONEX PENNKINETIC ER) 10-8 MG/5ML Take 5 mLs by mouth at bedtime as needed for cough.   diphenhydrAMINE (BENADRYL) 25 MG tablet Take 25 mg by mouth at bedtime as needed for allergies.   ipratropium (ATROVENT) 0.06 % nasal spray Place 2 sprays into both nostrils 4 (four) times daily.   ipratropium-albuterol (DUONEB) 0.5-2.5 (3) MG/3ML SOLN Take 3 mLs by nebulization every 6 (six) hours as needed. TWICE A DAY   latanoprost (XALATAN) 0.005 % ophthalmic solution Place 1 drop  into both eyes at bedtime.   loperamide (IMODIUM A-D) 2 MG tablet Take 2 mg by mouth 4 (four) times daily as needed for diarrhea or loose stools.   losartan (COZAAR) 25 MG tablet Take 1 tablet (25 mg total) by mouth daily.   pantoprazole (PROTONIX) 40 MG tablet TAKE ONE TABLET BY MOUTH ONCE DAILY 30 TO 60 MINUTES BEFORE FIRST MEAL OF THE DAY (Patient taking differently: Take 40 mg by mouth daily.)   rivaroxaban (XARELTO) 10 MG TABS tablet Take 1 tablet (10 mg total) by mouth daily.   rosuvastatin (CRESTOR) 20 MG tablet Take 1 tablet (20 mg total) by mouth  daily.   [DISCONTINUED] atorvastatin (LIPITOR) 40 MG tablet Take 1 tablet (40 mg total) by mouth daily.     Allergies:    Breztri aerosphere [budeson-glycopyrrol-formoterol], Fluoxetine, Fluticasone-umeclidin-vilant, Lansoprazole, Morphine, Other, Venlafaxine, Budesonide, Morphine sulfate, and Prozac [fluoxetine hcl]   Social History: Social History   Socioeconomic History   Marital status: Married    Spouse name: Not on file   Number of children: Not on file   Years of education: Not on file   Highest education level: Not on file  Occupational History   Occupation: Retail buyer: Retired  Tobacco Use   Smoking status: Former    Packs/day: 1.00    Years: 25.00    Total pack years: 25.00    Types: Cigarettes    Start date: 08/1960    Quit date: 08/24/1986    Years since quitting: 35.9   Smokeless tobacco: Never  Vaping Use   Vaping Use: Never used  Substance and Sexual Activity   Alcohol use: Yes    Alcohol/week: 0.0 standard drinks of alcohol    Comment: beer once in a while    Drug use: Never   Sexual activity: Not on file  Other Topics Concern   Not on file  Social History Narrative   Not on file   Social Determinants of Health   Financial Resource Strain: Not on file  Food Insecurity: Not on file  Transportation Needs: Not on file  Physical Activity: Not on file  Stress: Not on file  Social Connections: Not on file     Family History: The patient's family history includes Asthma in his mother; Cancer in his mother; Emphysema in an other family member; Stroke in his mother.  ROS:   All other ROS reviewed and negative. Pertinent positives noted in the HPI.     EKGs/Labs/Other Studies Reviewed:   The following studies were personally reviewed by me today:  Recent Labs: No results found for requested labs within last 365 days.   Recent Lipid Panel    Component Value Date/Time   CHOL 191 04/05/2020 0957   TRIG 163 (H) 04/05/2020 0957   HDL  49 04/05/2020 0957   CHOLHDL 3.9 04/05/2020 0957   LDLCALC 113 (H) 04/05/2020 0957    Physical Exam:   VS:  BP 118/78   Pulse 66   Ht _0  (1.854 m)   Wt 262 lb 3.2 oz (118.9 kg)   SpO2 95%   BMI 34.59 kg/m    Wt Readings from Last 3 Encounters:  07/27/22 262 lb 3.2 oz (118.9 kg)  05/26/22 262 lb (118.8 kg)  12/15/21 259 lb 12.8 oz (117.8 kg)    General: Well nourished, well developed, in no acute distress Head: Atraumatic, normal size  Eyes: PEERLA, EOMI  Neck: Supple, no JVD Endocrine: No thryomegaly Cardiac: Normal S1, S2;  RRR; no murmurs, rubs, or gallops Lungs: Clear to auscultation bilaterally, no wheezing, rhonchi or rales  Abd: Soft, nontender, no hepatomegaly  Ext: No edema, pulses 2+ Musculoskeletal: No deformities, BUE and BLE strength normal and equal Skin: Warm and dry, no rashes   Neuro: Alert and oriented to person, place, time, and situation, CNII-XII grossly intact, no focal deficits  Psych: Normal mood and affect   ASSESSMENT:   Lovis More is a 76 y.o. male who presents for the following: 1. Coronary artery calcification seen on computed tomography   2. Mixed hyperlipidemia     PLAN:   1. Coronary artery calcification seen on computed tomography 2. Mixed hyperlipidemia -Known history of coronary calcifications.  Heavy smoking history.  Not currently smoking.  Underwent a stress test that was unremarkable.  No symptoms concerning for angina.  Echocardiogram unremarkable.  Would continue with good preventative practice.  He will continue aspirin 81 mg daily.  He reports frequent urination with Lipitor.  We will try Crestor 20 mg daily.  All of his numbers are well controlled.  He will see [place] back as needed.  Disposition: Return if symptoms worsen or fail to improve.  Medication Adjustments/Labs and Tests Ordered: Current medicines are reviewed at length with the patient today.  Concerns regarding medicines are outlined above.  No orders of the  defined types were placed in this encounter.  Meds ordered this encounter  Medications   rosuvastatin (CRESTOR) 20 MG tablet    Sig: Take 1 tablet (20 mg total) by mouth daily.    Dispense:  90 tablet    Refill:  3    Patient Instructions  Medication Instructions:  STOP Lipitor  START Crestor 20 mg daily   *If you need a refill on your cardiac medications before your next appointment, please call your pharmacy*   Follow-Up: At [facility], you and your health needs are our priority.  As part of our continuing mission to provide you with exceptional heart care, we have created designated Provider Care Teams.  These Care Teams include your primary Cardiologist (physician) and Advanced Practice Providers (APPs -  Physician Assistants and Nurse Practitioners) who all work together to provide you with the care you need, when you need it.  We recommend signing up for the patient portal called "MyChart".  Sign up information is provided on this After Visit Summary.  MyChart is used to connect with patients for Virtual Visits (Telemedicine).  Patients are able to view lab/test results, encounter notes, upcoming appointments, etc.  Non-urgent messages can be sent to your provider as well.   To learn more about what you can do with MyChart, go to [email or web address].    Your next appointment:   As needed  The format for your next appointment:   In Person  Provider:   Eleonore Chiquito, MD          Time Spent with Patient: I have spent a total of 25 minutes with patient reviewing hospital notes, telemetry, EKGs, labs and examining the patient as well as establishing an assessment and plan that was discussed with the patient.  > 50% of time was spent in direct patient care.  Signed, Addison Naegeli. Audie Box, MD, [place]  [address], [place], [place] [address] [phone]  07/27/2022 9:00 AM

## 2022-08-05 IMAGING — DX DG ANKLE PORT 2V*R*
1 series · 1 of 1 positions shown · non-contrast
Comparison: Portable exam 3856 hours compared to earlier study of
08/15/2021

CLINICAL DATA: Closed reduction

EXAM:
PORTABLE RIGHT ANKLE - 1 VIEW

[ankle ap]
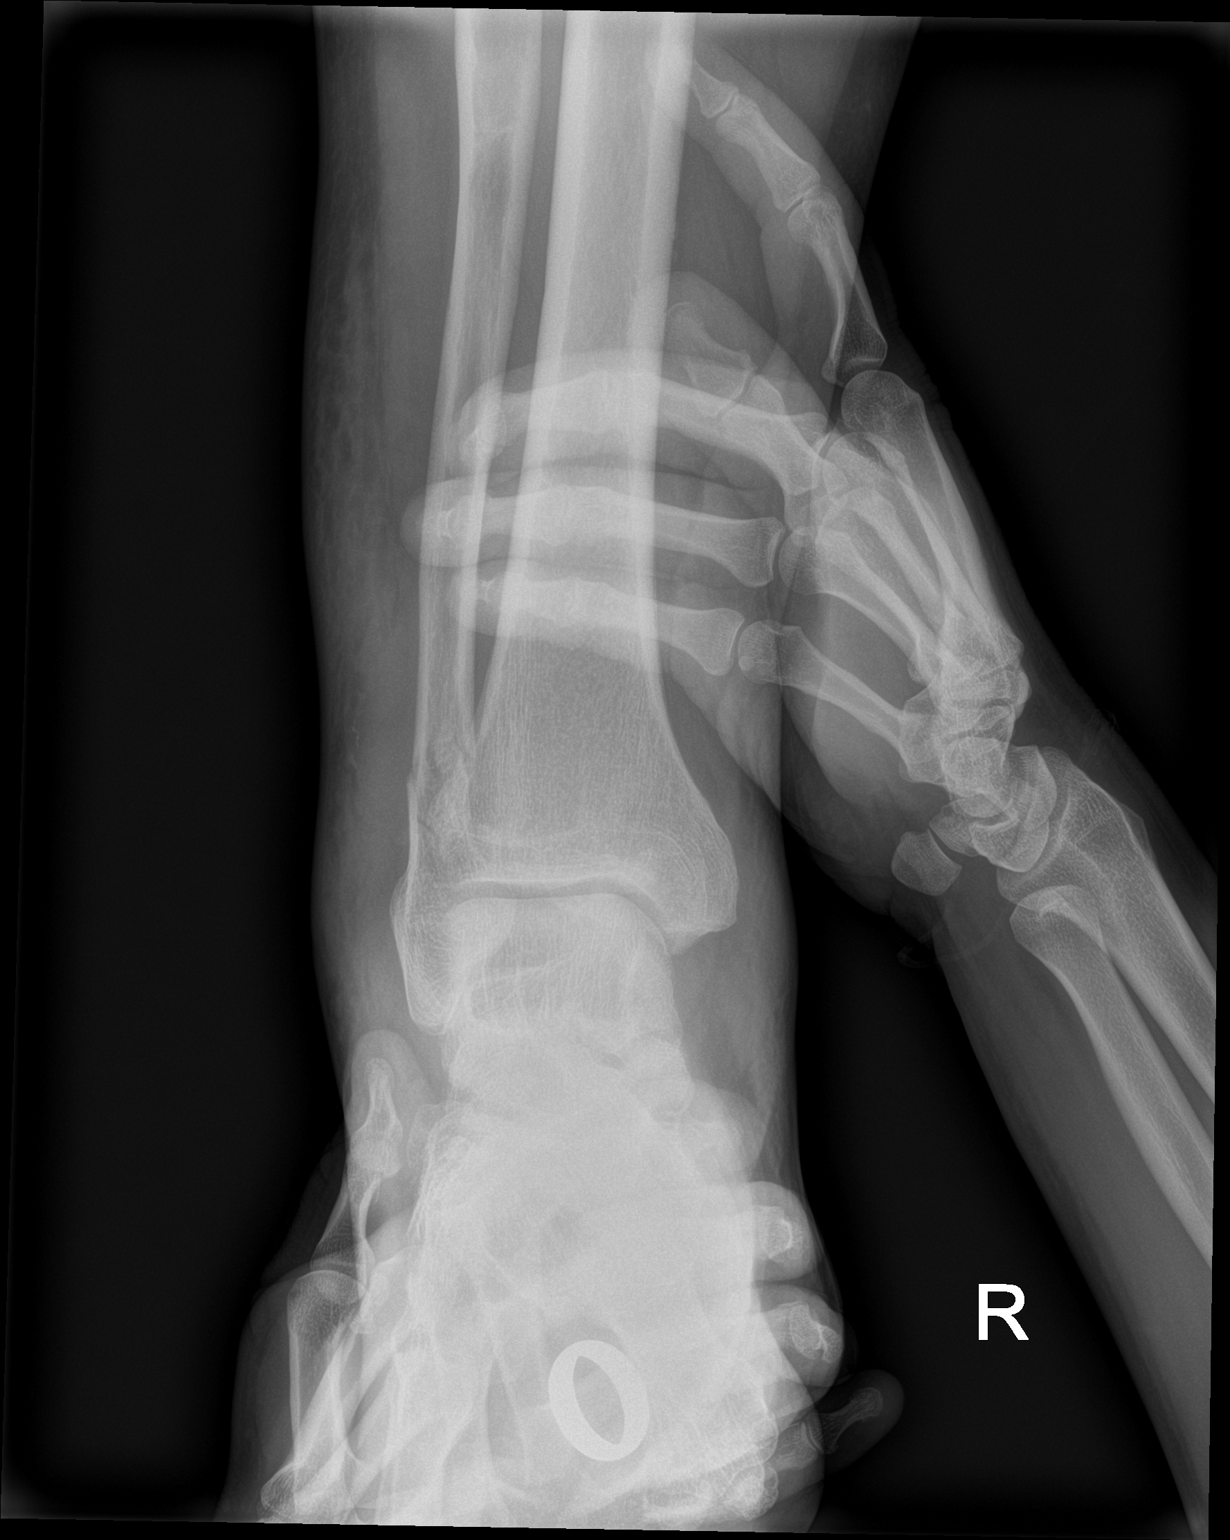

[1 of 1 positions shown; findings below may reference images not displayed]

FINDINGS: Improved alignment of lateral malleolar fracture versus previous
exam.

Extensive soft tissue swelling.

Joint space preserved.

Exam limited to single AP view, no lateral view obtained.
IMPRESSION: Improved alignment of oblique lateral malleolar fracture RIGHT ankle
since earlier study.

## 2022-08-13 ENCOUNTER — Other Ambulatory Visit (HOSPITAL_BASED_OUTPATIENT_CLINIC_OR_DEPARTMENT_OTHER): Payer: Self-pay | Admitting: Pulmonary Disease

## 2022-08-13 ENCOUNTER — Other Ambulatory Visit: Payer: Self-pay | Admitting: Pulmonary Disease

## 2022-08-13 ENCOUNTER — Ambulatory Visit (INDEPENDENT_AMBULATORY_CARE_PROVIDER_SITE_OTHER): Payer: PPO | Admitting: Pulmonary Disease

## 2022-08-13 ENCOUNTER — Encounter (HOSPITAL_BASED_OUTPATIENT_CLINIC_OR_DEPARTMENT_OTHER): Payer: Self-pay | Admitting: Pulmonary Disease

## 2022-08-13 VITALS — BP 128/62 | HR 79 | Ht 73.0 in | Wt 261.6 lb

## 2022-08-13 DIAGNOSIS — J441 Chronic obstructive pulmonary disease with (acute) exacerbation: Secondary | ICD-10-CM | POA: Diagnosis not present

## 2022-08-13 DIAGNOSIS — J4489 Other specified chronic obstructive pulmonary disease: Secondary | ICD-10-CM

## 2022-08-13 MED ORDER — AZITHROMYCIN 250 MG PO TABS: ORAL_TABLET | ORAL | 0 refills | Status: DC

## 2022-08-13 MED ORDER — HYDROCOD POLI-CHLORPHE POLI ER 10-8 MG/5ML PO SUER: 5.0000 mL | Freq: Every evening | ORAL | 0 refills | Status: DC | PRN

## 2022-08-13 MED ORDER — PREDNISONE 10 MG PO TABS: 40.0000 mg | ORAL_TABLET | Freq: Every day | ORAL | 0 refills | Status: DC

## 2022-08-13 NOTE — Telephone Encounter (Signed)
Cough syrup refilled 

## 2022-08-13 NOTE — Progress Notes (Signed)
Subjective:   PATIENT ID: Carlos Romero GENDER: male DOB: 1945/12/20, MRN: [id number]    Chief Complaint  Patient presents with   Follow-up    In the middle of an exacerbation coughed all night long    Reason for Visit: Follow-up  [name] is a 76 year old male former smoker with COPD, COVID-19 in 04/2021, hx childhood asthma, prostate cancer s/p prostatectomy in 1999 and radiation in 2002 and solitary kidney who presents for follow-up.  Synopsis: Since his diagnosis of COVID 04/2021, he reports that he has decreased stamina and shortness of breath with activity. He previously went the the gym 4-5 times a week, using treadmill and machines. Walking upstairs is difficult. Uses albuterol once a week. Minimal cough usually during the night. Uses cough syrup twice a week. Did not tolerate Breztri. On Duonebs TID for COPD maintenance. 2022 - COVID in Sept. Treated for COPD exacerbation in Dec 2023 - Ankle surgery>back to exercising 3x/week. Not on maintenance inhalers. PRN Duonbs and albuterol. Treated for COPD exacerbation in Dec  08/13/22 He reports he started have shortness of breath x 1 day. Associated with wheezing and productive cough with clear sputum. Denies fevers, chills. No sick contacts. Prior to his illness he reports his health was overall stable, he has not needed nebulizer except he was ill this morning. He was going to the gym 3 times a week. Not on maintenance inhalers.  Social History: Quit smoking 30 years ago.  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  Past Medical History:  Diagnosis Date   Asthma    Barrett's esophagus    Bimalleolar fracture of right ankle    Chronic cough    onset Oct 2010, better after prednisone short course 12-2009, sinus CT 11-06-10 bilateral maxillary, ethmoid and possibly frontal sinusitis>21 days augmentin   COPD (chronic obstructive pulmonary disease) (HCC)    gold stage 2, PFTs 06-12-09 FEV1 2.25 (65%) ratio 62 and no better with B2    Depression 08/24/1997   GERD (gastroesophageal reflux disease)    Hyperlipidemia    Hypertension    Lung disease    LLL air space disease   Prostate cancer ([place]) 08/24/2001   PUD (peptic ulcer disease) 08/24/1965   s/p perf   Pulmonary embolism ([place]) 08/24/1965   Solitary lung nodule    RML     Allergies  Allergen Reactions   Breztri Aerosphere [Budeson-Glycopyrrol-Formoterol] Cough   Fluoxetine Other (See Comments)    Vivid dreams   Fluticasone-Umeclidin-Vilant     Other reaction(s): increased cough   Lansoprazole     Other reaction(s): diarrhea   Morphine     REACTION: "feels crazy"   Other     Other reaction(s): vivid dreams   Venlafaxine     Other reaction(s): Other (See Comments)   Budesonide Other (See Comments)    Thrush   Morphine Sulfate Anxiety   Prozac [Fluoxetine Hcl] Anxiety     Outpatient Medications Prior to Visit  Medication Sig Dispense Refill   albuterol (VENTOLIN HFA) 108 (90 Base) MCG/ACT inhaler Inhale 2 puffs into the lungs every 6 (six) hours as needed for wheezing or shortness of breath. 8 g 0   aspirin EC 81 MG tablet Take 1 tablet (81 mg total) by mouth daily. Swallow whole. 30 tablet 11   diphenhydrAMINE (BENADRYL) 25 MG tablet Take 25 mg by mouth at bedtime as needed for allergies.     ipratropium (ATROVENT) 0.06 % nasal spray Place 2 sprays into  both nostrils 4 (four) times daily. 15 mL 5   ipratropium-albuterol (DUONEB) 0.5-2.5 (3) MG/3ML SOLN Take 3 mLs by nebulization every 6 (six) hours as needed. TWICE A DAY 1080 mL 2   latanoprost (XALATAN) 0.005 % ophthalmic solution Place 1 drop into both eyes at bedtime.     loperamide (IMODIUM A-D) 2 MG tablet Take 2 mg by mouth 4 (four) times daily as needed for diarrhea or loose stools.     losartan (COZAAR) 25 MG tablet Take 1 tablet (25 mg total) by mouth daily. 90 tablet 3   oxyCODONE-acetaminophen (PERCOCET/ROXICET) 5-325 MG tablet Take 1 tablet by mouth every 6 (six) hours as needed for severe  pain. 15 tablet 0   pantoprazole (PROTONIX) 40 MG tablet TAKE ONE TABLET BY MOUTH ONCE DAILY 30 TO 60 MINUTES BEFORE FIRST MEAL OF THE DAY (Patient taking differently: Take 40 mg by mouth daily.) 90 tablet 0   rivaroxaban (XARELTO) 10 MG TABS tablet Take 1 tablet (10 mg total) by mouth daily. 14 tablet 0   rosuvastatin (CRESTOR) 20 MG tablet Take 1 tablet (20 mg total) by mouth daily. 90 tablet 3   chlorpheniramine-HYDROcodone (TUSSIONEX PENNKINETIC ER) 10-8 MG/5ML Take 5 mLs by mouth at bedtime as needed for cough. 473 mL 0   No facility-administered medications prior to visit.    Review of Systems  Constitutional:  Negative for chills, diaphoresis, fever, malaise/fatigue and weight loss.  HENT:  Positive for congestion.   Respiratory:  Positive for cough, shortness of breath and wheezing. Negative for hemoptysis and sputum production.   Cardiovascular:  Negative for chest pain, palpitations and leg swelling.     Objective:   Vitals:   08/13/22 1456  BP: 128/62  Pulse: 79  SpO2: 96%  Weight: 261 lb 9.6 oz (118.7 kg)  Height: '6\' 1"'$  (1.854 m)    SpO2: 96 % O2 Device: None (Room air)  Physical Exam: General: Well-appearing, no acute distress HENT: [place], AT Eyes: EOMI, no scleral icterus Respiratory: Diminished to auscultation bilaterally.  No crackles, wheezing or rales Cardiovascular: RRR, -M/R/G, no JVD Extremities:-Edema,-tenderness Neuro: AAO x4, CNII-XII grossly intact Psych: Normal mood, normal affect  Data Reviewed:  Imaging: CT Chest 07/05/18 - Minimal hazy mosaic attenuation throughout. No pulmonary nodules noted.  PFT: 05/24/20 FVC 2.8 (57%) FEV1 1.9 (52%) Ratio 67  Interpretation: Moderately severe obstructive defect  Labs: CBC    Component Value Date/Time   WBC 4.0 (L) 01/12/2011 1204   RBC 4.28 01/12/2011 1204   HGB 13.6 01/12/2011 1204   HCT 39.1 01/12/2011 1204   PLT 195.0 01/12/2011 1204   MCV 91.3 01/12/2011 1204   MCH 31.0 11/15/2010 1845    MCHC 34.8 01/12/2011 1204   RDW 14.2 01/12/2011 1204   LYMPHSABS 1.4 01/12/2011 1204   MONOABS 0.3 01/12/2011 1204   EOSABS 0.1 01/12/2011 1204   BASOSABS 0.0 01/12/2011 1204   Abs eos 01/12/11 - 100    Assessment & Plan:   Discussion: 76 year old male former smoker with childhood asthma and moderately severe COPD/asthma overlap who presents for follow-up. In mild COPD exacerbation. Prior to this well controlled on PRN bronchodilators. Has exacerbation once a year  Prior inhalers: Stiolto - not well-controlled Breztri - intolerance due to thrush even with spacer  COPD exacerbation --Prednisone 40 mg x 5 days --Azithromycin x 5 days as directed --Cough syrup ordered  Moderate-severe COPD with asthma overlap (FEV 52%) COVID-19 long hauler with dyspnea --CONTINUE Albuterol AS NEEDED for shortness of  breath or wheezing. --CONTINUE Duonebs AS NEEDED for severe shortness of breath or wheezing --Encourage regular aerobic exercise  Nasal congestion --CONTINUE atrovent nasal spray twice a day. --AVOID Afrin  Health Maintenance Immunization History  Administered Date(s) Administered   Fluad Quad(high Dose 65+) 05/20/2022   Influenza Inj Mdck Quad Pf 06/02/2018   Influenza Split 06/29/2008, 07/04/2009, 06/14/2014, 06/25/2014, 05/23/2015, 04/10/2019, 07/08/2021   Influenza Whole 05/24/2009, 10/27/2010   Influenza, High Dose Seasonal PF 07/17/2014, 07/22/2016, 05/28/2017, 06/24/2017, 04/10/2019, 07/03/2021   Influenza,inj,Quad PF,6+ Mos 06/06/2015   PFIZER(Purple Top)SARS-COV-2 Vaccination 10/05/2019, 10/30/2019, 05/20/2020, 12/06/2020   Pneumococcal Conjugate-13 11/05/2014   Pneumococcal Polysaccharide-23 08/06/2011, 04/14/2014   Respiratory Syncytial Virus Vaccine,Recomb Aduvanted(Arexvy) 05/20/2022   Td 09/01/2005   Unspecified SARS-COV-2 Vaccination 06/04/2022    CT Lung Screen - not qualified  No orders of the defined types were placed in this encounter.  Meds ordered  this encounter  Medications   predniSONE (DELTASONE) 10 MG tablet    Sig: Take 4 tablets (40 mg total) by mouth daily with breakfast for 5 days.    Dispense:  5 tablet    Refill:  0   azithromycin (ZITHROMAX) 250 MG tablet    Sig: Take two tablets on day 1, then one tablet daily on day 2-5.    Dispense:  6 each    Refill:  0    Return in about 6 months (around 02/12/2023).   I have spent a total time of 32-minutes on the day of the appointment including chart review, data review, collecting history, coordinating care and discussing medical diagnosis and plan with the patient/family. Past medical history, allergies, medications were reviewed. Pertinent imaging, labs and tests included in this note have been reviewed and interpreted independently by me.  [place], MD [facility] 08/13/2022 3:27 PM  Office Number [phone]

## 2022-08-13 NOTE — Telephone Encounter (Signed)
Please advise on refill request

## 2022-08-13 NOTE — Patient Instructions (Addendum)
COPD exacerbation --Prednisone 40 mg x 5 days --Azithromycin x 5 days as directed --Cough syrup refilled  Moderate-severe COPD with asthma overlap (FEV 52%) COVID-19 long hauler with dyspnea --CONTINUE Albuterol AS NEEDED for shortness of breath or wheezing. --CONTINUE Duonebs AS NEEDED for severe shortness of breath or wheezing --Encourage regular aerobic exercise  Follow-up with me in 6 months

## 2022-09-10 MED ORDER — HYDROCOD POLI-CHLORPHE POLI ER 10-8 MG/5ML PO SUER: 5.0000 mL | Freq: Every evening | ORAL | 0 refills | Status: DC | PRN

## 2022-09-10 NOTE — Addendum Note (Signed)
Addended by: Rodman Pickle on: 09/10/2022 09:35 AM   Modules accepted: Orders

## 2022-09-11 ENCOUNTER — Telehealth: Payer: Self-pay | Admitting: Pulmonary Disease

## 2022-09-11 NOTE — Telephone Encounter (Signed)
Patient called because he stated that his pharmacy did not have the order for a cough medicine that he said the doctor was supposed to send in.  Please advise and call patient to discuss further.  CB# [phone]

## 2022-09-15 NOTE — Telephone Encounter (Signed)
Called and spoke with pt to see if he was able to get the Tussionex that Dr. Loanne Drilling prescribed and he said that he had not as it was on backorder at the pharmacy and they were not sure when they would be getting it back in.  Pharmacy stated to pt to see if there was an alternative cough med that could be prescribed instead.  Dr. Loanne Drilling, please advise on this for pt.

## 2022-09-16 MED ORDER — GUAIFENESIN-CODEINE 100-6.33 MG/5ML PO SOLN: 5.0000 mL | Freq: Two times a day (BID) | ORAL | 0 refills | Status: DC | PRN

## 2022-09-16 NOTE — Telephone Encounter (Signed)
DC Tussionex.  Guaifenesin codeine ordered.

## 2022-10-01 DIAGNOSIS — H401131 Primary open-angle glaucoma, bilateral, mild stage: Secondary | ICD-10-CM | POA: Diagnosis not present

## 2022-10-06 ENCOUNTER — Ambulatory Visit: Payer: PPO | Admitting: Podiatry

## 2022-10-06 VITALS — BP 138/88 | HR 79

## 2022-10-06 DIAGNOSIS — B351 Tinea unguium: Secondary | ICD-10-CM | POA: Diagnosis not present

## 2022-10-06 DIAGNOSIS — M79674 Pain in right toe(s): Secondary | ICD-10-CM

## 2022-10-06 DIAGNOSIS — M79675 Pain in left toe(s): Secondary | ICD-10-CM | POA: Diagnosis not present

## 2022-10-06 MED ORDER — CICLOPIROX 8 % EX SOLN: Freq: Every day | CUTANEOUS | 3 refills | Status: DC

## 2022-10-06 NOTE — Progress Notes (Signed)
  Subjective:  Patient ID: Carlos Romero, male    DOB: 1946/04/01,  MRN: [id number]  Chief Complaint  Patient presents with   Nail Problem    Patient reports a thick, painful discolored toenail on the left foot    77 y.o. male presents with the above complaint. History confirmed with patient.  The left third digit nail is the worst but most of the toenails are thickened elongated and causing discomfort  Objective:  Physical Exam: warm, good capillary refill, no trophic changes or ulcerative lesions, normal DP and PT pulses, and normal sensory exam. Left Foot: dystrophic yellowed discolored nail plates with subungual debris Right Foot: dystrophic yellowed discolored nail plates with subungual debris  Assessment:   1. Pain due to onychomycosis of toenails of both feet      Plan:  Patient was evaluated and treated and all questions answered.  Discussed the etiology and treatment options for the condition in detail with the patient. Educated patient on the topical and oral treatment options for mycotic nails. Recommended debridement of the nails today. Sharp and mechanical debridement performed of all painful and mycotic nails today. Nails debrided in length and thickness using a nail nipper to level of comfort. Discussed treatment options including appropriate shoe gear. Follow up as needed for painful nails.  Rx Penlac sent to pharmacy, we discussed its use    No follow-ups on file.

## 2022-10-22 ENCOUNTER — Encounter: Payer: Self-pay | Admitting: Radiology

## 2022-11-11 ENCOUNTER — Ambulatory Visit: Payer: PPO | Admitting: Cardiovascular Disease

## 2022-11-12 ENCOUNTER — Encounter (INDEPENDENT_AMBULATORY_CARE_PROVIDER_SITE_OTHER): Payer: PPO | Admitting: Pulmonary Disease

## 2022-11-12 DIAGNOSIS — J441 Chronic obstructive pulmonary disease with (acute) exacerbation: Secondary | ICD-10-CM | POA: Diagnosis not present

## 2022-11-12 MED ORDER — DOXYCYCLINE HYCLATE 100 MG PO TABS: 100.0000 mg | ORAL_TABLET | Freq: Two times a day (BID) | ORAL | 0 refills | Status: DC

## 2022-11-12 MED ORDER — PREDNISONE 20 MG PO TABS: 40.0000 mg | ORAL_TABLET | Freq: Every day | ORAL | 0 refills | Status: AC

## 2022-11-12 MED ORDER — HYDROCOD POLI-CHLORPHE POLI ER 10-8 MG/5ML PO SUER: 5.0000 mL | Freq: Two times a day (BID) | ORAL | 0 refills | Status: DC | PRN

## 2022-11-12 NOTE — Telephone Encounter (Signed)
Virtual Visit via Telephone Note  I connected with [name] on 11/12/22 at  by telephone and verified that I am speaking with the correct person using two identifiers.  Location: Patient: Home Provider: [facility]   I discussed the limitations, risks, security and privacy concerns of performing an evaluation and management service by telephone and the availability of in person appointments. I also discussed with the patient that there may be a patient responsible charge related to this service. The patient expressed understanding and agreed to proceed.   History of Present Illness: 77 year old male former smoker with COPD/asthma who presents for COPD exacerbation.  Reports worsening cough and wheezing for the last few days. Associated with chest congestion. Compliant with inhalers. Current cough syrup is not as effective. Now unable to sleep. Denies fevers, chills or recent sick contacts   Observations/Objective: Not in respiratory distress on telephone  Assessment and Plan: COPD exacerbation --ORDER Prednisone 40 mg daily --ORDER Doxycycline  --Tussionex ordered    Follow Up Instructions:    I discussed the assessment and treatment plan with the patient. The patient was provided an opportunity to ask questions and all were answered. The patient agreed with the plan and demonstrated an understanding of the instructions.   The patient was advised to call back or seek an in-person evaluation if the symptoms worsen or if the condition fails to improve as anticipated.  I provided 25 minutes of non-face-to-face time during this encounter.    Rodman Pickle, MD

## 2022-11-12 NOTE — Telephone Encounter (Signed)
Mychart message sent by pt:  Carlos Romero "Carlos Romero"  P [facility] (supporting Chi Rodman Pickle, MD)1 hour ago (12:36 PM)    Dr.Ellison, for the last few weeks I've been coughing and wheezing a lot, when not coughing my chest seems to rattle constantly and I'm really finding it hard to sleep. The last cough syrup does not seem to do the job the other I used to have.  HELP BR Cathleen Fears     Dr. Loanne Drilling, please advise.

## 2022-11-18 ENCOUNTER — Other Ambulatory Visit (HOSPITAL_BASED_OUTPATIENT_CLINIC_OR_DEPARTMENT_OTHER): Payer: Self-pay

## 2022-11-18 ENCOUNTER — Telehealth: Payer: Self-pay | Admitting: Pulmonary Disease

## 2022-11-18 MED ORDER — HYDROCOD POLI-CHLORPHE POLI ER 10-8 MG/5ML PO SUER
5.0000 mL | Freq: Every evening | ORAL | 0 refills | Status: DC | PRN
  Filled 2022-11-18: qty 473, 94d supply, fill #0

## 2022-11-18 NOTE — Telephone Encounter (Signed)
Tussionex available at [facility].  Patient ok picking up at this location. Order placed. Will close encounter.

## 2022-11-18 NOTE — Telephone Encounter (Signed)
PT's wife calling and states she found a pharm that has the cough syrup Dr. Johnette Abraham [facility]. [facility] Pharm is:  [facility] [address]  Everyone else has been out of it.  The pharm has sent in a req but she wanted to back it up with a call.  Pls call if needed to advise. [phone]

## 2022-11-19 ENCOUNTER — Telehealth (HOSPITAL_BASED_OUTPATIENT_CLINIC_OR_DEPARTMENT_OTHER): Payer: Self-pay | Admitting: Pulmonary Disease

## 2022-11-19 ENCOUNTER — Other Ambulatory Visit (HOSPITAL_BASED_OUTPATIENT_CLINIC_OR_DEPARTMENT_OTHER): Payer: Self-pay

## 2022-11-19 MED ORDER — HYDROCOD POLI-CHLORPHE POLI ER 10-8 MG/5ML PO SUER: 5.0000 mL | Freq: Every evening | ORAL | 0 refills | Status: DC | PRN

## 2022-11-19 NOTE — Telephone Encounter (Signed)
Called and left patient a detailed voicemail that Dr Loanne Drilling sent in cough medication to [facility] [address]. Nothing further needed

## 2022-11-19 NOTE — Telephone Encounter (Signed)
Called patient and he states that he wishes for the Tussinex cough medication to be sent into [facility] [address].  Please advise Dr Loanne Drilling

## 2022-11-19 NOTE — Telephone Encounter (Signed)
Tussionex generic has been ordered to preferred pharmacy - [facility] [address] in [place]

## 2022-11-19 NOTE — Telephone Encounter (Signed)
Called [facility] [address] and they do carry the generic Tussionex.

## 2022-11-19 NOTE — Telephone Encounter (Signed)
Pt came in person stating he and Dr. Loanne Drilling have been trying to find a pharmacy that has tussionex cough syrup and pt sates it is available but downstairs was $280. [facility] [address] and pt wishes to have it filled there. Please advise and call pt with an update pt phone [phone].  [facility] [address] P: [phone]

## 2022-11-19 NOTE — Telephone Encounter (Signed)
Please contact pharmacy on [address] to ensure they have tussionex

## 2023-02-01 DIAGNOSIS — H401131 Primary open-angle glaucoma, bilateral, mild stage: Secondary | ICD-10-CM | POA: Diagnosis not present

## 2023-02-09 ENCOUNTER — Other Ambulatory Visit: Payer: Self-pay

## 2023-02-09 DIAGNOSIS — I517 Cardiomegaly: Secondary | ICD-10-CM

## 2023-02-09 DIAGNOSIS — I5189 Other ill-defined heart diseases: Secondary | ICD-10-CM

## 2023-02-09 MED ORDER — LOSARTAN POTASSIUM 25 MG PO TABS
25.0000 mg | ORAL_TABLET | Freq: Every day | ORAL | 1 refills | Status: DC
  Filled 2023-02-12: qty 90, 90d supply, fill #0
  Filled 2023-04-14 – 2023-04-23 (×2): qty 90, 90d supply, fill #1

## 2023-02-10 ENCOUNTER — Other Ambulatory Visit (HOSPITAL_BASED_OUTPATIENT_CLINIC_OR_DEPARTMENT_OTHER): Payer: Self-pay

## 2023-02-10 MED ORDER — AZELASTINE HCL 137 MCG/SPRAY NA SOLN: 137.0000 ug | NASAL | 6 refills | Status: DC

## 2023-02-10 MED ORDER — LATANOPROST 0.005 % OP SOLN
OPHTHALMIC | 3 refills | Status: DC
  Filled 2023-04-14: qty 2.5, 30d supply, fill #0
  Filled 2023-06-25: qty 2.5, 30d supply, fill #1
  Filled 2023-09-13: qty 2.5, 30d supply, fill #2
  Filled 2023-11-15: qty 2.5, 30d supply, fill #3

## 2023-02-10 MED ORDER — VALACYCLOVIR HCL 1 G PO TABS: 2000.0000 mg | ORAL_TABLET | ORAL | 1 refills | Status: AC

## 2023-02-13 ENCOUNTER — Other Ambulatory Visit (HOSPITAL_BASED_OUTPATIENT_CLINIC_OR_DEPARTMENT_OTHER): Payer: Self-pay

## 2023-02-17 ENCOUNTER — Other Ambulatory Visit (HOSPITAL_BASED_OUTPATIENT_CLINIC_OR_DEPARTMENT_OTHER): Payer: Self-pay

## 2023-02-17 ENCOUNTER — Encounter (HOSPITAL_BASED_OUTPATIENT_CLINIC_OR_DEPARTMENT_OTHER): Payer: Self-pay | Admitting: Pulmonary Disease

## 2023-02-17 ENCOUNTER — Ambulatory Visit (HOSPITAL_BASED_OUTPATIENT_CLINIC_OR_DEPARTMENT_OTHER): Payer: PPO | Admitting: Pulmonary Disease

## 2023-02-17 VITALS — BP 128/72 | HR 69 | Ht 73.0 in | Wt 262.0 lb

## 2023-02-17 DIAGNOSIS — J4489 Other specified chronic obstructive pulmonary disease: Secondary | ICD-10-CM | POA: Diagnosis not present

## 2023-02-17 DIAGNOSIS — R0981 Nasal congestion: Secondary | ICD-10-CM

## 2023-02-17 MED ORDER — ANORO ELLIPTA 62.5-25 MCG/ACT IN AEPB
1.0000 | INHALATION_SPRAY | Freq: Every day | RESPIRATORY_TRACT | 5 refills | Status: DC
  Filled 2023-02-17: qty 60, 30d supply, fill #0
  Filled 2023-03-15: qty 60, 30d supply, fill #1
  Filled 2023-04-20: qty 60, 30d supply, fill #2
  Filled 2023-05-18: qty 60, 30d supply, fill #3
  Filled 2023-06-14: qty 60, 30d supply, fill #4
  Filled 2023-07-15: qty 60, 30d supply, fill #5

## 2023-02-17 NOTE — Patient Instructions (Addendum)
Moderate-severe COPD with asthma overlap (FEV 52%) COVID-19 long hauler with dyspnea --START Anoro ONE puff ONCE a day --CONTINUE Albuterol AS NEEDED for shortness of breath or wheezing. --CONTINUE Duonebs AS NEEDED for severe shortness of breath or wheezing --Encourage regular aerobic exercise  Nasal congestion --START zyrtec (generic: cetirizine) once a day --Recommend atrovent nasal spray twice a day as needed --AVOID Afrin

## 2023-02-17 NOTE — Progress Notes (Signed)
Subjective:   PATIENT ID: Carlos Romero GENDER: male DOB: 1945/10/31, MRN: [id number]    Chief Complaint  Patient presents with   Follow-up    F/U for COPD/asthma. States his breathing has been stable since last visit. Denies any new concerns.    Reason for Visit: Follow-up  [name] is a 77 year old male former smoker with COPD, COVID-19 in 04/2021, hx childhood asthma, prostate cancer s/p prostatectomy in 1999 and radiation in 2002 and solitary kidney who presents for follow-up.  Synopsis: Since his diagnosis of COVID 04/2021, he reports that he has decreased stamina and shortness of breath with activity. He previously went the the gym 4-5 times a week, using treadmill and machines. Walking upstairs is difficult. Uses albuterol once a week. Minimal cough usually during the night. Uses cough syrup twice a week. Did not tolerate Breztri. On Duonebs TID for COPD maintenance. 2022 - COVID in Sept. Treated for COPD exacerbation in Dec 2023 - Ankle surgery>back to exercising 3x/week. Not on maintenance inhalers. PRN Duonbs and albuterol. Treated for COPD exacerbation in Dec  08/13/22 He reports he started have shortness of breath x 1 day. Associated with wheezing and productive cough with clear sputum. Denies fevers, chills. No sick contacts. Prior to his illness he reports his health was overall stable, he has not needed nebulizer except he was ill this morning. He was going to the gym 3 times a week. Not on maintenance inhalers.  02/17/23 Since our last visit he had a COPD exacerbation on 11/12/22. Feels better. Using albuterol once a month for wheezing at night. Not needed nebulizer since March.Chronic cough with clear sputum. Does use cough syrup once a week for night symptoms. Seems like laying down will trigger it. Does have nasal congestion and takes benadryl on most nights with improvement. Not using atrovent spray. Enjoys his neti pot. Going to gym 3-4 times a week and does  cardio and rowing for 1 hour total.  Social History: Quit smoking 30 years ago.  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  Past Medical History:  Diagnosis Date   Asthma    Barrett's esophagus    Bimalleolar fracture of right ankle    Chronic cough    onset Oct 2010, better after prednisone short course 12-2009, sinus CT 11-06-10 bilateral maxillary, ethmoid and possibly frontal sinusitis>21 days augmentin   COPD (chronic obstructive pulmonary disease) (HCC)    gold stage 2, PFTs 06-12-09 FEV1 2.25 (65%) ratio 62 and no better with B2   Depression 08/24/1997   GERD (gastroesophageal reflux disease)    Hyperlipidemia    Hypertension    Lung disease    LLL air space disease   Prostate cancer (HCC) 08/24/2001   PUD (peptic ulcer disease) 08/24/1965   s/p perf   Pulmonary embolism (HCC) 08/24/1965   Solitary lung nodule    RML     Allergies  Allergen Reactions   Breztri Aerosphere [Budeson-Glycopyrrol-Formoterol] Cough   Fluoxetine Other (See Comments)    Vivid dreams   Fluticasone-Umeclidin-Vilant     Other reaction(s): increased cough   Lansoprazole     Other reaction(s): diarrhea   Morphine     REACTION: "feels crazy"   Other     Other reaction(s): vivid dreams   Venlafaxine     Other reaction(s): Other (See Comments)   Budesonide Other (See Comments)    Thrush   Morphine Sulfate Anxiety   Prozac [Fluoxetine Hcl] Anxiety     Outpatient Medications  Prior to Visit  Medication Sig Dispense Refill   albuterol (VENTOLIN HFA) 108 (90 Base) MCG/ACT inhaler Inhale 2 puffs into the lungs every 6 (six) hours as needed for wheezing or shortness of breath. 8 g 0   aspirin EC 81 MG tablet Take 1 tablet (81 mg total) by mouth daily. Swallow whole. 30 tablet 11   Azelastine HCl 137 MCG/SPRAY SOLN Use 1 to 2 sprays in each nostril twice daily. 30 mL 6   chlorpheniramine-HYDROcodone (TUSSIONEX) 10-8 MG/5ML Take 5 mLs by mouth at bedtime as needed for cough. 473 mL 0   ciclopirox (PENLAC) 8 %  solution Apply topically at bedtime. Apply over nail and surrounding skin. Apply daily over previous coat. After seven (7) days, may remove with alcohol and continue cycle. 6.6 mL 3   diphenhydrAMINE (BENADRYL) 25 MG tablet Take 25 mg by mouth at bedtime as needed for allergies.     ipratropium (ATROVENT) 0.06 % nasal spray Place 2 sprays into both nostrils 4 (four) times daily. 15 mL 5   ipratropium-albuterol (DUONEB) 0.5-2.5 (3) MG/3ML SOLN Take 3 mLs by nebulization every 6 (six) hours as needed. TWICE A DAY 1080 mL 2   latanoprost (XALATAN) 0.005 % ophthalmic solution Place 1 drop into both eyes at bedtime.     latanoprost (XALATAN) 0.005 % ophthalmic solution Instill 1 drop into both eyes every night at bedtime as directed. 2.5 mL 3   loperamide (IMODIUM A-D) 2 MG tablet Take 2 mg by mouth 4 (four) times daily as needed for diarrhea or loose stools.     losartan (COZAAR) 25 MG tablet Take 1 tablet (25 mg total) by mouth daily. 90 tablet 1   pantoprazole (PROTONIX) 40 MG tablet Take 1 tablet (40 mg total) by mouth daily. 90 tablet 4   rosuvastatin (CRESTOR) 20 MG tablet Take 1 tablet (20 mg total) by mouth daily. 90 tablet 3   valACYclovir (VALTREX) 1000 MG tablet Take 2 tablets (2,000 mg total) by mouth at onset of symptoms and repeat in 12 hours. 4 tablet 1   azithromycin (ZITHROMAX) 250 MG tablet Take two tablets on day 1, then one tablet daily on day 2-5. 6 each 0   doxycycline (VIBRA-TABS) 100 MG tablet Take 1 tablet (100 mg total) by mouth 2 (two) times daily. 14 tablet 0   rivaroxaban (XARELTO) 10 MG TABS tablet Take 1 tablet (10 mg total) by mouth daily. 14 tablet 0   No facility-administered medications prior to visit.    Review of Systems  Constitutional:  Negative for chills, diaphoresis, fever, malaise/fatigue and weight loss.  HENT:  Positive for congestion.   Respiratory:  Positive for cough and sputum production. Negative for hemoptysis, shortness of breath and wheezing.    Cardiovascular:  Negative for chest pain, palpitations and leg swelling.     Objective:   Vitals:   02/17/23 0902  BP: 128/72  Pulse: 69  SpO2: 97%  Weight: 262 lb (118.8 kg)  Height: 6\' 1"  (1.854 m)    SpO2: 97 % (on RA)  Physical Exam: General: Well-appearing, no acute distress HENT: , AT Eyes: EOMI, no scleral icterus Respiratory: Clear to auscultation bilaterally.  No crackles, wheezing or rales Cardiovascular: RRR, -M/R/G, no JVD Extremities:-Edema,-tenderness Neuro: AAO x4, CNII-XII grossly intact Psych: Normal mood, normal affect   Data Reviewed:  Imaging: CT Chest 07/05/18 - Minimal hazy mosaic attenuation throughout. No pulmonary nodules noted.  PFT: 05/24/20 FVC 2.8 (57%) FEV1 1.9 (52%) Ratio 67  Interpretation: Moderately  severe obstructive defect  Labs: CBC    Component Value Date/Time   WBC 4.0 (L) 01/12/2011 1204   RBC 4.28 01/12/2011 1204   HGB 13.6 01/12/2011 1204   HCT 39.1 01/12/2011 1204   PLT 195.0 01/12/2011 1204   MCV 91.3 01/12/2011 1204   MCH 31.0 11/15/2010 1845   MCHC 34.8 01/12/2011 1204   RDW 14.2 01/12/2011 1204   LYMPHSABS 1.4 01/12/2011 1204   MONOABS 0.3 01/12/2011 1204   EOSABS 0.1 01/12/2011 1204   BASOSABS 0.0 01/12/2011 1204   Abs eos 01/12/11 - 100    Assessment & Plan:   Discussion: 77 year old male former smoker with childhood asthma and moderately severe COPD/asthma who presents for follow-up. Well controlled symptoms. Last exacerbation in 10/2022. Usually one annual exacerbation. Interested in trying maintenance inhaler again, will rx one without steroids.  Prior inhalers: Stiolto - not well-controlled Breztri - intolerance due to thrush even with spacer  Moderate-severe COPD with asthma overlap (FEV 52%) COVID-19 long hauler with dyspnea --START Anoro ONE puff ONCE a day --CONTINUE Albuterol AS NEEDED for shortness of breath or wheezing. --CONTINUE Duonebs AS NEEDED for severe shortness of breath or  wheezing --Encourage regular aerobic exercise  Nasal congestion --START zyrtec (generic: cetirizine) once a day --Recommend atrovent nasal spray twice a day as needed --AVOID Afrin  Health Maintenance Immunization History  Administered Date(s) Administered   Fluad Quad(high Dose 65+) 05/20/2022   Influenza Inj Mdck Quad Pf 06/02/2018   Influenza Split 06/29/2008, 07/04/2009, 06/14/2014, 06/25/2014, 05/23/2015, 04/10/2019, 07/08/2021   Influenza Whole 05/24/2009, 10/27/2010   Influenza, High Dose Seasonal PF 07/17/2014, 07/22/2016, 05/28/2017, 06/24/2017, 04/10/2019, 07/03/2021   Influenza,inj,Quad PF,6+ Mos 06/06/2015   PFIZER(Purple Top)SARS-COV-2 Vaccination 10/05/2019, 10/30/2019, 05/20/2020, 12/06/2020   Pneumococcal Conjugate-13 11/05/2014   Pneumococcal Polysaccharide-23 08/06/2011, 04/14/2014   Respiratory Syncytial Virus Vaccine,Recomb Aduvanted(Arexvy) 05/20/2022   Td 09/01/2005   Unspecified SARS-COV-2 Vaccination 06/04/2022    CT Lung Screen - not qualified  No orders of the defined types were placed in this encounter.  Meds ordered this encounter  Medications   umeclidinium-vilanterol (ANORO ELLIPTA) 62.5-25 MCG/ACT AEPB    Sig: Inhale 1 puff into the lungs daily.    Dispense:  60 each    Refill:  5    Return in about 6 months (around 08/19/2023) for routine follow-up.   I have spent a total time of 30-minutes on the day of the appointment including chart review, data review, collecting history, coordinating care and discussing medical diagnosis and plan with the patient/family. Past medical history, allergies, medications were reviewed. Pertinent imaging, labs and tests included in this note have been reviewed and interpreted independently by me.   Mechele Collin, MD [facility] 02/17/2023 9:37 AM  Office Number [phone]

## 2023-02-18 ENCOUNTER — Other Ambulatory Visit (HOSPITAL_BASED_OUTPATIENT_CLINIC_OR_DEPARTMENT_OTHER): Payer: Self-pay

## 2023-03-15 ENCOUNTER — Other Ambulatory Visit (HOSPITAL_BASED_OUTPATIENT_CLINIC_OR_DEPARTMENT_OTHER): Payer: Self-pay

## 2023-03-16 ENCOUNTER — Other Ambulatory Visit: Payer: Self-pay

## 2023-03-16 ENCOUNTER — Other Ambulatory Visit (HOSPITAL_BASED_OUTPATIENT_CLINIC_OR_DEPARTMENT_OTHER): Payer: Self-pay

## 2023-03-29 ENCOUNTER — Telehealth: Payer: Self-pay | Admitting: Pulmonary Disease

## 2023-03-29 ENCOUNTER — Other Ambulatory Visit (HOSPITAL_BASED_OUTPATIENT_CLINIC_OR_DEPARTMENT_OTHER): Payer: Self-pay

## 2023-03-29 MED ORDER — AZITHROMYCIN 250 MG PO TABS
ORAL_TABLET | ORAL | 0 refills | Status: DC
  Filled 2023-03-29: qty 6, 5d supply, fill #0

## 2023-03-29 MED ORDER — PREDNISONE 10 MG PO TABS
ORAL_TABLET | ORAL | 0 refills | Status: DC
  Filled 2023-03-29: qty 20, 8d supply, fill #0

## 2023-03-29 NOTE — Telephone Encounter (Signed)
Called and spoke with patient. He stated that he believes he has been having a COPD exacerbation for the past 3-4 days. He has noticed an increase his coughing, wheezing and SOB. He has been coughing up clear phlegm. Denied any body aches or fevers.   He confirmed that he has been using his Anoro with 1 puff daily (per patient, he has only been on the medication for about a month now) as well as the albuterol HFA and Duoneb solution as needed.   Denied any recent sick exposures.   I confirmed his pharmacy as the pharmacy at [facility].   Dr. Maple Hudson, can you please advise since Dr. Everardo All is out of the office today? Thanks!

## 2023-03-29 NOTE — Telephone Encounter (Signed)
Called and spoke with patient. He verbalized understanding and appreciation. Meds have been sent to pharmacy.   Nothing further needed at time of call.

## 2023-03-29 NOTE — Telephone Encounter (Signed)
Prednisone 10 mg, # 20, 4 X 2 DAYS, 3 X 2 DAYS, 2 X 2 DAYS, 1 X 2 DAYS  Zpak   250 mg, # 6, 2 today then one daily 

## 2023-03-31 ENCOUNTER — Other Ambulatory Visit (HOSPITAL_BASED_OUTPATIENT_CLINIC_OR_DEPARTMENT_OTHER): Payer: Self-pay

## 2023-04-05 ENCOUNTER — Encounter (HOSPITAL_BASED_OUTPATIENT_CLINIC_OR_DEPARTMENT_OTHER): Payer: Self-pay | Admitting: Pulmonary Disease

## 2023-04-05 NOTE — Telephone Encounter (Signed)
Per 03/29/23 telephone encounter: Jetty Duhamel D, MD 03/29/23 11:50 AM Note Prednisone 10 mg, # 20, 4 X 2 DAYS, 3 X 2 DAYS, 2 X 2 DAYS, 1 X 2 DAYS   Zpak 250 mg, # 6, 2 today then one daily     Patient now also requesting refill on Tussionex. Please advise, thank you!

## 2023-04-06 ENCOUNTER — Ambulatory Visit: Payer: PPO | Admitting: Podiatry

## 2023-04-06 DIAGNOSIS — M79675 Pain in left toe(s): Secondary | ICD-10-CM

## 2023-04-06 DIAGNOSIS — M79674 Pain in right toe(s): Secondary | ICD-10-CM

## 2023-04-06 DIAGNOSIS — B351 Tinea unguium: Secondary | ICD-10-CM | POA: Diagnosis not present

## 2023-04-07 ENCOUNTER — Other Ambulatory Visit (HOSPITAL_BASED_OUTPATIENT_CLINIC_OR_DEPARTMENT_OTHER): Payer: Self-pay

## 2023-04-07 MED ORDER — HYDROCOD POLI-CHLORPHE POLI ER 10-8 MG/5ML PO SUER
5.0000 mL | Freq: Every evening | ORAL | 0 refills | Status: DC | PRN
  Filled 2023-04-07: qty 150, 30d supply, fill #0

## 2023-04-07 MED ORDER — GUAIFENESIN-CODEINE 100-10 MG/5ML PO SOLN
5.0000 mL | Freq: Every evening | ORAL | 0 refills | Status: DC | PRN
  Filled 2023-04-07: qty 240, 48d supply, fill #0

## 2023-04-07 NOTE — Addendum Note (Signed)
Addended by: Luciano Cutter on: 04/07/2023 11:26 AM   Modules accepted: Orders

## 2023-04-11 NOTE — Progress Notes (Signed)
  Subjective:  Patient ID: Carlos Romero, male    DOB: 20-Mar-1946,  MRN: [id number]  Follow-up nail fungus has been using ciclopirox  77 y.o. male presents with the above complaint. History confirmed with patient.  Feels that he is showing improvement.  Objective:  Physical Exam: warm, good capillary refill, no trophic changes or ulcerative lesions, normal DP and PT pulses, and normal sensory exam.  Residual onychomycosis, L3, R2    Assessment:   1. Pain due to onychomycosis of toenails of both feet       Plan:  Patient was evaluated and treated and all questions answered.  Showing improvement.  Sharp and mechanical debridement performed of all painful and mycotic nails today. Nails debrided in length and thickness using a nail nipper to level of comfort.  Continue using Penlac on L3 and R2, should take a break from our 2 for the nail to heal a bit.    Return in about 6 months (around 10/07/2023) for follow up after nail fungus treatment.

## 2023-04-14 ENCOUNTER — Encounter (HOSPITAL_BASED_OUTPATIENT_CLINIC_OR_DEPARTMENT_OTHER): Payer: Self-pay | Admitting: Pulmonary Disease

## 2023-04-15 ENCOUNTER — Other Ambulatory Visit (HOSPITAL_BASED_OUTPATIENT_CLINIC_OR_DEPARTMENT_OTHER): Payer: Self-pay

## 2023-04-15 ENCOUNTER — Other Ambulatory Visit: Payer: Self-pay

## 2023-04-15 ENCOUNTER — Other Ambulatory Visit (HOSPITAL_COMMUNITY): Payer: Self-pay

## 2023-04-16 ENCOUNTER — Other Ambulatory Visit (HOSPITAL_BASED_OUTPATIENT_CLINIC_OR_DEPARTMENT_OTHER): Payer: Self-pay

## 2023-04-16 MED ORDER — BUDESONIDE 0.5 MG/2ML IN SUSP
0.5000 mg | Freq: Every day | RESPIRATORY_TRACT | 2 refills | Status: DC
  Filled 2023-04-16: qty 60, 30d supply, fill #0
  Filled 2023-05-18: qty 60, 30d supply, fill #1
  Filled 2023-06-25: qty 60, 30d supply, fill #2

## 2023-04-16 NOTE — Addendum Note (Signed)
Addended by: Luciano Cutter on: 04/16/2023 07:47 AM   Modules accepted: Orders

## 2023-04-17 ENCOUNTER — Other Ambulatory Visit (HOSPITAL_BASED_OUTPATIENT_CLINIC_OR_DEPARTMENT_OTHER): Payer: Self-pay

## 2023-04-20 ENCOUNTER — Other Ambulatory Visit (HOSPITAL_BASED_OUTPATIENT_CLINIC_OR_DEPARTMENT_OTHER): Payer: Self-pay

## 2023-04-20 ENCOUNTER — Other Ambulatory Visit: Payer: Self-pay

## 2023-04-21 ENCOUNTER — Other Ambulatory Visit (HOSPITAL_BASED_OUTPATIENT_CLINIC_OR_DEPARTMENT_OTHER): Payer: Self-pay

## 2023-05-13 DIAGNOSIS — N529 Male erectile dysfunction, unspecified: Secondary | ICD-10-CM | POA: Diagnosis not present

## 2023-05-13 DIAGNOSIS — C61 Malignant neoplasm of prostate: Secondary | ICD-10-CM | POA: Diagnosis not present

## 2023-05-13 DIAGNOSIS — N35014 Post-traumatic urethral stricture, male, unspecified: Secondary | ICD-10-CM | POA: Diagnosis not present

## 2023-05-18 ENCOUNTER — Other Ambulatory Visit (HOSPITAL_BASED_OUTPATIENT_CLINIC_OR_DEPARTMENT_OTHER): Payer: Self-pay

## 2023-06-02 DIAGNOSIS — H401131 Primary open-angle glaucoma, bilateral, mild stage: Secondary | ICD-10-CM | POA: Diagnosis not present

## 2023-06-14 ENCOUNTER — Other Ambulatory Visit (HOSPITAL_BASED_OUTPATIENT_CLINIC_OR_DEPARTMENT_OTHER): Payer: Self-pay

## 2023-06-14 ENCOUNTER — Other Ambulatory Visit: Payer: Self-pay

## 2023-06-14 MED ORDER — NYSTATIN-TRIAMCINOLONE 100000-0.1 UNIT/GM-% EX CREA
TOPICAL_CREAM | CUTANEOUS | 1 refills | Status: AC
  Filled 2023-06-14: qty 15, 30d supply, fill #0

## 2023-06-14 MED FILL — Pantoprazole Sodium EC Tab 40 MG (Base Equiv): ORAL | 90 days supply | Qty: 90 | Fill #0 | Status: AC

## 2023-06-25 ENCOUNTER — Other Ambulatory Visit: Payer: Self-pay

## 2023-06-25 ENCOUNTER — Other Ambulatory Visit: Payer: Self-pay | Admitting: Pulmonary Disease

## 2023-06-25 MED ORDER — IPRATROPIUM BROMIDE 0.06 % NA SOLN
2.0000 | Freq: Four times a day (QID) | NASAL | 5 refills | Status: DC
  Filled 2023-06-25: qty 15, 19d supply, fill #0
  Filled 2023-07-15: qty 15, 19d supply, fill #1
  Filled 2023-08-14: qty 15, 19d supply, fill #2
  Filled 2023-10-13: qty 15, 19d supply, fill #3
  Filled 2023-12-05: qty 15, 19d supply, fill #4
  Filled 2023-12-29: qty 15, 19d supply, fill #5

## 2023-06-26 ENCOUNTER — Other Ambulatory Visit (HOSPITAL_BASED_OUTPATIENT_CLINIC_OR_DEPARTMENT_OTHER): Payer: Self-pay

## 2023-07-09 ENCOUNTER — Other Ambulatory Visit (HOSPITAL_BASED_OUTPATIENT_CLINIC_OR_DEPARTMENT_OTHER): Payer: Self-pay

## 2023-07-15 ENCOUNTER — Other Ambulatory Visit: Payer: Self-pay | Admitting: Cardiovascular Disease

## 2023-07-15 ENCOUNTER — Other Ambulatory Visit: Payer: Self-pay

## 2023-07-15 DIAGNOSIS — I517 Cardiomegaly: Secondary | ICD-10-CM

## 2023-07-15 DIAGNOSIS — I5189 Other ill-defined heart diseases: Secondary | ICD-10-CM

## 2023-07-16 ENCOUNTER — Other Ambulatory Visit (HOSPITAL_BASED_OUTPATIENT_CLINIC_OR_DEPARTMENT_OTHER): Payer: Self-pay

## 2023-07-16 MED ORDER — LOSARTAN POTASSIUM 25 MG PO TABS
25.0000 mg | ORAL_TABLET | Freq: Every day | ORAL | 0 refills | Status: DC
  Filled 2023-07-16: qty 30, 30d supply, fill #0

## 2023-07-26 DIAGNOSIS — Z8546 Personal history of malignant neoplasm of prostate: Secondary | ICD-10-CM | POA: Diagnosis not present

## 2023-07-26 DIAGNOSIS — I2510 Atherosclerotic heart disease of native coronary artery without angina pectoris: Secondary | ICD-10-CM | POA: Diagnosis not present

## 2023-07-26 DIAGNOSIS — Z1159 Encounter for screening for other viral diseases: Secondary | ICD-10-CM | POA: Diagnosis not present

## 2023-07-26 DIAGNOSIS — E7800 Pure hypercholesterolemia, unspecified: Secondary | ICD-10-CM | POA: Diagnosis not present

## 2023-07-27 ENCOUNTER — Other Ambulatory Visit (HOSPITAL_BASED_OUTPATIENT_CLINIC_OR_DEPARTMENT_OTHER): Payer: Self-pay

## 2023-07-27 DIAGNOSIS — K219 Gastro-esophageal reflux disease without esophagitis: Secondary | ICD-10-CM | POA: Diagnosis not present

## 2023-07-27 DIAGNOSIS — K2270 Barrett's esophagus without dysplasia: Secondary | ICD-10-CM | POA: Diagnosis not present

## 2023-07-27 DIAGNOSIS — I2510 Atherosclerotic heart disease of native coronary artery without angina pectoris: Secondary | ICD-10-CM | POA: Diagnosis not present

## 2023-07-27 DIAGNOSIS — Z0000 Encounter for general adult medical examination without abnormal findings: Secondary | ICD-10-CM | POA: Diagnosis not present

## 2023-07-27 DIAGNOSIS — I77810 Thoracic aortic ectasia: Secondary | ICD-10-CM | POA: Diagnosis not present

## 2023-07-27 DIAGNOSIS — E669 Obesity, unspecified: Secondary | ICD-10-CM | POA: Diagnosis not present

## 2023-07-27 DIAGNOSIS — Z1159 Encounter for screening for other viral diseases: Secondary | ICD-10-CM | POA: Diagnosis not present

## 2023-07-27 DIAGNOSIS — Z8546 Personal history of malignant neoplasm of prostate: Secondary | ICD-10-CM | POA: Diagnosis not present

## 2023-07-27 DIAGNOSIS — E7800 Pure hypercholesterolemia, unspecified: Secondary | ICD-10-CM | POA: Diagnosis not present

## 2023-07-27 DIAGNOSIS — Z23 Encounter for immunization: Secondary | ICD-10-CM | POA: Diagnosis not present

## 2023-07-27 DIAGNOSIS — I10 Essential (primary) hypertension: Secondary | ICD-10-CM | POA: Diagnosis not present

## 2023-07-27 DIAGNOSIS — J449 Chronic obstructive pulmonary disease, unspecified: Secondary | ICD-10-CM | POA: Diagnosis not present

## 2023-07-27 MED ORDER — COMIRNATY 30 MCG/0.3ML IM SUSY
0.3000 mL | PREFILLED_SYRINGE | Freq: Once | INTRAMUSCULAR | 0 refills | Status: AC
  Filled 2023-07-27: qty 0.3, 1d supply, fill #0

## 2023-07-27 MED ORDER — LOSARTAN POTASSIUM 25 MG PO TABS
25.0000 mg | ORAL_TABLET | Freq: Every day | ORAL | 3 refills | Status: AC
  Filled 2023-07-27: qty 90, 90d supply, fill #0
  Filled 2023-10-13: qty 90, 90d supply, fill #1
  Filled 2024-04-12: qty 90, 90d supply, fill #2

## 2023-08-09 ENCOUNTER — Encounter (HOSPITAL_BASED_OUTPATIENT_CLINIC_OR_DEPARTMENT_OTHER): Payer: Self-pay | Admitting: Pulmonary Disease

## 2023-08-10 ENCOUNTER — Other Ambulatory Visit (HOSPITAL_BASED_OUTPATIENT_CLINIC_OR_DEPARTMENT_OTHER): Payer: Self-pay

## 2023-08-10 MED ORDER — HYDROCOD POLI-CHLORPHE POLI ER 10-8 MG/5ML PO SUER
5.0000 mL | Freq: Two times a day (BID) | ORAL | 0 refills | Status: DC | PRN
  Filled 2023-08-10: qty 115, 12d supply, fill #0

## 2023-08-14 ENCOUNTER — Other Ambulatory Visit (HOSPITAL_BASED_OUTPATIENT_CLINIC_OR_DEPARTMENT_OTHER): Payer: Self-pay

## 2023-08-14 ENCOUNTER — Other Ambulatory Visit (HOSPITAL_BASED_OUTPATIENT_CLINIC_OR_DEPARTMENT_OTHER): Payer: Self-pay | Admitting: Pulmonary Disease

## 2023-08-14 MED ORDER — ANORO ELLIPTA 62.5-25 MCG/ACT IN AEPB
1.0000 | INHALATION_SPRAY | Freq: Every day | RESPIRATORY_TRACT | 5 refills | Status: DC
  Filled 2023-08-14: qty 60, 30d supply, fill #0

## 2023-08-16 ENCOUNTER — Other Ambulatory Visit (HOSPITAL_BASED_OUTPATIENT_CLINIC_OR_DEPARTMENT_OTHER): Payer: Self-pay

## 2023-08-27 ENCOUNTER — Ambulatory Visit (HOSPITAL_BASED_OUTPATIENT_CLINIC_OR_DEPARTMENT_OTHER): Payer: PPO | Admitting: Pulmonary Disease

## 2023-08-27 ENCOUNTER — Other Ambulatory Visit (HOSPITAL_BASED_OUTPATIENT_CLINIC_OR_DEPARTMENT_OTHER): Payer: Self-pay

## 2023-08-27 ENCOUNTER — Encounter (HOSPITAL_BASED_OUTPATIENT_CLINIC_OR_DEPARTMENT_OTHER): Payer: Self-pay | Admitting: Pulmonary Disease

## 2023-08-27 VITALS — BP 122/74 | HR 62 | Resp 18 | Ht 73.0 in | Wt 273.3 lb

## 2023-08-27 DIAGNOSIS — Z8616 Personal history of COVID-19: Secondary | ICD-10-CM

## 2023-08-27 DIAGNOSIS — J4489 Other specified chronic obstructive pulmonary disease: Secondary | ICD-10-CM

## 2023-08-27 MED ORDER — BUDESONIDE 0.5 MG/2ML IN SUSP
0.5000 mg | Freq: Every day | RESPIRATORY_TRACT | 5 refills | Status: DC
  Filled 2023-08-27: qty 60, 30d supply, fill #0
  Filled 2023-11-15: qty 60, 30d supply, fill #1
  Filled 2023-12-29: qty 60, 30d supply, fill #2
  Filled 2024-03-13: qty 60, 30d supply, fill #3
  Filled 2024-05-20: qty 60, 30d supply, fill #4
  Filled 2024-07-28: qty 60, 30d supply, fill #5

## 2023-08-27 MED ORDER — ANORO ELLIPTA 62.5-25 MCG/ACT IN AEPB
1.0000 | INHALATION_SPRAY | Freq: Every day | RESPIRATORY_TRACT | 11 refills | Status: DC
  Filled 2023-08-27: qty 60, 60d supply, fill #0
  Filled 2023-09-13: qty 60, 30d supply, fill #0
  Filled 2023-10-13: qty 60, 30d supply, fill #1
  Filled 2023-11-15: qty 60, 30d supply, fill #2
  Filled 2023-12-18: qty 60, 30d supply, fill #3
  Filled 2024-01-18: qty 60, 30d supply, fill #4
  Filled 2024-02-22: qty 60, 30d supply, fill #5
  Filled 2024-03-20: qty 60, 30d supply, fill #6
  Filled 2024-04-22: qty 60, 30d supply, fill #7
  Filled 2024-05-29: qty 60, 30d supply, fill #8

## 2023-08-27 NOTE — Progress Notes (Signed)
 Subjective:   PATIENT ID: Carlos Marcey Ahle GENDER: male DOB: 12/05/1945, MRN: [id number]    Chief Complaint  Patient presents with   Follow-up   Reason for Visit: Follow-up  Carlos Romero is a 78 year old male former smoker with COPD, COVID-19 in 05/18/21, hx childhood asthma, prostate cancer s/p prostatectomy in 1999 and radiation in 2002 and solitary kidney who presents for follow-up.  Synopsis: Since his diagnosis of COVID May 18, 2021, he reports that he has decreased stamina and shortness of breath with activity. He previously went the the gym 4-5 times a week, using treadmill and machines. Walking upstairs is difficult. Uses albuterol  once a week. Minimal cough usually during the night. Uses cough syrup twice a week. Did not tolerate Breztri . On Duonebs TID for COPD maintenance. 2022 - COVID in 05-18-24. Treated for COPD exacerbation in Dec 2023 - Ankle surgery>back to exercising 3x/week. Not on maintenance inhalers. PRN Duonbs and albuterol . Treated for COPD exacerbation in Dec 2024 - COPD exacerbation in March and October. Frequent sinus issues. On Anoro, daily Pulmicort  and PRN albuterol . His brother passed away in 05/18/24 and I assisted in his care.  08/27/23 Since our last visit COPD exacerbation telephone call 03/30/23. Budesonide  daily was added to his regimen of Anoro, compliant with this regimen and feels it is doing well. Still going to the gym 3-4 times a week with 1 hour of cardio. Has nasal congestion.  Social History: Quit smoking 30 years ago.  Worked in engineering geologist and hotel  Past Medical History:  Diagnosis Date   Asthma    Barrett's esophagus    Bimalleolar fracture of right ankle    Chronic cough    onset Oct 2010, better after prednisone  short course 12-2009, sinus CT 11-06-10 bilateral maxillary, ethmoid and possibly frontal sinusitis>21 days augmentin    COPD (chronic obstructive pulmonary disease) (HCC)    gold stage 2, PFTs 06-12-09 FEV1 2.25 (65%) ratio 62 and no  better with B2   Depression 08/24/1997   GERD (gastroesophageal reflux disease)    Hyperlipidemia    Hypertension    Lung disease    LLL air space disease   Prostate cancer (HCC) 08/24/2001   PUD (peptic ulcer disease) 08/24/1965   s/p perf   Pulmonary embolism (HCC) 08/24/1965   Solitary lung nodule    RML     Allergies  Allergen Reactions   Breztri  Aerosphere [Budeson-Glycopyrrol-Formoterol ] Cough   Fluoxetine Other (See Comments)    Vivid dreams   Fluticasone -Umeclidin-Vilant     Other reaction(s): increased cough   Lansoprazole     Other reaction(s): diarrhea   Morphine     REACTION: feels crazy   Other     Other reaction(s): vivid dreams   Venlafaxine     Other reaction(s): Other (See Comments)   Budesonide  Other (See Comments)    Thrush   Morphine Sulfate Anxiety   Prozac [Fluoxetine Hcl] Anxiety     Outpatient Medications Prior to Visit  Medication Sig Dispense Refill   albuterol  (VENTOLIN  HFA) 108 (90 Base) MCG/ACT inhaler Inhale 2 puffs into the lungs every 6 (six) hours as needed for wheezing or shortness of breath. 8 Romero 0   aspirin  EC 81 MG tablet Take 1 tablet (81 mg total) by mouth daily. Swallow whole. 30 tablet 11   chlorpheniramine-HYDROcodone (TUSSIONEX) 10-8 MG/5ML Take 5 mLs by mouth every 12 (twelve) hours as needed for cough. 115 mL 0   ciclopirox  (PENLAC ) 8 % solution Apply topically at bedtime.  Apply over nail and surrounding skin. Apply daily over previous coat. After seven (7) days, may remove with alcohol and continue cycle. 6.6 mL 3   diphenhydrAMINE (BENADRYL) 25 MG tablet Take 25 mg by mouth at bedtime as needed for allergies.     ipratropium (ATROVENT ) 0.06 % nasal spray Place 2 sprays into both nostrils 4 (four) times daily. 15 mL 5   ipratropium-albuterol  (DUONEB) 0.5-2.5 (3) MG/3ML SOLN Take 3 mLs by nebulization every 6 (six) hours as needed. TWICE A DAY 1080 mL 2   latanoprost  (XALATAN ) 0.005 % ophthalmic solution Place 1 drop into both  eyes at bedtime.     latanoprost  (XALATAN ) 0.005 % ophthalmic solution Instill 1 drop into both eyes every night at bedtime as directed. 2.5 mL 3   loperamide (IMODIUM A-D) 2 MG tablet Take 2 mg by mouth 4 (four) times daily as needed for diarrhea or loose stools.     losartan  (COZAAR ) 25 MG tablet Take 1 tablet (25 mg total) by mouth daily. 90 tablet 3   nystatin -triamcinolone  (MYCOLOG II) cream Apply to affected area topically 2 (two) times a day. 15 Romero 1   pantoprazole  (PROTONIX ) 40 MG tablet Take 1 tablet (40 mg total) by mouth daily. 90 tablet 4   rosuvastatin  (CRESTOR ) 20 MG tablet Take 1 tablet (20 mg total) by mouth daily. 90 tablet 3   umeclidinium-vilanterol (ANORO ELLIPTA ) 62.5-25 MCG/ACT AEPB Inhale 1 puff into the lungs daily. 60 each 5   valACYclovir  (VALTREX ) 1000 MG tablet Take 2 tablets (2,000 mg total) by mouth at onset of symptoms and repeat in 12 hours. 4 tablet 1   budesonide  (PULMICORT ) 0.5 MG/2ML nebulizer solution Take 2 mLs (0.5 mg total) by nebulization daily. 60 mL 2   losartan  (COZAAR ) 25 MG tablet Take 1 tablet (25 mg total) by mouth daily. Please call [phone] to schedule an appointment for future refills. Thank you. 30 tablet 0   No facility-administered medications prior to visit.    Review of Systems  Constitutional:  Negative for chills, diaphoresis, fever, malaise/fatigue and weight loss.  HENT:  Negative for congestion.   Respiratory:  Negative for cough, hemoptysis, sputum production, shortness of breath and wheezing.   Cardiovascular:  Negative for chest pain, palpitations and leg swelling.     Objective:   Vitals:   08/27/23 0941  BP: 122/74  Pulse: 62  Resp: 18  SpO2: 94%  Weight: 273 lb 4.8 oz (124 kg)  Height: 6' 1 (1.854 m)    SpO2: 94 %  Physical Exam: General: Well-appearing, no acute distress HENT: [place], AT Eyes: EOMI, no scleral icterus Respiratory: Clear to auscultation bilaterally.  No crackles, wheezing or  rales Cardiovascular: RRR, -M/R/Romero, no JVD Extremities:-Edema,-tenderness Neuro: AAO x4, CNII-XII grossly intact Psych: Normal mood, normal affect    Data Reviewed:  Imaging: CT Chest 07/05/18 - Minimal hazy mosaic attenuation throughout. No pulmonary nodules noted.  PFT: 05/24/20 FVC 2.8 (57%) FEV1 1.9 (52%) Ratio 67  Interpretation: Moderately severe obstructive defect  Labs: CBC    Component Value Date/Time   WBC 4.0 (L) 01/12/2011 1204   RBC 4.28 01/12/2011 1204   HGB 13.6 01/12/2011 1204   HCT 39.1 01/12/2011 1204   PLT 195.0 01/12/2011 1204   MCV 91.3 01/12/2011 1204   MCH 31.0 11/15/2010 1845   MCHC 34.8 01/12/2011 1204   RDW 14.2 01/12/2011 1204   LYMPHSABS 1.4 01/12/2011 1204   MONOABS 0.3 01/12/2011 1204   EOSABS 0.1 01/12/2011 1204  BASOSABS 0.0 01/12/2011 1204   Abs eos 01/12/11 - 100    Assessment & Plan:   Discussion: 78 year old male former smoker with childhood asthma and moderately severe COPD/asthma who presents for follow-up. Well controlled on current regimen with limitations in activity. Two exacerbations in 2024 with last exacerbation in August 2024 (five months ago).  Prior inhalers: Stiolto - not well-controlled Breztri  - intolerance due to thrush even with spacer  Moderate-severe COPD with asthma overlap (FEV 52%) COVID-19 long hauler with dyspnea --CONTINUE Anoro ONE puff ONCE a day --CONTINUE budesonide  nebulize daily --CONTINUE Albuterol  AS NEEDED for shortness of breath or wheezing. --CONTINUE Duonebs AS NEEDED for severe shortness of breath or wheezing --Encourage regular aerobic exercise  Nasal congestion --CONTINUET zyrtec (generic: cetirizine) once a day --Recommend atrovent  nasal spray twice a day as needed --AVOID Afrin  Health Maintenance Immunization History  Administered Date(s) Administered   Fluad Quad(high Dose 65+) 05/20/2022   Influenza Inj Mdck Quad Pf 06/02/2018   Influenza Split 06/29/2008, 07/04/2009,  06/14/2014, 06/25/2014, 05/23/2015, 04/10/2019, 07/08/2021   Influenza Whole 05/24/2009, 10/27/2010   Influenza, High Dose Seasonal PF 07/17/2014, 07/22/2016, 05/28/2017, 06/24/2017, 04/10/2019, 07/03/2021   Influenza,inj,Quad PF,6+ Mos 06/06/2015   PFIZER(Purple Top)SARS-COV-2 Vaccination 10/05/2019, 10/30/2019, 05/20/2020, 12/06/2020   Pfizer(Comirnaty )Fall Seasonal Vaccine 12 years and older 07/27/2023   Pneumococcal Conjugate-13 11/05/2014   Pneumococcal Polysaccharide-23 08/06/2011, 04/14/2014   Respiratory Syncytial Virus Vaccine ,Recomb Aduvanted(Arexvy ) 05/20/2022   Td 09/01/2005   Unspecified SARS-COV-2 Vaccination 06/04/2022    CT Lung Screen - not qualified  No orders of the defined types were placed in this encounter.  Meds ordered this encounter  Medications   umeclidinium-vilanterol (ANORO ELLIPTA ) 62.5-25 MCG/ACT AEPB    Sig: Inhale 1 puff into the lungs daily.    Dispense:  60 each    Refill:  11   budesonide  (PULMICORT ) 0.5 MG/2ML nebulizer solution    Sig: Take 2 mLs (0.5 mg total) by nebulization daily.    Dispense:  60 mL    Refill:  5    Return in about 8 months (around 04/26/2024).   I have spent a total time of 30-minutes on the day of the appointment including chart review, data review, collecting history, coordinating care and discussing medical diagnosis and plan with the patient/family. Past medical history, allergies, medications were reviewed. Pertinent imaging, labs and tests included in this note have been reviewed and interpreted independently by me.  Braylan Faul Slater Staff, MD [facility] 08/27/2023 10:29 AM  Office Number [phone]

## 2023-08-27 NOTE — Patient Instructions (Signed)
  Moderate-severe COPD with asthma overlap (FEV 52%) COVID-19 long hauler with dyspnea --CONTINUE Anoro ONE puff ONCE a day --CONTINUE budesonide  nebulize daily --CONTINUE Albuterol  AS NEEDED for shortness of breath or wheezing. --CONTINUE Duonebs AS NEEDED for severe shortness of breath or wheezing --Encourage regular aerobic exercise

## 2023-08-31 ENCOUNTER — Ambulatory Visit: Payer: PPO | Admitting: Podiatry

## 2023-08-31 ENCOUNTER — Encounter: Payer: Self-pay | Admitting: Podiatry

## 2023-08-31 VITALS — Ht 73.0 in | Wt 273.3 lb

## 2023-08-31 DIAGNOSIS — B351 Tinea unguium: Secondary | ICD-10-CM

## 2023-08-31 DIAGNOSIS — M79675 Pain in left toe(s): Secondary | ICD-10-CM | POA: Diagnosis not present

## 2023-08-31 DIAGNOSIS — M79674 Pain in right toe(s): Secondary | ICD-10-CM

## 2023-08-31 NOTE — Progress Notes (Signed)
  Subjective:  Patient ID: Carlos Romero, male    DOB: 28-May-1946,  MRN: [id number]  Follow-up nail fungus has been using ciclopirox   78 y.o. male presents with the above complaint. History confirmed with patient.  Feels like they are still improving  Objective:  Physical Exam: warm, good capillary refill, no trophic changes or ulcerative lesions, normal DP and PT pulses, and normal sensory exam.  Residual onychomycosis, L3, R2    Assessment:   1. Pain due to onychomycosis of toenails of both feet       Plan:  Patient was evaluated and treated and all questions answered.  Showing improvement.  Sharp and mechanical debridement performed of all painful and mycotic nails today. Nails debrided in length and thickness using a nail nipper to level of comfort.  Improved significantly should be to stop the Penlac  for now and return in a few months to reevaluate after good nail growth.    No follow-ups on file.

## 2023-09-03 ENCOUNTER — Other Ambulatory Visit (HOSPITAL_BASED_OUTPATIENT_CLINIC_OR_DEPARTMENT_OTHER): Payer: Self-pay

## 2023-09-07 ENCOUNTER — Other Ambulatory Visit (HOSPITAL_BASED_OUTPATIENT_CLINIC_OR_DEPARTMENT_OTHER): Payer: Self-pay

## 2023-09-07 ENCOUNTER — Telehealth: Payer: Self-pay

## 2023-09-07 NOTE — Telephone Encounter (Signed)
*  Pulm  Pharmacy Patient Advocate Encounter   Received notification from CoverMyMeds that prior authorization for Budesonide  0.5MG /2ML suspension  is required/requested.   Insurance verification completed.   The patient is insured through [facility] ADVANTAGE/RX ADVANCE .   Per test claim: PA required; PA submitted to above mentioned insurance via CoverMyMeds Key/confirmation #/EOC [id number] Status is pending

## 2023-09-08 ENCOUNTER — Other Ambulatory Visit (HOSPITAL_BASED_OUTPATIENT_CLINIC_OR_DEPARTMENT_OTHER): Payer: Self-pay

## 2023-09-08 NOTE — Telephone Encounter (Signed)
 Pharmacy Patient Advocate Encounter  Received notification from [facility] ADVANTAGE/RX ADVANCE that Prior Authorization for Budesonide  0.5mg /44ml has been APPROVED from 09/07/2023 to 08/23/2098    PA #/Case ID/Reference #: Approved through patients Medicare Part B

## 2023-09-13 ENCOUNTER — Other Ambulatory Visit: Payer: Self-pay

## 2023-09-13 ENCOUNTER — Other Ambulatory Visit (HOSPITAL_BASED_OUTPATIENT_CLINIC_OR_DEPARTMENT_OTHER): Payer: Self-pay

## 2023-09-14 ENCOUNTER — Other Ambulatory Visit (HOSPITAL_BASED_OUTPATIENT_CLINIC_OR_DEPARTMENT_OTHER): Payer: Self-pay

## 2023-09-14 ENCOUNTER — Other Ambulatory Visit (HOSPITAL_BASED_OUTPATIENT_CLINIC_OR_DEPARTMENT_OTHER): Payer: Self-pay | Admitting: Pulmonary Disease

## 2023-09-14 MED ORDER — GUAIFENESIN-CODEINE 100-10 MG/5ML PO SOLN
5.0000 mL | Freq: Every evening | ORAL | 0 refills | Status: DC | PRN
  Filled 2023-09-14: qty 118, 23d supply, fill #0

## 2023-09-15 ENCOUNTER — Telehealth (HOSPITAL_BASED_OUTPATIENT_CLINIC_OR_DEPARTMENT_OTHER): Payer: PPO | Admitting: Pulmonary Disease

## 2023-09-15 ENCOUNTER — Encounter (HOSPITAL_BASED_OUTPATIENT_CLINIC_OR_DEPARTMENT_OTHER): Payer: Self-pay | Admitting: Pulmonary Disease

## 2023-09-15 ENCOUNTER — Telehealth: Payer: Self-pay | Admitting: Pulmonary Disease

## 2023-09-15 ENCOUNTER — Other Ambulatory Visit (HOSPITAL_BASED_OUTPATIENT_CLINIC_OR_DEPARTMENT_OTHER): Payer: Self-pay

## 2023-09-15 DIAGNOSIS — J441 Chronic obstructive pulmonary disease with (acute) exacerbation: Secondary | ICD-10-CM

## 2023-09-15 MED ORDER — DOXYCYCLINE HYCLATE 100 MG PO TABS
100.0000 mg | ORAL_TABLET | Freq: Two times a day (BID) | ORAL | 0 refills | Status: DC
  Filled 2023-09-15: qty 14, 7d supply, fill #0

## 2023-09-15 MED ORDER — PREDNISONE 10 MG PO TABS
ORAL_TABLET | ORAL | 0 refills | Status: AC
  Filled 2023-09-15: qty 20, 8d supply, fill #0

## 2023-09-15 NOTE — Telephone Encounter (Signed)
Spoke with the pt  He is c/o increased cough over the past 6 days  He is coughing up large amounts of clear sputum  He is using his anoro, codeine cough syrup and nebs without relief  He is wheezing and having increased SOB  Denies any fevers , aches  He declined acute visit that I offered him for tomorrow  Please advise, thanks!  Allergies  Allergen Reactions   Breztri Aerosphere [Budeson-Glycopyrrol-Formoterol] Cough   Fluoxetine Other (See Comments)    Vivid dreams   Fluticasone-Umeclidin-Vilant     Other reaction(s): increased cough   Lansoprazole     Other reaction(s): diarrhea   Morphine     REACTION: "feels crazy"   Other     Other reaction(s): vivid dreams   Venlafaxine     Other reaction(s): Other (See Comments)   Budesonide Other (See Comments)    Thrush   Morphine Sulfate Anxiety   Prozac [Fluoxetine Hcl] Anxiety

## 2023-09-15 NOTE — Telephone Encounter (Signed)
Patient has been added

## 2023-09-15 NOTE — Telephone Encounter (Signed)
Bad cough. PT would like some Pred/Antibx called in. His # is [phone]  [facility]

## 2023-09-15 NOTE — Progress Notes (Addendum)
Virtual Visit via Video Note  I connected with Carlos Romero on 09/15/23 at 12:00 PM EST by a video enabled telemedicine application and verified that I am speaking with the correct person using two identifiers.  Location: Patient: Home Provider: [facility]   I discussed the limitations of evaluation and management by telemedicine and the availability of in person appointments. The patient expressed understanding and agreed to proceed.    I discussed the assessment and treatment plan with the patient. The patient was provided an opportunity to ask questions and all were answered. The patient agreed with the plan and demonstrated an understanding of the instructions.   The patient was advised to call back or seek an in-person evaluation if the symptoms worsen or if the condition fails to improve as anticipated.   Subjective:   PATIENT ID: Carlos Romero GENDER: male DOB: 1945/09/16, MRN: [id number]    Chief Complaint  Patient presents with   Acute Visit    He is c/o increased cough over the past 6 days  He is coughing up large amounts of clear sputum  He is using his anoro, codeine cough syrup and nebs without relief  He is wheezing and having increased SOB  Denies any fevers , aches     Reason for Visit: Follow-up  Carlos Romero is a 78 year old male former smoker with COPD, COVID-19 in 30-May-2021, hx childhood asthma, prostate cancer s/p prostatectomy in 1999 and radiation in 2002 and solitary kidney who presents for follow-up.  Synopsis: Since his diagnosis of COVID 05-30-21, he reports that he has decreased stamina and shortness of breath with activity. He previously went the the gym 4-5 times a week, using treadmill and machines. Walking upstairs is difficult. Uses albuterol once a week. Minimal cough usually during the night. Uses cough syrup twice a week. Did not tolerate Breztri. On Duonebs TID for COPD maintenance. 2022 - COVID in 05/30/24. Treated  for COPD exacerbation in Dec 2023 - Ankle surgery>back to exercising 3x/week. Not on maintenance inhalers. PRN Duonbs and albuterol. Treated for COPD exacerbation in Dec 2024 - COPD exacerbation in March and October. Frequent sinus issues. On Anoro, daily Pulmicort and PRN albuterol. His brother passed away in 05-30-2024 and I assisted in his care.  08/27/23 Since our last visit COPD exacerbation telephone call 03/30/23. Budesonide daily was added to his regimen of Anoro, compliant with this regimen and feels it is doing well. Still going to the gym 3-4 times a week with 1 hour of cardio. Has nasal congestion.  09/15/23 He presents for acute visit for video. He reports worsening productive cough in the last five days. Associated with wheezing and shortness of breath. Mild nasal congestion. Denies fevers or chills or sinus issues. Cough syrup is not helping like it usually does, currently using once a day.  Social History: Quit smoking 30 years ago.  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  Past Medical History:  Diagnosis Date   Asthma    Barrett's esophagus    Bimalleolar fracture of right ankle    Chronic cough    onset Oct 2010, better after prednisone short course 12-2009, sinus CT 11-06-10 bilateral maxillary, ethmoid and possibly frontal sinusitis>21 days augmentin   COPD (chronic obstructive pulmonary disease) (HCC)    gold stage 2, PFTs 06-12-09 FEV1 2.25 (65%) ratio 62 and no better with B2   Depression 08/24/1997   GERD (gastroesophageal reflux disease)    Hyperlipidemia    Hypertension  Lung disease    LLL air space disease   Prostate cancer (HCC) 08/24/2001   PUD (peptic ulcer disease) 08/24/1965   s/p perf   Pulmonary embolism (HCC) 08/24/1965   Solitary lung nodule    RML     Allergies  Allergen Reactions   Breztri Aerosphere [Budeson-Glycopyrrol-Formoterol] Cough   Fluoxetine Other (See Comments)    Vivid dreams   Fluticasone-Umeclidin-Vilant     Other reaction(s): increased  cough   Lansoprazole     Other reaction(s): diarrhea   Morphine     REACTION: "feels crazy"   Other     Other reaction(s): vivid dreams   Venlafaxine     Other reaction(s): Other (See Comments)   Budesonide Other (See Comments)    Thrush   Morphine Sulfate Anxiety   Prozac [Fluoxetine Hcl] Anxiety     Outpatient Medications Prior to Visit  Medication Sig Dispense Refill   albuterol (VENTOLIN HFA) 108 (90 Base) MCG/ACT inhaler Inhale 2 puffs into the lungs every 6 (six) hours as needed for wheezing or shortness of breath. 8 g 0   aspirin EC 81 MG tablet Take 1 tablet (81 mg total) by mouth daily. Swallow whole. 30 tablet 11   budesonide (PULMICORT) 0.5 MG/2ML nebulizer solution Take 2 mLs (0.5 mg total) by nebulization daily. 60 mL 5   ciclopirox (PENLAC) 8 % solution Apply topically at bedtime. Apply over nail and surrounding skin. Apply daily over previous coat. After seven (7) days, may remove with alcohol and continue cycle. 6.6 mL 3   diphenhydrAMINE (BENADRYL) 25 MG tablet Take 25 mg by mouth at bedtime as needed for allergies.     guaiFENesin-codeine 100-10 MG/5ML syrup Take 5 mLs by mouth at bedtime as needed for cough. 118 mL 0   ipratropium (ATROVENT) 0.06 % nasal spray Place 2 sprays into both nostrils 4 (four) times daily. 15 mL 5   ipratropium-albuterol (DUONEB) 0.5-2.5 (3) MG/3ML SOLN Take 3 mLs by nebulization every 6 (six) hours as needed. TWICE A DAY 1080 mL 2   latanoprost (XALATAN) 0.005 % ophthalmic solution Place 1 drop into both eyes at bedtime.     latanoprost (XALATAN) 0.005 % ophthalmic solution Instill 1 drop into both eyes every night at bedtime as directed. 2.5 mL 3   loperamide (IMODIUM A-D) 2 MG tablet Take 2 mg by mouth 4 (four) times daily as needed for diarrhea or loose stools.     losartan (COZAAR) 25 MG tablet Take 1 tablet (25 mg total) by mouth daily. 90 tablet 3   nystatin-triamcinolone (MYCOLOG II) cream Apply to affected area topically 2 (two) times  a day. 15 g 1   pantoprazole (PROTONIX) 40 MG tablet Take 1 tablet (40 mg total) by mouth daily. 90 tablet 4   rosuvastatin (CRESTOR) 20 MG tablet Take 1 tablet (20 mg total) by mouth daily. 90 tablet 3   umeclidinium-vilanterol (ANORO ELLIPTA) 62.5-25 MCG/ACT AEPB Inhale 1 puff into the lungs daily. 60 each 11   valACYclovir (VALTREX) 1000 MG tablet Take 2 tablets (2,000 mg total) by mouth at onset of symptoms and repeat in 12 hours. 4 tablet 1   No facility-administered medications prior to visit.    Review of Systems  Constitutional:  Negative for chills, diaphoresis, fever, malaise/fatigue and weight loss.  HENT:  Positive for congestion.   Respiratory:  Positive for cough, sputum production, shortness of breath and wheezing. Negative for hemoptysis.   Cardiovascular:  Negative for chest pain, palpitations and leg swelling.  Objective:   There were no vitals filed for this visit.     Physical Exam: General: Well-appearing, no acute distress HENT: [place], AT Eyes: EOMI, no scleral icterus Respiratory: No respiratory distress Neuro: AAO x4, CNII-XII grossly intact Psych: Normal mood, normal affect   Data Reviewed:  Imaging: CT Chest 07/05/18 - Minimal hazy mosaic attenuation throughout. No pulmonary nodules noted.  PFT: 05/24/20 FVC 2.8 (57%) FEV1 1.9 (52%) Ratio 67  Interpretation: Moderately severe obstructive defect  Labs: CBC    Component Value Date/Time   WBC 4.0 (L) 01/12/2011 1204   RBC 4.28 01/12/2011 1204   HGB 13.6 01/12/2011 1204   HCT 39.1 01/12/2011 1204   PLT 195.0 01/12/2011 1204   MCV 91.3 01/12/2011 1204   MCH 31.0 11/15/2010 1845   MCHC 34.8 01/12/2011 1204   RDW 14.2 01/12/2011 1204   LYMPHSABS 1.4 01/12/2011 1204   MONOABS 0.3 01/12/2011 1204   EOSABS 0.1 01/12/2011 1204   BASOSABS 0.0 01/12/2011 1204   Abs eos 01/12/11 - 100    Assessment & Plan:   Discussion: 78 year old male former smoker with childhood asthma and moderately  severe COPD/asthma who presents for follow-up. Currently in exacerbation 08/2023. Last exacerbation 2024 x 2.  Prior inhalers: Stiolto - not well-controlled Breztri - intolerance due to thrush even with spacer  Moderate-severe COPD with asthma overlap (FEV 52%), currently in exacerbation COVID-19 long hauler with dyspnea --Prednisone taper ordered --Doxycycline ordered --If symptoms do not improve then will need CXR --Encourage rest, hydration, warm beverages including tea with honey --CONTINUE Anoro ONE puff ONCE a day --CONTINUE budesonide nebulize daily. Increase to twice for two days --CONTINUE Albuterol AS NEEDED for shortness of breath or wheezing. --CONTINUE Duonebs AS NEEDED for severe shortness of breath or wheezing --Encourage regular aerobic exercise --Continue cough syrup. Can use OTC like robitussin  Nasal congestion --CONTINUET zyrtec (generic: cetirizine) once a day --Recommend atrovent nasal spray twice a day as needed --AVOID Afrin  Health Maintenance Immunization History  Administered Date(s) Administered   Fluad Quad(high Dose 65+) 05/20/2022   Influenza Inj Mdck Quad Pf 06/02/2018   Influenza Split 06/29/2008, 07/04/2009, 06/14/2014, 06/25/2014, 05/23/2015, 04/10/2019, 07/08/2021   Influenza Whole 05/24/2009, 10/27/2010   Influenza, High Dose Seasonal PF 07/17/2014, 07/22/2016, 05/28/2017, 06/24/2017, 04/10/2019, 07/03/2021   Influenza,inj,Quad PF,6+ Mos 06/06/2015   PFIZER(Purple Top)SARS-COV-2 Vaccination 10/05/2019, 10/30/2019, 05/20/2020, 12/06/2020   Pfizer(Comirnaty)Fall Seasonal Vaccine 12 years and older 07/27/2023   Pneumococcal Conjugate-13 11/05/2014   Pneumococcal Polysaccharide-23 08/06/2011, 04/14/2014   Respiratory Syncytial Virus Vaccine,Recomb Aduvanted(Arexvy) 05/20/2022   Td 09/01/2005   Unspecified SARS-COV-2 Vaccination 06/04/2022    CT Lung Screen - not qualified  No orders of the defined types were placed in this encounter.  Meds  ordered this encounter  Medications   doxycycline (VIBRA-TABS) 100 MG tablet    Sig: Take 1 tablet (100 mg total) by mouth 2 (two) times daily.    Dispense:  14 tablet    Refill:  0   predniSONE (DELTASONE) 10 MG tablet    Sig: Take 4 tablets (40 mg total) by mouth daily with breakfast for 2 days, THEN 3 tablets (30 mg total) daily with breakfast for 2 days, THEN 2 tablets (20 mg total) daily with breakfast for 2 days, THEN 1 tablet (10 mg total) daily with breakfast for 2 days.    Dispense:  20 tablet    Refill:  0    No follow-ups on file. Recall in place  I have spent a  total time of 30-minutes on the day of the appointment including chart review, data review, collecting history, coordinating care and discussing medical diagnosis and plan with the patient/family. Past medical history, allergies, medications were reviewed. Pertinent imaging, labs and tests included in this note have been reviewed and interpreted independently by me.  Annibelle Brazie Mechele Collin, MD [facility] 09/15/2023 12:09 PM

## 2023-09-15 NOTE — Telephone Encounter (Signed)
NFN 

## 2023-09-15 NOTE — Patient Instructions (Signed)
Moderate-severe COPD with asthma overlap (FEV 52%), currently in exacerbation COVID-19 long hauler with dyspnea --Prednisone taper ordered --Doxycycline ordered --If symptoms do not improve then will need CXR --Encourage rest, hydration, warm beverages including tea with honey --CONTINUE Anoro ONE puff ONCE a day --CONTINUE budesonide nebulize daily. Increase to twice for two days --CONTINUE Albuterol AS NEEDED for shortness of breath or wheezing. --CONTINUE Duonebs AS NEEDED for severe shortness of breath or wheezing --Encourage regular aerobic exercise --Continue cough syrup. Can use OTC like robitussin

## 2023-10-13 ENCOUNTER — Other Ambulatory Visit: Payer: Self-pay | Admitting: Cardiovascular Disease

## 2023-10-13 ENCOUNTER — Other Ambulatory Visit (HOSPITAL_BASED_OUTPATIENT_CLINIC_OR_DEPARTMENT_OTHER): Payer: Self-pay

## 2023-10-13 MED ORDER — ROSUVASTATIN CALCIUM 20 MG PO TABS
20.0000 mg | ORAL_TABLET | Freq: Every day | ORAL | 0 refills | Status: DC
  Filled 2023-10-13: qty 30, 30d supply, fill #0

## 2023-11-15 ENCOUNTER — Other Ambulatory Visit: Payer: Self-pay | Admitting: Cardiovascular Disease

## 2023-11-16 ENCOUNTER — Other Ambulatory Visit: Payer: Self-pay

## 2023-11-17 ENCOUNTER — Other Ambulatory Visit: Payer: Self-pay | Admitting: *Deleted

## 2023-11-17 ENCOUNTER — Other Ambulatory Visit (HOSPITAL_BASED_OUTPATIENT_CLINIC_OR_DEPARTMENT_OTHER): Payer: Self-pay

## 2023-11-17 MED ORDER — ROSUVASTATIN CALCIUM 20 MG PO TABS
20.0000 mg | ORAL_TABLET | Freq: Every day | ORAL | 0 refills | Status: DC
  Filled 2023-11-17: qty 30, 30d supply, fill #0

## 2023-11-23 ENCOUNTER — Other Ambulatory Visit (HOSPITAL_BASED_OUTPATIENT_CLINIC_OR_DEPARTMENT_OTHER): Payer: Self-pay

## 2023-12-01 ENCOUNTER — Other Ambulatory Visit (HOSPITAL_BASED_OUTPATIENT_CLINIC_OR_DEPARTMENT_OTHER): Payer: Self-pay

## 2023-12-01 ENCOUNTER — Other Ambulatory Visit: Payer: Self-pay | Admitting: Internal Medicine

## 2023-12-01 MED ORDER — PANTOPRAZOLE SODIUM 40 MG PO TBEC
40.0000 mg | DELAYED_RELEASE_TABLET | Freq: Every day | ORAL | 4 refills | Status: AC
  Filled 2023-12-01: qty 90, 90d supply, fill #0
  Filled 2023-12-20 – 2024-03-13 (×2): qty 90, 90d supply, fill #1
  Filled 2024-04-22 – 2024-06-07 (×2): qty 90, 90d supply, fill #2
  Filled 2024-09-04: qty 90, 90d supply, fill #3

## 2023-12-01 NOTE — Telephone Encounter (Signed)
 Please advise was told to defer to primary care for further refills

## 2023-12-04 ENCOUNTER — Encounter (HOSPITAL_BASED_OUTPATIENT_CLINIC_OR_DEPARTMENT_OTHER): Payer: Self-pay | Admitting: Pulmonary Disease

## 2023-12-05 ENCOUNTER — Other Ambulatory Visit: Payer: Self-pay | Admitting: Cardiovascular Disease

## 2023-12-06 ENCOUNTER — Other Ambulatory Visit: Payer: Self-pay

## 2023-12-06 ENCOUNTER — Other Ambulatory Visit (HOSPITAL_BASED_OUTPATIENT_CLINIC_OR_DEPARTMENT_OTHER): Payer: Self-pay

## 2023-12-07 ENCOUNTER — Other Ambulatory Visit (HOSPITAL_BASED_OUTPATIENT_CLINIC_OR_DEPARTMENT_OTHER): Payer: Self-pay

## 2023-12-07 ENCOUNTER — Ambulatory Visit (HOSPITAL_BASED_OUTPATIENT_CLINIC_OR_DEPARTMENT_OTHER): Payer: Self-pay | Admitting: Pulmonary Disease

## 2023-12-07 MED ORDER — PREDNISONE 10 MG PO TABS
ORAL_TABLET | ORAL | 0 refills | Status: AC
  Filled 2023-12-07: qty 20, 8d supply, fill #0

## 2023-12-07 MED ORDER — ROSUVASTATIN CALCIUM 20 MG PO TABS
20.0000 mg | ORAL_TABLET | Freq: Every day | ORAL | 0 refills | Status: DC
  Filled 2023-12-07 – 2023-12-18 (×2): qty 15, 15d supply, fill #0

## 2023-12-07 NOTE — Telephone Encounter (Signed)
 Agent reporting both pt and wife are positive for covid, CRM created under wife's name. Nurse speaking to both pt and wife.  TRIAGE SUMMARY NOTE: Pt reporting that he tested positive for covid at home this morning, wife is also positive. Pt denies symptoms at this time, states he is not feeling any worse than normal, but pt has been experiencing allergy symptoms lately, on Friday "started having rawness in chest" but took his meds and woke up "fine" the next day, pt reporting he's been coughing a little bit more than normal since Saturday, not coughing anything up, but intermittent wheezing. Pt denies chest pain, dizziness, weakness. Pt reporting that he has been using his nebulizers "regularly anyway" including the rescue nebulizer every afternoon, using albuterol inhaler when wheezing, including this morning. Pt confirms his symptoms are improved with inhalers/nebulizers each time. Pt and wife wanting further recommendations for covid with COPD, pt states he is not interested in an appt at this time. Advised that sending HP message to pulm office for pt to receive call back. Please advise.  E2C2 Pulmonary Triage - Initial Assessment Questions "Chief Complaint (e.g., cough, sob, wheezing, fever, chills, sweat or additional symptoms) *Go to specific symptom protocol after initial questions. No symptoms, not feeling any worse than normal, no SOB more than normal Just positive this morning Allergy symptoms Last Friday night started having rawness in chest took medicine then Saturday woke up fine Coughing a little bit more than normal since saturday Not coughing anything up No chest pain, dizziness, weakness Wheezing a little bit this morning Symptoms are improved by neb/inhaler each time  "How long have symptoms been present?" Denies symptoms  Have you tested for COVID or Flu? Note: If not, ask patient if a home test can be taken. If so, instruct patient to call back for positive results. Yes  positive for covid this morning  MEDICINES:   "Have you used any OTC meds to help with symptoms?" claritin  "Have you used your inhalers/maintenance medication?" Yes If yes, "What medications?" Nebulizers regularly anyway and inhalers anora, because seasons been so bad  If inhaler, ask "How many puffs and how often?" Note: Review instructions on medication in the chart. Nebulizer 1 every morning, other one use it as needed so been using in afternoon, using inhaler this morning when wheezing  OXYGEN: "Do you wear supplemental oxygen?" No  "Do you monitor your oxygen levels?" No  Not been taking pulse ox  Reason for Disposition  [1] HIGH RISK patient (e.g., weak immune system, age > 64 years, obesity with BMI 30 or higher, pregnant, chronic lung disease or other chronic medical condition) AND [2] COVID symptoms (e.g., cough, fever)  (Exceptions: Already seen by PCP and no new or worsening symptoms.)  Answer Assessment - Initial Assessment Questions 4. WORST SYMPTOM: "What is your worst symptom?" (e.g., cough, fever, shortness of breath, muscle aches)     Denies symptoms 5. COUGH: "Do you have a cough?" If Yes, ask: "How bad is the cough?"       A little bit more than normal 7. RESPIRATORY STATUS: "Describe your breathing?" (e.g., normal; shortness of breath, wheezing, unable to speak)      Intermittent wheezing, attributes to allergies 10. HIGH RISK DISEASE: "Do you have any chronic medical problems?" (e.g., asthma, heart or lung disease, weak immune system, obesity, etc.)       COPD  Protocols used: Coronavirus (COVID-19) Diagnosed or Suspected-A-AH

## 2023-12-07 NOTE — Telephone Encounter (Signed)
Dr. Everardo All, please advise. Thanks ?

## 2023-12-07 NOTE — Telephone Encounter (Signed)
 Offered paxlovid. Patient declined. For acute bronchitis secondary to covid prednisone taper ordered.

## 2023-12-09 ENCOUNTER — Other Ambulatory Visit (HOSPITAL_BASED_OUTPATIENT_CLINIC_OR_DEPARTMENT_OTHER): Payer: Self-pay

## 2023-12-18 ENCOUNTER — Other Ambulatory Visit (HOSPITAL_BASED_OUTPATIENT_CLINIC_OR_DEPARTMENT_OTHER): Payer: Self-pay

## 2023-12-20 ENCOUNTER — Other Ambulatory Visit (HOSPITAL_BASED_OUTPATIENT_CLINIC_OR_DEPARTMENT_OTHER): Payer: Self-pay

## 2023-12-20 ENCOUNTER — Other Ambulatory Visit: Payer: Self-pay

## 2023-12-29 ENCOUNTER — Other Ambulatory Visit: Payer: Self-pay

## 2023-12-29 ENCOUNTER — Other Ambulatory Visit (HOSPITAL_BASED_OUTPATIENT_CLINIC_OR_DEPARTMENT_OTHER): Payer: Self-pay | Admitting: Pulmonary Disease

## 2023-12-29 ENCOUNTER — Other Ambulatory Visit (HOSPITAL_BASED_OUTPATIENT_CLINIC_OR_DEPARTMENT_OTHER): Payer: Self-pay

## 2023-12-29 MED ORDER — GUAIFENESIN-CODEINE 100-10 MG/5ML PO SOLN
5.0000 mL | Freq: Every evening | ORAL | 0 refills | Status: DC | PRN
  Filled 2023-12-29: qty 118, 23d supply, fill #0

## 2023-12-29 NOTE — Telephone Encounter (Signed)
 Dr Washington Hacker is out on maternity leave. I will route to Dr Gaynell Keeler as he is provider of the day and this is a controlled substance. Pt was last seen 08-27-23 by Dr Washington Hacker which is also when the rx was last written / refilled. Please advise pt refill request.

## 2024-01-04 ENCOUNTER — Other Ambulatory Visit (HOSPITAL_BASED_OUTPATIENT_CLINIC_OR_DEPARTMENT_OTHER): Payer: Self-pay

## 2024-01-04 ENCOUNTER — Other Ambulatory Visit: Payer: Self-pay | Admitting: Family Medicine

## 2024-01-05 ENCOUNTER — Other Ambulatory Visit (HOSPITAL_BASED_OUTPATIENT_CLINIC_OR_DEPARTMENT_OTHER): Payer: Self-pay

## 2024-01-08 ENCOUNTER — Other Ambulatory Visit (HOSPITAL_BASED_OUTPATIENT_CLINIC_OR_DEPARTMENT_OTHER): Payer: Self-pay

## 2024-01-12 ENCOUNTER — Other Ambulatory Visit (HOSPITAL_BASED_OUTPATIENT_CLINIC_OR_DEPARTMENT_OTHER): Payer: Self-pay

## 2024-01-12 ENCOUNTER — Other Ambulatory Visit: Payer: Self-pay | Admitting: Pulmonary Disease

## 2024-01-12 ENCOUNTER — Other Ambulatory Visit: Payer: Self-pay | Admitting: Cardiovascular Disease

## 2024-01-12 MED ORDER — ROSUVASTATIN CALCIUM 20 MG PO TABS
20.0000 mg | ORAL_TABLET | Freq: Every day | ORAL | 0 refills | Status: AC
  Filled 2024-01-12: qty 15, 15d supply, fill #0

## 2024-01-13 ENCOUNTER — Other Ambulatory Visit (HOSPITAL_BASED_OUTPATIENT_CLINIC_OR_DEPARTMENT_OTHER): Payer: Self-pay

## 2024-01-13 MED ORDER — IPRATROPIUM BROMIDE 0.06 % NA SOLN
2.0000 | Freq: Four times a day (QID) | NASAL | 5 refills | Status: AC
  Filled 2024-01-13: qty 15, 19d supply, fill #0
  Filled 2024-03-13: qty 15, 19d supply, fill #1
  Filled 2024-05-20: qty 15, 19d supply, fill #2

## 2024-01-19 ENCOUNTER — Other Ambulatory Visit: Payer: Self-pay | Admitting: Family Medicine

## 2024-01-19 ENCOUNTER — Other Ambulatory Visit (HOSPITAL_BASED_OUTPATIENT_CLINIC_OR_DEPARTMENT_OTHER): Payer: Self-pay

## 2024-01-20 ENCOUNTER — Other Ambulatory Visit (HOSPITAL_BASED_OUTPATIENT_CLINIC_OR_DEPARTMENT_OTHER): Payer: Self-pay

## 2024-01-20 MED ORDER — LOSARTAN POTASSIUM 25 MG PO TABS
25.0000 mg | ORAL_TABLET | Freq: Every day | ORAL | 4 refills | Status: AC
  Filled 2024-01-21: qty 90, 90d supply, fill #0
  Filled 2024-04-12 – 2024-07-23 (×3): qty 90, 90d supply, fill #1

## 2024-01-20 MED ORDER — ATORVASTATIN CALCIUM 40 MG PO TABS
40.0000 mg | ORAL_TABLET | Freq: Every day | ORAL | 4 refills | Status: AC
  Filled 2024-01-20 – 2024-01-21 (×2): qty 90, 90d supply, fill #0
  Filled 2024-04-12: qty 90, 90d supply, fill #1
  Filled 2024-06-07 – 2024-07-23 (×2): qty 90, 90d supply, fill #2

## 2024-01-21 ENCOUNTER — Other Ambulatory Visit (HOSPITAL_BASED_OUTPATIENT_CLINIC_OR_DEPARTMENT_OTHER): Payer: Self-pay

## 2024-01-24 ENCOUNTER — Other Ambulatory Visit (HOSPITAL_BASED_OUTPATIENT_CLINIC_OR_DEPARTMENT_OTHER): Payer: Self-pay

## 2024-01-28 ENCOUNTER — Other Ambulatory Visit (HOSPITAL_BASED_OUTPATIENT_CLINIC_OR_DEPARTMENT_OTHER): Payer: Self-pay

## 2024-01-28 MED ORDER — LATANOPROST 0.005 % OP SOLN
1.0000 [drp] | Freq: Every day | OPHTHALMIC | 4 refills | Status: DC
  Filled 2024-01-28: qty 2.5, 25d supply, fill #0
  Filled 2024-03-13: qty 2.5, 50d supply, fill #0
  Filled 2024-04-22: qty 2.5, 25d supply, fill #0

## 2024-01-28 MED ORDER — LATANOPROST 0.005 % OP SOLN
1.0000 [drp] | Freq: Every evening | OPHTHALMIC | 3 refills | Status: AC
  Filled 2024-01-28: qty 7.5, 75d supply, fill #0
  Filled 2024-03-13 – 2024-05-20 (×2): qty 7.5, 75d supply, fill #1

## 2024-02-17 ENCOUNTER — Other Ambulatory Visit (HOSPITAL_BASED_OUTPATIENT_CLINIC_OR_DEPARTMENT_OTHER): Payer: Self-pay

## 2024-02-17 MED ORDER — TRAZODONE HCL 50 MG PO TABS
25.0000 mg | ORAL_TABLET | Freq: Every evening | ORAL | 0 refills | Status: DC
  Filled 2024-02-17: qty 30, 30d supply, fill #0

## 2024-02-23 ENCOUNTER — Other Ambulatory Visit (HOSPITAL_BASED_OUTPATIENT_CLINIC_OR_DEPARTMENT_OTHER): Payer: Self-pay

## 2024-02-29 ENCOUNTER — Encounter: Payer: Self-pay | Admitting: Podiatry

## 2024-02-29 ENCOUNTER — Ambulatory Visit: Payer: PPO | Admitting: Podiatry

## 2024-02-29 VITALS — Ht 73.0 in | Wt 273.0 lb

## 2024-02-29 DIAGNOSIS — B351 Tinea unguium: Secondary | ICD-10-CM

## 2024-02-29 NOTE — Progress Notes (Signed)
  Subjective:  Patient ID: Carlos Romero, male    DOB: 1946-08-03,  MRN: [id number]  Follow-up nail fungus has been using ciclopirox   78 y.o. male presents with the above complaint. History confirmed with patient.  Returns for follow-up toenails have not gotten any worse.  Has been done with his [place]  for a while  Objective:  Physical Exam: warm, good capillary refill, no trophic changes or ulcerative lesions, normal DP and PT pulses, and normal sensory exam.  Onychomycosis improved there is residual dystrophy  Assessment:   1. Onychomycosis        Plan:  Patient was evaluated and treated and all questions answered.  Improved quite a bit.  Remaining areas of dystrophic growth were debrided as a courtesy today.  He will return to having regular pedicures.  Follow with me as needed for this or other issues.    Return if symptoms worsen or fail to improve.

## 2024-03-13 ENCOUNTER — Other Ambulatory Visit (HOSPITAL_BASED_OUTPATIENT_CLINIC_OR_DEPARTMENT_OTHER): Payer: Self-pay

## 2024-03-13 ENCOUNTER — Other Ambulatory Visit: Payer: Self-pay

## 2024-03-13 MED ORDER — TRAZODONE HCL 50 MG PO TABS
25.0000 mg | ORAL_TABLET | Freq: Every evening | ORAL | 0 refills | Status: AC | PRN
  Filled 2024-03-13: qty 30, 30d supply, fill #0

## 2024-03-14 ENCOUNTER — Other Ambulatory Visit (HOSPITAL_BASED_OUTPATIENT_CLINIC_OR_DEPARTMENT_OTHER): Payer: Self-pay

## 2024-03-20 ENCOUNTER — Other Ambulatory Visit (HOSPITAL_BASED_OUTPATIENT_CLINIC_OR_DEPARTMENT_OTHER): Payer: Self-pay

## 2024-04-12 ENCOUNTER — Other Ambulatory Visit (HOSPITAL_BASED_OUTPATIENT_CLINIC_OR_DEPARTMENT_OTHER): Payer: Self-pay

## 2024-04-12 MED ORDER — TRAZODONE HCL 100 MG PO TABS
100.0000 mg | ORAL_TABLET | Freq: Every day | ORAL | 3 refills | Status: AC
  Filled 2024-04-12: qty 90, 90d supply, fill #0
  Filled 2024-07-23: qty 90, 90d supply, fill #1

## 2024-04-13 ENCOUNTER — Other Ambulatory Visit (HOSPITAL_BASED_OUTPATIENT_CLINIC_OR_DEPARTMENT_OTHER): Payer: Self-pay

## 2024-04-13 ENCOUNTER — Other Ambulatory Visit: Payer: Self-pay

## 2024-04-23 ENCOUNTER — Other Ambulatory Visit: Payer: Self-pay

## 2024-05-01 ENCOUNTER — Other Ambulatory Visit (HOSPITAL_BASED_OUTPATIENT_CLINIC_OR_DEPARTMENT_OTHER): Payer: Self-pay

## 2024-05-18 ENCOUNTER — Other Ambulatory Visit (HOSPITAL_BASED_OUTPATIENT_CLINIC_OR_DEPARTMENT_OTHER): Payer: Self-pay

## 2024-05-20 ENCOUNTER — Other Ambulatory Visit (HOSPITAL_BASED_OUTPATIENT_CLINIC_OR_DEPARTMENT_OTHER): Payer: Self-pay

## 2024-05-20 ENCOUNTER — Encounter: Payer: Self-pay | Admitting: Podiatry

## 2024-05-22 ENCOUNTER — Other Ambulatory Visit (HOSPITAL_BASED_OUTPATIENT_CLINIC_OR_DEPARTMENT_OTHER): Payer: Self-pay

## 2024-05-22 MED ORDER — CICLOPIROX 8 % EX SOLN
Freq: Every day | CUTANEOUS | 0 refills | Status: AC
  Filled 2024-05-22: qty 6.6, 30d supply, fill #0

## 2024-06-03 ENCOUNTER — Other Ambulatory Visit (HOSPITAL_BASED_OUTPATIENT_CLINIC_OR_DEPARTMENT_OTHER): Payer: Self-pay | Admitting: Pulmonary Disease

## 2024-06-05 ENCOUNTER — Other Ambulatory Visit (HOSPITAL_BASED_OUTPATIENT_CLINIC_OR_DEPARTMENT_OTHER): Payer: Self-pay | Admitting: Pulmonary Disease

## 2024-06-05 NOTE — Telephone Encounter (Signed)
 Please advise for refill?

## 2024-06-06 NOTE — Telephone Encounter (Signed)
 Pt requesting refill of controlled medication. Please advise, thank you!  LOV: 08/27/23

## 2024-06-07 ENCOUNTER — Other Ambulatory Visit (HOSPITAL_BASED_OUTPATIENT_CLINIC_OR_DEPARTMENT_OTHER): Payer: Self-pay

## 2024-06-07 MED ORDER — GUAIFENESIN-CODEINE 100-10 MG/5ML PO SOLN
5.0000 mL | Freq: Every evening | ORAL | 0 refills | Status: DC | PRN
  Filled 2024-06-07: qty 118, 23d supply, fill #0

## 2024-06-08 ENCOUNTER — Encounter (HOSPITAL_BASED_OUTPATIENT_CLINIC_OR_DEPARTMENT_OTHER): Payer: Self-pay | Admitting: Pulmonary Disease

## 2024-06-08 ENCOUNTER — Other Ambulatory Visit: Payer: Self-pay

## 2024-06-08 ENCOUNTER — Ambulatory Visit (HOSPITAL_BASED_OUTPATIENT_CLINIC_OR_DEPARTMENT_OTHER): Admitting: Pulmonary Disease

## 2024-06-08 ENCOUNTER — Other Ambulatory Visit (HOSPITAL_BASED_OUTPATIENT_CLINIC_OR_DEPARTMENT_OTHER): Payer: Self-pay

## 2024-06-08 VITALS — BP 128/72 | HR 67 | Ht 73.0 in | Wt 264.0 lb

## 2024-06-08 DIAGNOSIS — J019 Acute sinusitis, unspecified: Secondary | ICD-10-CM

## 2024-06-08 DIAGNOSIS — J441 Chronic obstructive pulmonary disease with (acute) exacerbation: Secondary | ICD-10-CM

## 2024-06-08 MED ORDER — PREDNISONE 10 MG PO TABS
ORAL_TABLET | ORAL | 0 refills | Status: AC
Start: 2024-06-08 — End: 2024-06-16
  Filled 2024-06-08: qty 20, 8d supply, fill #0

## 2024-06-08 MED ORDER — HYDROCOD POLI-CHLORPHE POLI ER 10-8 MG/5ML PO SUER
5.0000 mL | Freq: Every evening | ORAL | 0 refills | Status: AC | PRN
  Filled 2024-06-08: qty 115, 23d supply, fill #0

## 2024-06-08 MED ORDER — UMECLIDINIUM-VILANTEROL 62.5-25 MCG/ACT IN AEPB
1.0000 | INHALATION_SPRAY | Freq: Every day | RESPIRATORY_TRACT | 11 refills | Status: AC
  Filled 2024-06-08 – 2024-07-03 (×2): qty 60, 30d supply, fill #0
  Filled 2024-07-27: qty 60, 30d supply, fill #1
  Filled 2024-08-29: qty 60, 30d supply, fill #2
  Filled 2024-09-26 (×2): qty 60, 30d supply, fill #3

## 2024-06-08 MED ORDER — AMOXICILLIN-POT CLAVULANATE 875-125 MG PO TABS
1.0000 | ORAL_TABLET | Freq: Two times a day (BID) | ORAL | 0 refills | Status: DC
  Filled 2024-06-08: qty 14, 7d supply, fill #0

## 2024-06-08 NOTE — Patient Instructions (Signed)
 Acute sinusitis/bronchitis COPD exacerbation --START prednisone  taper --START augmentin  --STOP doxycycline  (ineffective now)  Moderate-severe COPD with asthma overlap (FEV 52%) COVID-19 long hauler with dyspnea --Encourage rest, hydration, warm beverages including tea with honey --CONTINUE Anoro ONE puff ONCE a day --CONTINUE budesonide  nebulize 1-2 times a day --CONTINUE Albuterol  AS NEEDED for shortness of breath or wheezing --CONTINUE Duonebs AS NEEDED for severe shortness of breath or wheezing --CONTINUE mucinex  for 7 days --RE-ORDER tussionex. Can also use OTC like robitussin

## 2024-06-08 NOTE — Progress Notes (Signed)
 Subjective:   PATIENT ID: Carlos Marcey Ahle GENDER: male DOB: [birth year], MRN: [id number]    Chief Complaint  Patient presents with   Follow-up   Reason for Visit: Follow-up  Carlos Romero is a 78 year old male former smoker with COPD, COVID-19 in 2021/06/14, hx childhood asthma, prostate cancer s/p prostatectomy in 1999 and radiation in 2002 and solitary kidney who presents for follow-up.  Synopsis: Since his diagnosis of COVID Jun 14, 2021, he reports that he has decreased stamina and shortness of breath with activity. He previously went the the gym 4-5 times a week, using treadmill and machines. Walking upstairs is difficult. Uses albuterol  once a week. Minimal cough usually during the night. Uses cough syrup twice a week. Did not tolerate Breztri . On Duonebs TID for COPD maintenance. 2022 - COVID in 06/14/24. Treated for COPD exacerbation in Dec 2023 - Ankle surgery>back to exercising 3x/week. Not on maintenance inhalers. PRN Duonbs and albuterol . Treated for COPD exacerbation in Dec 2024 - COPD exacerbation in March and October. Frequent sinus issues. On Anoro, daily Pulmicort  and PRN albuterol . His brother passed away in 06-14-2024 and I assisted in his care.  08/27/23 Since our last visit COPD exacerbation telephone call 03/30/23. Budesonide  daily was added to his regimen of Anoro, compliant with this regimen and feels it is doing well. Still going to the gym 3-4 times a week with 1 hour of cardio. Has nasal congestion.  09/15/23 He presents for acute visit for video. He reports worsening productive cough in the last five days. Associated with wheezing and shortness of breath. Mild nasal congestion. Denies fevers or chills or sinus issues. Cough syrup is not helping like it usually does, currently using once a day.  06/08/24 Since our last visit he had covid in April. He was recently seen by [facility] two weeks ago for URI and given prednisone  but had persistent symptoms so he returned to  clinic one a week ago and diagnosed with bronchitis and treated with doxycycline  and tessalon perles. He continues to have frequent productive cough at night and shortness of breath. Denies wheezing. Feels prednisone  course of 50 mg daily x 5 was too short and continues to have purulent nasal drainage >14 days now.  Social History: Quit smoking 30 years ago.  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  Past Medical History:  Diagnosis Date   Asthma    Barrett's esophagus    Bimalleolar fracture of right ankle    Chronic cough    onset Oct 2010, better after prednisone  short course 12-2009, sinus CT 11-06-10 bilateral maxillary, ethmoid and possibly frontal sinusitis>21 days augmentin    COPD (chronic obstructive pulmonary disease) (HCC)    gold stage 2, PFTs 06-12-09 FEV1 2.25 (65%) ratio 62 and no better with B2   Depression 08/24/1997   GERD (gastroesophageal reflux disease)    Hyperlipidemia    Hypertension    Lung disease    LLL air space disease   Prostate cancer (HCC) 08/24/2001   PUD (peptic ulcer disease) 08/24/1965   s/p perf   Pulmonary embolism (HCC) 08/24/1965   Solitary lung nodule    RML     Allergies  Allergen Reactions   Breztri  Aerosphere [Budeson-Glycopyrrol-Formoterol ] Cough   Fluoxetine Other (See Comments)    Vivid dreams   Fluticasone -Umeclidin-Vilant     Other reaction(s): increased cough   Lansoprazole     Other reaction(s): diarrhea   Morphine     REACTION: feels crazy   Other  Other reaction(s): vivid dreams   Venlafaxine     Other reaction(s): Other (See Comments)   Budesonide  Other (See Comments)    Thrush   Morphine Sulfate Anxiety   Prozac [Fluoxetine Hcl] Anxiety     Outpatient Medications Prior to Visit  Medication Sig Dispense Refill   albuterol  (VENTOLIN  HFA) 108 (90 Base) MCG/ACT inhaler Inhale 2 puffs into the lungs every 6 (six) hours as needed for wheezing or shortness of breath. 8 Romero 0   aspirin  EC 81 MG tablet Take 1 tablet (81 mg total)  by mouth daily. Swallow whole. 30 tablet 11   atorvastatin  (LIPITOR) 40 MG tablet Take 1 tablet (40 mg total) by mouth daily. 90 tablet 4   budesonide  (PULMICORT ) 0.5 MG/2ML nebulizer solution Take 2 mLs (0.5 mg total) by nebulization daily. 60 mL 5   ciclopirox  (PENLAC ) 8 % solution Apply topically at bedtime. Apply over nail and surrounding skin. Apply daily over previous coat. After seven (7) days, may remove with alcohol and continue cycle. 6.6 mL 0   diphenhydrAMINE (BENADRYL) 25 MG tablet Take 25 mg by mouth at bedtime as needed for allergies.     ipratropium (ATROVENT ) 0.06 % nasal spray Place 2 sprays into both nostrils 4 (four) times daily. 15 mL 5   ipratropium-albuterol  (DUONEB) 0.5-2.5 (3) MG/3ML SOLN Take 3 mLs by nebulization every 6 (six) hours as needed. TWICE A DAY 1080 mL 2   latanoprost  (XALATAN ) 0.005 % ophthalmic solution Place 1 drop into both eyes at bedtime.     latanoprost  (XALATAN ) 0.005 % ophthalmic solution Place 1 drop into both eyes every evening. 7.5 mL 3   loperamide (IMODIUM A-D) 2 MG tablet Take 2 mg by mouth 4 (four) times daily as needed for diarrhea or loose stools.     losartan  (COZAAR ) 25 MG tablet Take 1 tablet (25 mg total) by mouth daily. 90 tablet 3   losartan  (COZAAR ) 25 MG tablet Take 1 tablet (25 mg total) by mouth daily. 90 tablet 4   nystatin -triamcinolone  (MYCOLOG II) cream Apply to affected area topically 2 (two) times a day. 15 Romero 1   pantoprazole  (PROTONIX ) 40 MG tablet Take 1 tablet (40 mg total) by mouth daily. 90 tablet 4   rosuvastatin  (CRESTOR ) 20 MG tablet Take 1 tablet (20 mg total) by mouth daily. 15 tablet 0   traZODone  (DESYREL ) 100 MG tablet Take 1 tablet (100 mg total) by mouth at bedtime. 90 tablet 3   traZODone  (DESYREL ) 50 MG tablet Take 0.5-1 tablets (25-50 mg total) by mouth at bedtime as needed for sleep 30 tablet 0   valACYclovir  (VALTREX ) 1000 MG tablet Take 2 tablets (2,000 mg total) by mouth at onset of symptoms and repeat in  12 hours. 4 tablet 1   doxycycline  (VIBRA -TABS) 100 MG tablet Take 1 tablet (100 mg total) by mouth 2 (two) times daily. 14 tablet 0   guaiFENesin -codeine  100-10 MG/5ML syrup Take 5 mLs by mouth at bedtime as needed for cough. 118 mL 0   umeclidinium-vilanterol (ANORO ELLIPTA ) 62.5-25 MCG/ACT AEPB Inhale 1 puff into the lungs daily. 60 each 11   No facility-administered medications prior to visit.    Review of Systems  Constitutional:  Negative for chills, diaphoresis, fever, malaise/fatigue and weight loss.  HENT:  Positive for congestion.   Respiratory:  Positive for cough, sputum production and shortness of breath. Negative for hemoptysis and wheezing.   Cardiovascular:  Negative for chest pain, palpitations and leg swelling.  Objective:   Vitals:   06/08/24 1439  BP: 128/72  Pulse: 67  SpO2: 97%  Weight: 264 lb (119.7 kg)  Height: 6' 1 (1.854 m)   SpO2: 97 %  Physical Exam: General: Well-appearing, no acute distress HENT: [place], AT Eyes: EOMI, no scleral icterus Respiratory: Coarse breath sounds bilaterally R>L.  No crackles, wheezing or rales Cardiovascular: RRR, -M/R/Romero, no JVD Extremities:-Edema,-tenderness Neuro: AAO x4, CNII-XII grossly intact Psych: Normal mood, normal affect   Data Reviewed:  Imaging: CT Chest 07/05/18 - Minimal hazy mosaic attenuation throughout. No pulmonary nodules noted.  PFT: 05/24/20 FVC 2.8 (57%) FEV1 1.9 (52%) Ratio 67  Interpretation: Moderately severe obstructive defect  Labs: CBC    Component Value Date/Time   WBC 4.0 (L) 01/12/2011 1204   RBC 4.28 01/12/2011 1204   HGB 13.6 01/12/2011 1204   HCT 39.1 01/12/2011 1204   PLT 195.0 01/12/2011 1204   MCV 91.3 01/12/2011 1204   MCH 31.0 11/15/2010 1845   MCHC 34.8 01/12/2011 1204   RDW 14.2 01/12/2011 1204   LYMPHSABS 1.4 01/12/2011 1204   MONOABS 0.3 01/12/2011 1204   EOSABS 0.1 01/12/2011 1204   BASOSABS 0.0 01/12/2011 1204   Abs eos 01/12/11 - 100    Assessment &  Plan:   Discussion: 78 year old male former smoker with childhood asthma and moderately severe COPD/asthma who presents for follow-up. Symptoms c/w acute rhinosinusitis and persistent COPD exacerbation. Discussed clinical course and management of COPD/asthma including bronchodilator regimen, preventive care including vaccinations (hold on influenza and RSV until acute symptoms resolve) and action plan for exacerbation.  Prior inhalers: Stiolto - not well-controlled Breztri  - intolerance due to thrush even with spacer  Acute sinusitis/bronchitis COPD exacerbation --START prednisone  taper --START augmentin  --STOP doxycycline  (ineffective now)  Moderate-severe COPD with asthma overlap (FEV 52%), currently in persistent exacerbation COVID-19 long hauler with dyspnea --Encourage rest, hydration, warm beverages including tea with honey --CONTINUE Anoro ONE puff ONCE a day --CONTINUE budesonide  nebulize 1-2 times a day --CONTINUE Albuterol  AS NEEDED for shortness of breath or wheezing --CONTINUE Duonebs AS NEEDED for severe shortness of breath or wheezing --Encourage regular aerobic exercise --CONTINUE mucinex  for 7 days --RE-ORDER tussionex. Can also use OTC like robitussin  Nasal congestion --CONTINUE zyrtec (generic: cetirizine) once a day --Recommend atrovent  nasal spray twice a day as needed --AVOID Afrin  Health Maintenance Immunization History  Administered Date(s) Administered   Fluad Quad(high Dose 65+) 05/20/2022   Fluzone Influenza virus vaccine,trivalent (IIV3), split virus 06/25/2014, 05/23/2015, 04/10/2019, 07/08/2021   INFLUENZA, HIGH DOSE SEASONAL PF 07/17/2014, 07/22/2016, 05/28/2017, 06/24/2017, 04/10/2019, 07/03/2021   Influenza Inj Mdck Quad Pf 06/02/2018   Influenza Split 06/29/2008, 07/04/2009, 06/14/2014   Influenza Whole 05/24/2009, 10/27/2010   Influenza,inj,Quad PF,6+ Mos 06/06/2015   PFIZER(Purple Top)SARS-COV-2 Vaccination 10/05/2019, 10/30/2019,  05/20/2020, 12/06/2020   Pfizer(Comirnaty )Fall Seasonal Vaccine 12 years and older 07/27/2023   Pneumococcal Conjugate-13 11/05/2014   Pneumococcal Polysaccharide-23 08/06/2011, 04/14/2014   Respiratory Syncytial Virus Vaccine,Recomb Aduvanted(Arexvy) 05/20/2022   Td 09/01/2005   Unspecified SARS-COV-2 Vaccination 06/04/2022    CT Lung Screen - not qualified  No orders of the defined types were placed in this encounter.  Meds ordered this encounter  Medications   umeclidinium-vilanterol (ANORO ELLIPTA ) 62.5-25 MCG/ACT AEPB    Sig: Inhale 1 puff into the lungs daily.    Dispense:  60 each    Refill:  11   chlorpheniramine-HYDROcodone (TUSSIONEX) 10-8 MG/5ML    Sig: Take 5 mLs by mouth at bedtime as needed for  cough.    Dispense:  115 mL    Refill:  0   predniSONE  (DELTASONE ) 10 MG tablet    Sig: Take 4 tablets (40 mg total) by mouth daily with breakfast for 2 days, THEN 3 tablets (30 mg total) daily with breakfast for 2 days, THEN 2 tablets (20 mg total) daily with breakfast for 2 days, THEN 1 tablet (10 mg total) daily with breakfast for 2 days.    Dispense:  20 tablet    Refill:  0   amoxicillin -clavulanate (AUGMENTIN ) 875-125 MG tablet    Sig: Take 1 tablet by mouth 2 (two) times daily.    Dispense:  14 tablet    Refill:  0    Return in about 3 months (around 09/08/2024).   I have spent a total time of 31-minutes on the day of the appointment including chart review, data review, collecting history, coordinating care and discussing medical diagnosis and plan with the patient/family. Past medical history, allergies, medications were reviewed. Pertinent imaging, labs and tests included in this note have been reviewed and interpreted independently by me.  Johnasia Liese Slater Staff, MD [facility] 06/08/2024 3:30 PM

## 2024-06-12 ENCOUNTER — Other Ambulatory Visit: Payer: Self-pay

## 2024-06-12 ENCOUNTER — Encounter (HOSPITAL_COMMUNITY): Payer: Self-pay

## 2024-06-12 ENCOUNTER — Emergency Department (HOSPITAL_COMMUNITY)
Admission: EM | Admit: 2024-06-12 | Discharge: 2024-06-13 | Discharge status: Against Medical Advice | Attending: Emergency Medicine | Admitting: Emergency Medicine

## 2024-06-12 DIAGNOSIS — Z5321 Procedure and treatment not carried out due to patient leaving prior to being seen by health care provider: Secondary | ICD-10-CM | POA: Diagnosis not present

## 2024-06-12 DIAGNOSIS — R319 Hematuria, unspecified: Secondary | ICD-10-CM | POA: Diagnosis present

## 2024-06-12 DIAGNOSIS — S3730XA Unspecified injury of urethra, initial encounter: Secondary | ICD-10-CM | POA: Diagnosis present

## 2024-06-12 DIAGNOSIS — Z7982 Long term (current) use of aspirin: Secondary | ICD-10-CM | POA: Diagnosis not present

## 2024-06-12 DIAGNOSIS — X58XXXA Exposure to other specified factors, initial encounter: Secondary | ICD-10-CM | POA: Diagnosis not present

## 2024-06-12 DIAGNOSIS — R300 Dysuria: Secondary | ICD-10-CM | POA: Diagnosis not present

## 2024-06-12 NOTE — ED Triage Notes (Addendum)
 Pt from home via POV with wife.  Pt reports requiring urinary catheterization every two to three weeks.  Pt states he self cathed today and when he removed catheter he felt a ripping pain, then noted bleeding.  Pt states he is still bleeding but it is more controlled.  Pt states when he next urinated it burned.    Pt denies blood thinners.  RN viewed site and notes small amount of blood in brief but no active bleeding.

## 2024-06-13 ENCOUNTER — Emergency Department (HOSPITAL_BASED_OUTPATIENT_CLINIC_OR_DEPARTMENT_OTHER)
Admission: EM | Admit: 2024-06-13 | Discharge: 2024-06-13 | Disposition: A | Discharge status: Home / Self Care | Source: Home / Self Care | Attending: Emergency Medicine | Admitting: Emergency Medicine

## 2024-06-13 DIAGNOSIS — S3730XA Unspecified injury of urethra, initial encounter: Secondary | ICD-10-CM | POA: Insufficient documentation

## 2024-06-13 DIAGNOSIS — X58XXXA Exposure to other specified factors, initial encounter: Secondary | ICD-10-CM | POA: Insufficient documentation

## 2024-06-13 DIAGNOSIS — R319 Hematuria, unspecified: Secondary | ICD-10-CM | POA: Insufficient documentation

## 2024-06-13 DIAGNOSIS — Z7982 Long term (current) use of aspirin: Secondary | ICD-10-CM | POA: Insufficient documentation

## 2024-06-13 LAB — URINALYSIS, W/ REFLEX TO CULTURE (INFECTION SUSPECTED)
Bilirubin Urine: NEGATIVE
Glucose, UA: NEGATIVE mg/dL
Ketones, ur: NEGATIVE mg/dL
Nitrite: NEGATIVE
RBC / HPF: >50 RBC/hpf (ref 0–5)
Specific Gravity, Urine: 1.023 (ref 1.005–1.030)
pH: 5.5 (ref 5.0–8.0)

## 2024-06-13 NOTE — ED Provider Notes (Signed)
 [place] EMERGENCY DEPARTMENT AT [facility] Provider Note   CSN: [id number] Arrival date & time: 06/13/24  9762     Patient presents with: No chief complaint on file.   Carlos Romero is a 78 y.o. male.    history of prostatectomy and radiation therapy, presenting with significant urethral bleeding following self-catheterization earlier today. He reports that after removing the catheter post-shower, he experienced a sensation of tearing followed by profuse bleeding, which he managed to slow down after approximately fifteen minutes. The bleeding recurred upon subsequent attempts to urinate, accompanied by burning pain during urination. The patient typically self-catheterizes bi-monthly due to a urethral stricture, which he monitors by the decreasing stream size. He denies using catheters with balloons and has an established care plan with his urologist, Dr. Renay Moats, with a follow-up appointment scheduled for Thursday. The history was obtained from the patient.        Prior to Admission medications   Medication Sig Start Date End Date Taking? Authorizing Provider  albuterol  (VENTOLIN  HFA) 108 (90 Base) MCG/ACT inhaler Inhale 2 puffs into the lungs every 6 (six) hours as needed for wheezing or shortness of breath. 05/26/22   Kassie Acquanetta Bradley, MD  amoxicillin -clavulanate (AUGMENTIN ) 875-125 MG tablet Take 1 tablet by mouth 2 (two) times daily. 06/08/24   Kassie Acquanetta Bradley, MD  aspirin  EC 81 MG tablet Take 1 tablet (81 mg total) by mouth daily. Swallow whole. 06/10/20   O'NealDarryle Ned, MD  atorvastatin  (LIPITOR) 40 MG tablet Take 1 tablet (40 mg total) by mouth daily. 01/20/24     budesonide  (PULMICORT ) 0.5 MG/2ML nebulizer solution Take 2 mLs (0.5 mg total) by nebulization daily. 08/27/23 06/22/24  Kassie Acquanetta Bradley, MD  chlorpheniramine-HYDROcodone (TUSSIONEX) 10-8 MG/5ML Take 5 mLs by mouth at bedtime as needed for cough. 06/08/24   Kassie Acquanetta Bradley, MD  ciclopirox  (PENLAC )  8 % solution Apply topically at bedtime. Apply over nail and surrounding skin. Apply daily over previous coat. After seven (7) days, may remove with alcohol and continue cycle. 05/22/24   McDonald, Juliene SAUNDERS, DPM  diphenhydrAMINE (BENADRYL) 25 MG tablet Take 25 mg by mouth at bedtime as needed for allergies.    [provider]  ipratropium (ATROVENT ) 0.06 % nasal spray Place 2 sprays into both nostrils 4 (four) times daily. 01/13/24   Kassie Acquanetta Bradley, MD  ipratropium-albuterol  (DUONEB) 0.5-2.5 (3) MG/3ML SOLN Take 3 mLs by nebulization every 6 (six) hours as needed. TWICE A DAY 07/24/21   Kassie Acquanetta Bradley, MD  latanoprost  (XALATAN ) 0.005 % ophthalmic solution Place 1 drop into both eyes at bedtime.    [provider]  latanoprost  (XALATAN ) 0.005 % ophthalmic solution Place 1 drop into both eyes every evening. 01/28/24     loperamide (IMODIUM A-D) 2 MG tablet Take 2 mg by mouth 4 (four) times daily as needed for diarrhea or loose stools.    [provider]  losartan  (COZAAR ) 25 MG tablet Take 1 tablet (25 mg total) by mouth daily. 07/27/23     losartan  (COZAAR ) 25 MG tablet Take 1 tablet (25 mg total) by mouth daily. 01/20/24     nystatin -triamcinolone  (MYCOLOG II) cream Apply to affected area topically 2 (two) times a day. 06/14/23     pantoprazole  (PROTONIX ) 40 MG tablet Take 1 tablet (40 mg total) by mouth daily. 12/01/23   Darlean Ozell NOVAK, MD  predniSONE  (DELTASONE ) 10 MG tablet Take 4 tablets (40 mg total) by mouth daily with breakfast for 2  days, THEN 3 tablets (30 mg total) daily with breakfast for 2 days, THEN 2 tablets (20 mg total) daily with breakfast for 2 days, THEN 1 tablet (10 mg total) daily with breakfast for 2 days. 06/08/24 06/16/24  Kassie Acquanetta Bradley, MD  rosuvastatin  (CRESTOR ) 20 MG tablet Take 1 tablet (20 mg total) by mouth daily. 01/12/24 01/06/25  O'NealDarryle Ned, MD  traZODone  (DESYREL ) 100 MG tablet Take 1 tablet (100 mg total) by mouth at bedtime. 04/12/24      traZODone  (DESYREL ) 50 MG tablet Take 0.5-1 tablets (25-50 mg total) by mouth at bedtime as needed for sleep 03/13/24     umeclidinium-vilanterol (ANORO ELLIPTA ) 62.5-25 MCG/ACT AEPB Inhale 1 puff into the lungs daily. 06/08/24   Kassie Acquanetta Bradley, MD  valACYclovir  (VALTREX ) 1000 MG tablet Take 2 tablets (2,000 mg total) by mouth at onset of symptoms and repeat in 12 hours. 11/17/22       Allergies: Breztri  aerosphere [budeson-glycopyrrol-formoterol ], Fluoxetine, Fluticasone -umeclidin-vilant, Lansoprazole, Morphine, Other, Venlafaxine, Budesonide , Morphine sulfate, and Prozac [fluoxetine hcl]    Review of Systems  Updated Vital Signs BP (!) 143/79   Pulse 76   Temp 98.7 F (37.1 C) (Oral)   Resp 17   SpO2 94%   Physical Exam Vitals and nursing note reviewed.  Constitutional:      Appearance: He is well-developed.  HENT:     Head: Normocephalic and atraumatic.  Cardiovascular:     Rate and Rhythm: Normal rate.  Pulmonary:     Effort: Pulmonary effort is normal. No respiratory distress.  Abdominal:     General: There is no distension.  Musculoskeletal:        General: Normal range of motion.     Cervical back: Normal range of motion.  Neurological:     Mental Status: He is alert.     (all labs ordered are listed, but only abnormal results are displayed) Labs Reviewed  URINALYSIS, W/ REFLEX TO CULTURE (INFECTION SUSPECTED) - Abnormal; Notable for the following components:      Result Value   APPearance HAZY (*)    Hgb urine dipstick LARGE (*)    Protein, ur TRACE (*)    Leukocytes,Ua SMALL (*)    Bacteria, UA RARE (*)    All other components within normal limits  URINE CULTURE    EKG: None  Radiology: No results found.   Procedures   Medications Ordered in the ED - No data to display                                  Medical Decision Making  The patient presented with significant urethral bleeding following self-catheterization. The patient reported a  history of urethral stricture secondary to radiation therapy after prostate removal. Upon examination, it was determined that the bleeding likely resulted from a tear in the urethra. A catheter was placed to allow the urethra to heal and prevent further irritation. The urine test showed blood, as expected, and a culture was sent for further analysis. The patient was advised to follow up with their urologist, Dr. Renay Moats, on Thursday for further management.  Differential Diagnosis: Differential diagnosis includes but is not limited to: urethral tear, urethral stricture, urinary tract infection, and post-radiation urethral damage.  Diagnostics Review  Laboratory Interpretation (Interpreted by me): Urinalysis showed blood in the urine; culture results pending.  Management  Medications: None administered in the ED.  Re-Evaluations/Course of Care:  The patient was re-evaluated after catheter placement, and the urine showed a pink tinge initially, which then turned yellow, indicating improvement. The patient was advised to monitor for excessive bleeding or clots and to return if these occurred.   Final diagnoses:  Hematuria, unspecified type  Injury of urethra, initial encounter    ED Discharge Orders     None          Liese Dizdarevic, Selinda, MD 06/13/24 [phone]

## 2024-06-13 NOTE — ED Triage Notes (Signed)
 Self caths every 2-3 weeks due to stricture. Today while removing cath felt sharp pain followed by bleeding. Cath around 1400. No thinners. Seen at [facility] earlier 10/20.

## 2024-06-13 NOTE — ED Notes (Signed)
 Leg bag exchanged for standard drainage bag; patient verbalized understanding of how to drain device for home use

## 2024-06-13 NOTE — ED Notes (Signed)
 Attempted 2x for blood draw got flash however was unable get blood for labs.

## 2024-06-13 NOTE — ED Notes (Signed)
 Pt has decided to leave due to long wait.

## 2024-06-14 LAB — URINE CULTURE: Culture: NO GROWTH

## 2024-07-03 ENCOUNTER — Other Ambulatory Visit (HOSPITAL_BASED_OUTPATIENT_CLINIC_OR_DEPARTMENT_OTHER): Payer: Self-pay

## 2024-07-03 ENCOUNTER — Other Ambulatory Visit: Payer: Self-pay

## 2024-07-04 ENCOUNTER — Other Ambulatory Visit (HOSPITAL_BASED_OUTPATIENT_CLINIC_OR_DEPARTMENT_OTHER): Payer: Self-pay

## 2024-07-11 ENCOUNTER — Other Ambulatory Visit (HOSPITAL_BASED_OUTPATIENT_CLINIC_OR_DEPARTMENT_OTHER): Payer: Self-pay

## 2024-07-11 MED ORDER — FLUZONE HIGH-DOSE 0.5 ML IM SUSY
0.5000 mL | PREFILLED_SYRINGE | Freq: Once | INTRAMUSCULAR | 0 refills | Status: AC
  Filled 2024-07-11: qty 0.5, 1d supply, fill #0

## 2024-07-17 ENCOUNTER — Other Ambulatory Visit (HOSPITAL_BASED_OUTPATIENT_CLINIC_OR_DEPARTMENT_OTHER): Payer: Self-pay

## 2024-07-17 MED ORDER — AREXVY 120 MCG/0.5ML IM SUSR
0.5000 mL | Freq: Once | INTRAMUSCULAR | 0 refills | Status: AC
  Filled 2024-07-17: qty 0.5, 1d supply, fill #0

## 2024-08-22 ENCOUNTER — Other Ambulatory Visit (HOSPITAL_BASED_OUTPATIENT_CLINIC_OR_DEPARTMENT_OTHER): Payer: Self-pay

## 2024-08-22 ENCOUNTER — Encounter (HOSPITAL_BASED_OUTPATIENT_CLINIC_OR_DEPARTMENT_OTHER): Payer: Self-pay | Admitting: Pulmonary Disease

## 2024-08-22 DIAGNOSIS — J209 Acute bronchitis, unspecified: Secondary | ICD-10-CM

## 2024-08-22 MED ORDER — AZITHROMYCIN 250 MG PO TABS
ORAL_TABLET | ORAL | 0 refills | Status: DC
  Filled 2024-08-22: qty 6, 5d supply, fill #0

## 2024-08-22 MED ORDER — PREDNISONE 10 MG PO TABS
10.0000 mg | ORAL_TABLET | Freq: Every day | ORAL | 0 refills | Status: AC
  Filled 2024-08-22: qty 20, 20d supply, fill #0

## 2024-08-22 NOTE — Telephone Encounter (Signed)
 Please advise in Dr Washington Hacker absence

## 2024-08-29 ENCOUNTER — Other Ambulatory Visit (HOSPITAL_BASED_OUTPATIENT_CLINIC_OR_DEPARTMENT_OTHER): Payer: Self-pay

## 2024-09-04 ENCOUNTER — Other Ambulatory Visit (HOSPITAL_BASED_OUTPATIENT_CLINIC_OR_DEPARTMENT_OTHER): Payer: Self-pay

## 2024-09-13 ENCOUNTER — Encounter (HOSPITAL_BASED_OUTPATIENT_CLINIC_OR_DEPARTMENT_OTHER): Payer: Self-pay | Admitting: Pulmonary Disease

## 2024-09-13 ENCOUNTER — Other Ambulatory Visit (HOSPITAL_BASED_OUTPATIENT_CLINIC_OR_DEPARTMENT_OTHER): Payer: Self-pay

## 2024-09-13 ENCOUNTER — Ambulatory Visit (HOSPITAL_BASED_OUTPATIENT_CLINIC_OR_DEPARTMENT_OTHER)

## 2024-09-13 ENCOUNTER — Ambulatory Visit (HOSPITAL_BASED_OUTPATIENT_CLINIC_OR_DEPARTMENT_OTHER): Admitting: Pulmonary Disease

## 2024-09-13 VITALS — BP 115/74 | HR 65 | Ht 73.0 in | Wt 266.1 lb

## 2024-09-13 DIAGNOSIS — J449 Chronic obstructive pulmonary disease, unspecified: Secondary | ICD-10-CM

## 2024-09-13 DIAGNOSIS — Z87891 Personal history of nicotine dependence: Secondary | ICD-10-CM | POA: Diagnosis not present

## 2024-09-13 DIAGNOSIS — R0981 Nasal congestion: Secondary | ICD-10-CM

## 2024-09-13 DIAGNOSIS — J4489 Other specified chronic obstructive pulmonary disease: Secondary | ICD-10-CM

## 2024-09-13 LAB — PULMONARY FUNCTION TEST
DL/VA % pred: 103 %
DL/VA: 4 ml/min/mmHg/L
DLCO unc % pred: 71 %
DLCO unc: 19.44 ml/min/mmHg
FEF 25-75 Post: 1.39 L/s
FEF 25-75 Pre: 0.92 L/s
FEF2575-%Change-Post: 50 %
FEF2575-%Pred-Post: 58 %
FEF2575-%Pred-Pre: 38 %
FEV1-%Change-Post: 13 %
FEV1-%Pred-Post: 64 %
FEV1-%Pred-Pre: 56 %
FEV1-Post: 2.14 L
FEV1-Pre: 1.88 L
FEV1FVC-%Change-Post: 7 %
FEV1FVC-%Pred-Pre: 84 %
FEV6-%Change-Post: 7 %
FEV6-%Pred-Post: 73 %
FEV6-%Pred-Pre: 68 %
FEV6-Post: 3.19 L
FEV6-Pre: 2.96 L
FEV6FVC-%Change-Post: 0 %
FEV6FVC-%Pred-Post: 104 %
FEV6FVC-%Pred-Pre: 104 %
FVC-%Change-Post: 5 %
FVC-%Pred-Post: 70 %
FVC-%Pred-Pre: 66 %
FVC-Post: 3.25 L
FVC-Pre: 3.08 L
Post FEV1/FVC ratio: 66 %
Post FEV6/FVC ratio: 98 %
Pre FEV1/FVC ratio: 61 %
Pre FEV6/FVC Ratio: 98 %
RV % pred: 123 %
RV: 3.44 L
TLC % pred: 85 %
TLC: 6.56 L

## 2024-09-13 MED ORDER — ALBUTEROL SULFATE HFA 108 (90 BASE) MCG/ACT IN AERS
2.0000 | INHALATION_SPRAY | Freq: Four times a day (QID) | RESPIRATORY_TRACT | 1 refills | Status: AC | PRN
  Filled 2024-09-13: qty 6.7, 25d supply, fill #0

## 2024-09-13 MED ORDER — BUDESONIDE 0.5 MG/2ML IN SUSP
0.5000 mg | Freq: Every day | RESPIRATORY_TRACT | 5 refills | Status: AC
  Filled 2024-09-13: qty 60, 30d supply, fill #0

## 2024-09-13 NOTE — Patient Instructions (Signed)
 Moderate-severe COPD with asthma overlap (FEV 52%) COVID-19 long hauler with dyspnea --CONTINUE Anoro ONE puff ONCE a day --CONTINUE budesonide  nebulizer 1-2 times a day --CONTINUE Albuterol  handheld AS NEEDED for shortness of breath or wheezing --CONTINUE Duonebs AS NEEDED for severe shortness of breath or wheezing --REFER to pulmonary rehab --CONTINUE tussionex as needed. Can also use OTC like robitussin --ORDER pulmonary function test. Schedule when next available  Nasal congestion --CONTINUE zyrtec (generic: cetirizine) once a day

## 2024-09-13 NOTE — Progress Notes (Signed)
 Full PFT performed today.

## 2024-09-13 NOTE — Patient Instructions (Signed)
 Full PFT performed today.

## 2024-09-13 NOTE — Telephone Encounter (Signed)
 FYI

## 2024-09-13 NOTE — Progress Notes (Signed)
 "    Subjective:   PATIENT ID: Carlos Romero, MRN: [id number]    Chief Complaint  Patient presents with   COPD   Reason for Visit: Follow-up  [name] is a 79 year old male former smoker with COPD, COVID-19 in 05-19-2021, hx childhood asthma, prostate cancer s/p prostatectomy in 1999 and radiation in 2002 and solitary kidney who presents for follow-up.  Synopsis: Since his diagnosis of COVID 05-19-21, he reports that he has decreased stamina and shortness of breath with activity. He previously went the the gym 4-5 times a week, using treadmill and machines. Walking upstairs is difficult. Uses albuterol  once a week. Minimal cough usually during the night. Uses cough syrup twice a week. Did not tolerate Breztri . On Duonebs TID for COPD maintenance. 2022 - COVID in May 19, 2025. Treated for COPD exacerbation in Dec 2023 - Ankle surgery>back to exercising 3x/week. Not on maintenance inhalers. PRN Duonbs and albuterol . Treated for COPD exacerbation in Dec 2024 - COPD exacerbation in March and October. Frequent sinus issues. On Anoro, daily Pulmicort  and PRN albuterol . His brother passed away in 2025/05/19 and I assisted in his care. 2025 - COPD exacerbation in January, Sept, October, December. On daily budesonide  daily and Anoro  09/13/24 Since our last visit he had cough, wheezing and scratchy throat communicated via mychart. He was prescribed prednisone  and augmentin . He reports worsening shortness of breath with activity. Denies associated wheezing or cough. Has been compliant with Anoro. Using nebulizer three times a week.   Social History: Quit smoking 30 years ago.  Worked in engineering geologist and hotel  Past Medical History:  Diagnosis Date   Asthma    Barrett's esophagus    Bimalleolar fracture of right ankle    Chronic cough    onset Oct 2010, better after prednisone  short course 12-2009, sinus CT 11-06-10 bilateral maxillary, ethmoid and possibly frontal  sinusitis>21 days augmentin    COPD (chronic obstructive pulmonary disease) (HCC)    gold stage 2, PFTs 06-12-09 FEV1 2.25 (65%) ratio 62 and no better with B2   Depression 08/24/1997   GERD (gastroesophageal reflux disease)    Hyperlipidemia    Hypertension    Lung disease    LLL air space disease   Prostate cancer (HCC) 08/24/2001   PUD (peptic ulcer disease) 08/24/1965   s/p perf   Pulmonary embolism (HCC) 08/24/1965   Solitary lung nodule    RML     Allergies  Allergen Reactions   Breztri  Aerosphere [Budeson-Glycopyrrol-Formoterol ] Cough   Fluoxetine Other (See Comments)    Vivid dreams   Fluticasone -Umeclidin-Vilant     Other reaction(s): increased cough   Lansoprazole     Other reaction(s): diarrhea   Morphine     REACTION: feels crazy   Other     Other reaction(s): vivid dreams   Venlafaxine     Other reaction(s): Other (See Comments)   Budesonide  Other (See Comments)    Thrush   Morphine Sulfate Anxiety   Prozac [Fluoxetine Hcl] Anxiety     Outpatient Medications Prior to Visit  Medication Sig Dispense Refill   aspirin  EC 81 MG tablet Take 1 tablet (81 mg total) by mouth daily. Swallow whole. 30 tablet 11   atorvastatin  (LIPITOR) 40 MG tablet Take 1 tablet (40 mg total) by mouth daily. 90 tablet 4   ciclopirox  (PENLAC ) 8 % solution Apply topically at bedtime. Apply over nail and surrounding skin. Apply daily over previous coat. After seven (7) days, may remove  with alcohol and continue cycle. 6.6 mL 0   diphenhydrAMINE (BENADRYL) 25 MG tablet Take 25 mg by mouth at bedtime as needed for allergies.     ipratropium (ATROVENT ) 0.06 % nasal spray Place 2 sprays into both nostrils 4 (four) times daily. 15 mL 5   ipratropium-albuterol  (DUONEB) 0.5-2.5 (3) MG/3ML SOLN Take 3 mLs by nebulization every 6 (six) hours as needed. TWICE A DAY 1080 mL 2   latanoprost  (XALATAN ) 0.005 % ophthalmic solution Place 1 drop into both eyes every evening. 7.5 mL 3   loperamide  (IMODIUM A-D) 2 MG tablet Take 2 mg by mouth 4 (four) times daily as needed for diarrhea or loose stools.     losartan  (COZAAR ) 25 MG tablet Take 1 tablet (25 mg total) by mouth daily. 90 tablet 3   nystatin -triamcinolone  (MYCOLOG II) cream Apply to affected area topically 2 (two) times a day. 15 g 1   pantoprazole  (PROTONIX ) 40 MG tablet Take 1 tablet (40 mg total) by mouth daily. 90 tablet 4   rosuvastatin  (CRESTOR ) 20 MG tablet Take 1 tablet (20 mg total) by mouth daily. 15 tablet 0   traZODone  (DESYREL ) 100 MG tablet Take 1 tablet (100 mg total) by mouth at bedtime. 90 tablet 3   traZODone  (DESYREL ) 50 MG tablet Take 0.5-1 tablets (25-50 mg total) by mouth at bedtime as needed for sleep 30 tablet 0   umeclidinium-vilanterol (ANORO ELLIPTA ) 62.5-25 MCG/ACT AEPB Inhale 1 puff into the lungs daily. 60 each 11   valACYclovir  (VALTREX ) 1000 MG tablet Take 2 tablets (2,000 mg total) by mouth at onset of symptoms and repeat in 12 hours. 4 tablet 1   albuterol  (VENTOLIN  HFA) 108 (90 Base) MCG/ACT inhaler Inhale 2 puffs into the lungs every 6 (six) hours as needed for wheezing or shortness of breath. 8 g 0   budesonide  (PULMICORT ) 0.5 MG/2ML nebulizer solution Take 2 mLs (0.5 mg total) by nebulization daily. 60 mL 5   chlorpheniramine-HYDROcodone (TUSSIONEX) 10-8 MG/5ML Take 5 mLs by mouth at bedtime as needed for cough. (Patient not taking: Reported on 09/13/2024) 115 mL 0   latanoprost  (XALATAN ) 0.005 % ophthalmic solution Place 1 drop into both eyes at bedtime. (Patient not taking: Reported on 09/13/2024)     losartan  (COZAAR ) 25 MG tablet Take 1 tablet (25 mg total) by mouth daily. (Patient not taking: Reported on 09/13/2024) 90 tablet 4   predniSONE  (DELTASONE ) 10 MG tablet Take 1 tablet (10 mg total) by mouth daily with breakfast. (Patient not taking: Reported on 09/13/2024) 20 tablet 0   amoxicillin -clavulanate (AUGMENTIN ) 875-125 MG tablet Take 1 tablet by mouth 2 (two) times daily. (Patient not  taking: Reported on 09/13/2024) 14 tablet 0   azithromycin  (ZITHROMAX ) 250 MG tablet Take 2 tablets today, then 1 tablet daily on days 2-5 (Patient not taking: Reported on 09/13/2024) 6 tablet 0   No facility-administered medications prior to visit.    Review of Systems  Constitutional:  Negative for chills, diaphoresis, fever, malaise/fatigue and weight loss.  HENT:  Positive for congestion.   Respiratory:  Positive for cough, sputum production and shortness of breath. Negative for hemoptysis and wheezing.   Cardiovascular:  Negative for chest pain, palpitations and leg swelling.     Objective:   Vitals:   09/13/24 0905  BP: 115/74  Pulse: 65  SpO2: 95%  Weight: 266 lb 1.6 oz (120.7 kg)  Height: 6' 1 (1.854 m)   SpO2: 95 %  Physical Exam: General: Well-appearing, no acute  distress HENT: [place], AT Eyes: EOMI, no scleral icterus Respiratory: Diminished but clear to auscultation bilaterally.  No crackles, wheezing or rales Cardiovascular: RRR, -M/R/G, no JVD Extremities:-Edema,-tenderness Neuro: AAO x4, CNII-XII grossly intact Psych: Normal mood, normal affect   Data Reviewed:  Imaging: CT Chest 07/05/18 - Minimal hazy mosaic attenuation throughout. No pulmonary nodules noted.  PFT: 05/24/20 FVC 2.8 (57%) FEV1 1.9 (52%) Ratio 67  Interpretation: Moderately severe obstructive defect  Labs: CBC    Component Value Date/Time   WBC 4.0 (L) 01/12/2011 1204   RBC 4.28 01/12/2011 1204   HGB 13.6 01/12/2011 1204   HCT 39.1 01/12/2011 1204   PLT 195.0 01/12/2011 1204   MCV 91.3 01/12/2011 1204   MCH 31.0 11/15/2010 1845   MCHC 34.8 01/12/2011 1204   RDW 14.2 01/12/2011 1204   LYMPHSABS 1.4 01/12/2011 1204   MONOABS 0.3 01/12/2011 1204   EOSABS 0.1 01/12/2011 1204   BASOSABS 0.0 01/12/2011 1204   Abs eos 01/12/11 - 100    Assessment & Plan:   Discussion: 79 year old male former smoker with childhood asthma and moderately severe COPD/asthma who presents for  follow-up. Has had multiple exacerbations (>3) in the last 12 months and now worsening dyspnea at baseline despite triple therapy. Will evaluate with PFTs to determine worsening COPD +/- deconditioning. Discussed benefits of pulmonary rehab.   Prior inhalers: Stiolto - not well-controlled Breztri  - intolerance due to thrush even with spacer  Moderate-severe COPD with asthma overlap (FEV 52%) - worsening COVID-19 long hauler with dyspnea --CONTINUE Anoro ONE puff ONCE a day --CONTINUE budesonide  nebulizer 1-2 times a day --CONTINUE Albuterol  handheld AS NEEDED for shortness of breath or wheezing --CONTINUE Duonebs AS NEEDED for severe shortness of breath or wheezing --REFER to pulmonary rehab --CONTINUE tussionex as needed. Can also use OTC like robitussin --ORDER pulmonary function test. Schedule when next available  Nasal congestion --CONTINUE zyrtec (generic: cetirizine) once a day --Recommend atrovent  nasal spray twice a day as needed --AVOID Afrin  Health Maintenance Immunization History  Administered Date(s) Administered   Fluad Quad(high Dose 65+) 05/20/2022   Fluzone  Influenza virus vaccine,trivalent (IIV3), split virus 06/25/2014, 05/23/2015, 04/10/2019, 07/08/2021   INFLUENZA, HIGH DOSE SEASONAL PF 07/17/2014, 07/22/2016, 05/28/2017, 06/24/2017, 04/10/2019, 07/03/2021, 07/11/2024   Influenza Inj Mdck Quad Pf 06/02/2018   Influenza Split 06/29/2008, 07/04/2009, 06/14/2014   Influenza Whole 05/24/2009, 10/27/2010   Influenza,inj,Quad PF,6+ Mos 06/06/2015   PFIZER(Purple Top)SARS-COV-2 Vaccination 10/05/2019, 10/30/2019, 05/20/2020, 12/06/2020   Pfizer(Comirnaty )Fall Seasonal Vaccine 12 years and older 07/27/2023   Pneumococcal Conjugate-13 11/05/2014   Pneumococcal Polysaccharide-23 08/06/2011, 04/14/2014   Respiratory Syncytial Virus Vaccine ,Recomb Aduvanted(Arexvy ) 05/20/2022, 07/17/2024   Td 09/01/2005   Unspecified SARS-COV-2 Vaccination 06/04/2022    CT Lung Screen  - not qualified  Orders Placed This Encounter  Procedures   AMB referral to pulmonary rehabilitation    Referral Priority:   Routine    Referral Type:   Consultation    Number of Visits Requested:   1   Pulmonary function test    Standing Status:   Future    Expiration Date:   09/13/2025    Where should this test be performed?:   Outpatient Pulmonary    What type of PFT is being ordered?:   Full PFT   Meds ordered this encounter  Medications   budesonide  (PULMICORT ) 0.5 MG/2ML nebulizer solution    Sig: Inhale 2 mLs (0.5 mg total) by nebulization daily    Dispense:  60 mL    Refill:  5   albuterol  (VENTOLIN  HFA) 108 (90 Base) MCG/ACT inhaler    Sig: Inhale 2 puffs into the lungs every 6 (six) hours as needed for wheezing or shortness of breath.    Dispense:  6.7 g    Refill:  1    Generic ok. OK for insurance/patient preferred    Return in about 4 months (around 01/11/2025).   I have spent a total time of 30-minutes on the day of the appointment including chart review, data review, collecting history, coordinating care and discussing medical diagnosis and plan with the patient/family. Past medical history, allergies, medications were reviewed. Pertinent imaging, labs and tests included in this note have been reviewed and interpreted independently by me.   Shaughnessy Gethers Slater Staff, MD [facility] 09/13/2024 9:32 AM     "

## 2024-09-15 ENCOUNTER — Other Ambulatory Visit (HOSPITAL_BASED_OUTPATIENT_CLINIC_OR_DEPARTMENT_OTHER): Payer: Self-pay

## 2024-09-15 MED ORDER — GUAIFENESIN-CODEINE 100-10 MG/5ML PO SOLN
5.0000 mL | Freq: Two times a day (BID) | ORAL | 0 refills | Status: AC | PRN
  Filled 2024-09-15: qty 210, 21d supply, fill #0

## 2024-09-18 NOTE — Telephone Encounter (Signed)
Please advise on PFT results.

## 2024-09-21 ENCOUNTER — Telehealth (HOSPITAL_COMMUNITY): Payer: Self-pay

## 2024-09-21 NOTE — Telephone Encounter (Signed)
 Called patient to see if he was interested in participating in the Pulmonary Rehab Program. Patient will come in for orientation on 2/04 and will attend the 10:15 exercise class.  Sent MyChart message.

## 2024-09-26 ENCOUNTER — Other Ambulatory Visit (HOSPITAL_BASED_OUTPATIENT_CLINIC_OR_DEPARTMENT_OTHER): Payer: Self-pay

## 2024-09-27 ENCOUNTER — Encounter (HOSPITAL_COMMUNITY): Payer: Self-pay

## 2024-09-27 ENCOUNTER — Encounter (HOSPITAL_COMMUNITY)
Admission: RE | Admit: 2024-09-27 | Discharge: 2024-09-27 | Disposition: A | Source: Ambulatory Visit | Attending: Pulmonary Disease

## 2024-09-27 ENCOUNTER — Other Ambulatory Visit (HOSPITAL_BASED_OUTPATIENT_CLINIC_OR_DEPARTMENT_OTHER): Payer: Self-pay

## 2024-09-27 VITALS — BP 108/64 | HR 55 | Ht 73.0 in | Wt 267.0 lb

## 2024-09-27 DIAGNOSIS — J449 Chronic obstructive pulmonary disease, unspecified: Secondary | ICD-10-CM

## 2024-09-27 NOTE — Progress Notes (Signed)
 Pulmonary Individual Treatment Plan  Patient Details  Name: Carlos Romero MRN: [id number] Date of Birth: [birth year] Referring Provider:   Conrad Row PULMONARY REHAB COPD ORIENTATION from 09/27/2024 in [facility]  Referring Provider Dr. Slater Staff    Initial Encounter Date:  Flowsheet Row PULMONARY REHAB COPD ORIENTATION from 09/27/2024 in [facility]  Date 09/27/24    Visit Diagnosis: Stage 2 moderate COPD by GOLD classification [facility])  Patient's Home Medications on Admission:  Current Medications[1]  Past Medical History: Past Medical History:  Diagnosis Date   Asthma    Barrett's esophagus    Bimalleolar fracture of right ankle    Chronic cough    onset Oct 2010, better after prednisone  short course 12-2009, sinus CT 11-06-10 bilateral maxillary, ethmoid and possibly frontal sinusitis>21 days augmentin    COPD (chronic obstructive pulmonary disease) (HCC)    gold stage 2, PFTs 06-12-09 FEV1 2.25 (65%) ratio 62 and no better with B2   Depression 08/24/1997   GERD (gastroesophageal reflux disease)    Hyperlipidemia    Hypertension    Lung disease    LLL air space disease   Prostate cancer (HCC) 08/24/2001   PUD (peptic ulcer disease) 08/24/1965   s/p perf   Pulmonary embolism (HCC) 08/24/1965   Solitary lung nodule    RML    Tobacco Use: Tobacco Use History[2]  Labs: Review Flowsheet       Latest Ref Rng & Units 02/22/2009 04/05/2020  Labs for ITP Cardiac and Pulmonary Rehab  Cholestrol 100 - 199 mg/dL - 808   LDL (calc) 0 - 99 mg/dL - 886   HDL-C >60 mg/dL - 49   Trlycerides 0 - 149 mg/dL - 836   TCO2 0 - 899 mmol/L 24  -    Capillary Blood Glucose: No results found for: GLUCAP   Pulmonary Assessment Scores:  Pulmonary Assessment Scores     Row Name 09/27/24 0924         ADL UCSD   ADL Phase Entry     SOB Score total 30        CAT Score   CAT Score 16       mMRC Score   mMRC Score 1       UCSD: Self-administered rating of dyspnea associated with activities of daily living (ADLs) 6-point scale (0 = not at all to 5 = maximal or unable to do because of breathlessness)  Scoring Scores range from 0 to 120.  Minimally important difference is 5 units  CAT: CAT can identify the health impairment of COPD patients and is better correlated with disease progression.  CAT has a scoring range of zero to 40. The CAT score is classified into four groups of low (less than 10), medium (10 - 20), high (21-30) and very high (31-40) based on the impact level of disease on health status. A CAT score over 10 suggests significant symptoms.  A worsening CAT score could be explained by an exacerbation, poor medication adherence, poor inhaler technique, or progression of COPD or comorbid conditions.  CAT MCID is 2 points  mMRC: mMRC (Modified Medical Research Council) Dyspnea Scale is used to assess the degree of baseline functional disability in patients of respiratory disease due to dyspnea. No minimal important difference is established. A decrease in score of 1 point or greater is considered a positive change.   Pulmonary Function Assessment:  Pulmonary Function Assessment - 09/27/24 0851       Breath   Bilateral Breath Sounds Clear    Shortness of Breath Yes;Limiting activity          Exercise Target Goals: Exercise Program Goal: Individual exercise prescription set using results from initial 6 min walk test and THRR while considering  patients activity barriers and safety.   Exercise Prescription Goal: Initial exercise prescription builds to 30-45 minutes a day of aerobic activity, 2-3 days per week.  Home exercise guidelines will be given to patient during program as part of exercise prescription that the participant will acknowledge.  Activity Barriers & Risk Stratification:  Activity Barriers & Cardiac Risk  Stratification - 09/27/24 0850       Activity Barriers & Cardiac Risk Stratification   Activity Barriers Deconditioning;Muscular Weakness;Shortness of Breath          6 Minute Walk:  6 Minute Walk     Row Name 09/27/24 0940         6 Minute Walk   Phase Initial     Distance 1080 feet     Walk Time 6 minutes     # of Rest Breaks 0     MPH 2.05     METS 1.4     RPE 9     Perceived Dyspnea  1     VO2 Peak 4.9     Symptoms No     Resting HR 55 bpm     Resting BP 108/64     Resting Oxygen Saturation  94 %     Exercise Oxygen Saturation  during 6 min walk 88 %     Max Ex. HR 78 bpm     Max Ex. BP 126/70     2 Minute Post BP 124/64       Interval HR   1 Minute HR 77     2 Minute HR 72     3 Minute HR 77     4 Minute HR 78     5 Minute HR 78     6 Minute HR 76     2 Minute Post HR 71     Interval Heart Rate? Yes       Interval Oxygen   Interval Oxygen? Yes     Baseline Oxygen Saturation % 94 %     1 Minute Oxygen Saturation % 88 %     1 Minute Liters of Oxygen 0 L     2 Minute Oxygen Saturation % 91 %     2 Minute Liters of Oxygen 0 L     3 Minute Oxygen Saturation % 89 %     3 Minute Liters of Oxygen 0 L     4 Minute Oxygen Saturation % 90 %     4 Minute Liters of Oxygen 0 L     5 Minute Oxygen Saturation % 90 %     5 Minute Liters of Oxygen 0 L     6 Minute Oxygen Saturation % 90 %     6 Minute Liters of Oxygen 0 L     2 Minute Post Oxygen Saturation % 94 %        Oxygen Initial Assessment:  Oxygen Initial Assessment - 09/27/24 0850       Home Oxygen   Home Oxygen Device None    Sleep Oxygen Prescription None    Home Exercise Oxygen Prescription None    Home Resting Oxygen  Prescription None      Initial 6 min Walk   Oxygen Used None      Program Oxygen Prescription   Program Oxygen Prescription None      Intervention   Short Term Goals To learn and understand importance of maintaining oxygen saturations>88%;To learn and demonstrate proper  use of respiratory medications;To learn and exhibit compliance with exercise, home and travel O2 prescription;To learn and understand importance of monitoring SPO2 with pulse oximeter and demonstrate accurate use of the pulse oximeter.;To learn and demonstrate proper pursed lip breathing techniques or other breathing techniques.     Long  Term Goals Verbalizes importance of monitoring SPO2 with pulse oximeter and return demonstration;Exhibits proper breathing techniques, such as pursed lip breathing or other method taught during program session;Demonstrates proper use of MDIs;Compliance with respiratory medication;Maintenance of O2 saturations>88%;Exhibits compliance with exercise, home  and travel O2 prescription          Oxygen Re-Evaluation:   Oxygen Discharge (Final Oxygen Re-Evaluation):   Initial Exercise Prescription:  Initial Exercise Prescription - 09/27/24 0900       Date of Initial Exercise RX and Referring Provider   Date 09/27/24    Referring Provider Dr. Slater Staff    Expected Discharge Date 12/19/24      Recumbant Elliptical   Level 1    RPM 50    Watts 60    Minutes 15    METs 2      Track   Laps 6   23ft track   Minutes 15    METs 1.92      Prescription Details   Frequency (times per week) 2    Duration Progress to 30 minutes of continuous aerobic without signs/symptoms of physical distress      Intensity   THRR 40-80% of Max Heartrate 57-114    Ratings of Perceived Exertion 11-13    Perceived Dyspnea 0-4      Progression   Progression Continue to progress workloads to maintain intensity without signs/symptoms of physical distress.      Resistance Training   Training Prescription Yes    Weight red bands    Reps 10-15          Perform Capillary Blood Glucose checks as needed.  Exercise Prescription Changes:   Exercise Comments:   Exercise Goals and Review:   Exercise Goals     Row Name 09/27/24 0850             Exercise  Goals   Increase Physical Activity Yes       Intervention Provide advice, education, support and counseling about physical activity/exercise needs.;Develop an individualized exercise prescription for aerobic and resistive training based on initial evaluation findings, risk stratification, comorbidities and participant's personal goals.       Expected Outcomes Short Term: Attend rehab on a regular basis to increase amount of physical activity.;Long Term: Exercising regularly at least 3-5 days a week.;Long Term: Add in home exercise to make exercise part of routine and to increase amount of physical activity.       Increase Strength and Stamina Yes       Intervention Develop an individualized exercise prescription for aerobic and resistive training based on initial evaluation findings, risk stratification, comorbidities and participant's personal goals.;Provide advice, education, support and counseling about physical activity/exercise needs.       Expected Outcomes Short Term: Increase workloads from initial exercise prescription for resistance, speed, and METs.;Short Term: Perform resistance training exercises routinely during rehab  and add in resistance training at home;Long Term: Improve cardiorespiratory fitness, muscular endurance and strength as measured by increased METs and functional capacity ( )       Able to understand and use rate of perceived exertion (RPE) scale Yes       Intervention Provide education and explanation on how to use RPE scale       Expected Outcomes Short Term: Able to use RPE daily in rehab to express subjective intensity level;Long Term:  Able to use RPE to guide intensity level when exercising independently       Able to understand and use Dyspnea scale Yes       Intervention Provide education and explanation on how to use Dyspnea scale       Expected Outcomes Short Term: Able to use Dyspnea scale daily in rehab to express subjective sense of shortness of breath during  exertion;Long Term: Able to use Dyspnea scale to guide intensity level when exercising independently       Knowledge and understanding of Target Heart Rate Range (THRR) Yes       Intervention Provide education and explanation of THRR including how the numbers were predicted and where they are located for reference       Expected Outcomes Short Term: Able to state/look up THRR;Short Term: Able to use daily as guideline for intensity in rehab;Long Term: Able to use THRR to govern intensity when exercising independently       Understanding of Exercise Prescription Yes       Intervention Provide education, explanation, and written materials on patient's individual exercise prescription       Expected Outcomes Short Term: Able to explain program exercise prescription;Long Term: Able to explain home exercise prescription to exercise independently          Exercise Goals Re-Evaluation :   Discharge Exercise Prescription (Final Exercise Prescription Changes):   Nutrition:  Target Goals: Understanding of nutrition guidelines, daily intake of sodium 1500mg , cholesterol 200mg , calories 30% from fat and 7% or less from saturated fats, daily to have 5 or more servings of fruits and vegetables.  Biometrics:  Pre Biometrics - 09/27/24 0948       Pre Biometrics   Grip Strength 12 kg           Nutrition Therapy Plan and Nutrition Goals:   Nutrition Assessments:  MEDIFICTS Score Key: >=70 Need to make dietary changes  40-70 Heart Healthy Diet <= 40 Therapeutic Level Cholesterol Diet   Picture Your Plate Scores: <59 Unhealthy dietary pattern with much room for improvement. 41-50 Dietary pattern unlikely to meet recommendations for good health and room for improvement. 51-60 More healthful dietary pattern, with some room for improvement.  >60 Healthy dietary pattern, although there may be some specific behaviors that could be improved.    Nutrition Goals Re-Evaluation:   Nutrition  Goals Discharge (Final Nutrition Goals Re-Evaluation):   Psychosocial: Target Goals: Acknowledge presence or absence of significant depression and/or stress, maximize coping skills, provide positive support system. Participant is able to verbalize types and ability to use techniques and skills needed for reducing stress and depression.  Initial Review & Psychosocial Screening:  Initial Psych Review & Screening - 09/27/24 0851       Initial Review   Current issues with None Identified      Family Dynamics   Good Support System? Yes      Barriers   Psychosocial barriers to participate in program There are no identifiable barriers or psychosocial  needs.      Screening Interventions   Interventions Encouraged to exercise    Expected Outcomes Short Term goal: Utilizing psychosocial counselor, staff and physician to assist with identification of specific Stressors or current issues interfering with healing process. Setting desired goal for each stressor or current issue identified.;Long Term Goal: Stressors or current issues are controlled or eliminated.;Short Term goal: Identification and review with participant of any Quality of Life or Depression concerns found by scoring the questionnaire.;Long Term goal: The participant improves quality of Life and PHQ9 Scores as seen by post scores and/or verbalization of changes          Quality of Life Scores:  Scores of 19 and below usually indicate a poorer quality of life in these areas.  A difference of  2-3 points is a clinically meaningful difference.  A difference of 2-3 points in the total score of the Quality of Life Index has been associated with significant improvement in overall quality of life, self-image, physical symptoms, and general health in studies assessing change in quality of life.  PHQ-9: Review Flowsheet       09/27/2024 06/02/2018  Depression screen PHQ 2/9  Decreased Interest 0 0  Down, Depressed, Hopeless 0 0  PHQ - 2  Score 0 0  Altered sleeping 3 -  Tired, decreased energy 0 -  Change in appetite 1 -  Feeling bad or failure about yourself  0 -  Trouble concentrating 0 -  Moving slowly or fidgety/restless 0 -  Suicidal thoughts 0 -  PHQ-9 Score 4 -  Difficult doing work/chores Not difficult at all -   Interpretation of Total Score  Total Score Depression Severity:  1-4 = Minimal depression, 5-9 = Mild depression, 10-14 = Moderate depression, 15-19 = Moderately severe depression, 20-27 = Severe depression   Psychosocial Evaluation and Intervention:  Psychosocial Evaluation - 09/27/24 0851       Psychosocial Evaluation & Interventions   Interventions Encouraged to exercise with the program and follow exercise prescription    Comments Marcey denies any psy/soc barriers or concerns to starting pulm rehab. He declines any additional needs, resources, or referrals at this time    Expected Outcomes For Marcey to participate in Pulm Rehab without any psy/soc barriers or concerns    Continue Psychosocial Services  No Follow up required          Psychosocial Re-Evaluation:   Psychosocial Discharge (Final Psychosocial Re-Evaluation):   Education: Education Goals: Education classes will be provided on a weekly basis, covering required topics. Participant will state understanding/return demonstration of topics presented.  Learning Barriers/Preferences:  Learning Barriers/Preferences - 09/27/24 9147       Learning Barriers/Preferences   Learning Barriers None    Learning Preferences None          Education Topics: Know Your Numbers Group instruction that is supported by a PowerPoint presentation. Instructor discusses importance of knowing and understanding resting, exercise, and post-exercise oxygen saturation, heart rate, and blood pressure. Oxygen saturation, heart rate, blood pressure, rating of perceived exertion, and dyspnea are reviewed along with a normal range for these values.     Exercise for the Pulmonary Patient Group instruction that is supported by a PowerPoint presentation. Instructor discusses benefits of exercise, core components of exercise, frequency, duration, and intensity of an exercise routine, importance of utilizing pulse oximetry during exercise, safety while exercising, and options of places to exercise outside of rehab.    MET Level  Group instruction provided by PowerPoint,  verbal discussion, and written material to support subject matter. Instructor reviews what METs are and how to increase METs.    Pulmonary Medications Verbally interactive group education provided by instructor with focus on inhaled medications and proper administration.   Anatomy and Physiology of the Respiratory System Group instruction provided by PowerPoint, verbal discussion, and written material to support subject matter. Instructor reviews respiratory cycle and anatomical components of the respiratory system and their functions. Instructor also reviews differences in obstructive and restrictive respiratory diseases with examples of each.    Oxygen Safety Group instruction provided by PowerPoint, verbal discussion, and written material to support subject matter. There is an overview of What is Oxygen and Why do we need it.  Instructor also reviews how to create a safe environment for oxygen use, the importance of using oxygen as prescribed, and the risks of noncompliance. There is a brief discussion on traveling with oxygen and resources the patient may utilize.   Oxygen Use Group instruction provided by PowerPoint, verbal discussion, and written material to discuss how supplemental oxygen is prescribed and different types of oxygen supply systems. Resources for more information are provided.    Breathing Techniques Group instruction that is supported by demonstration and informational handouts. Instructor discusses the benefits of pursed lip and diaphragmatic  breathing and detailed demonstration on how to perform both.     Risk Factor Reduction Group instruction that is supported by a PowerPoint presentation. Instructor discusses the definition of a risk factor, different risk factors for pulmonary disease, and how the heart and lungs work together.   Pulmonary Diseases Group instruction provided by PowerPoint, verbal discussion, and written material to support subject matter. Instructor gives an overview of the different type of pulmonary diseases. There is also a discussion on risk factors and symptoms as well as ways to manage the diseases.   Stress and Energy Conservation Group instruction provided by PowerPoint, verbal discussion, and written material to support subject matter. Instructor gives an overview of stress and the impact it can have on the body. Instructor also reviews ways to reduce stress. There is also a discussion on energy conservation and ways to conserve energy throughout the day.   Warning Signs and Symptoms Group instruction provided by PowerPoint, verbal discussion, and written material to support subject matter. Instructor reviews warning signs and symptoms of stroke, heart attack, cold and flu. Instructor also reviews ways to prevent the spread of infection.   Other Education Group or individual verbal, written, or video instructions that support the educational goals of the pulmonary rehab program.    Knowledge Questionnaire Score:  Knowledge Questionnaire Score - 09/27/24 0856       Knowledge Questionnaire Score   Pre Score 13/18          Core Components/Risk Factors/Patient Goals at Admission:  Personal Goals and Risk Factors at Admission - 09/27/24 0852       Core Components/Risk Factors/Patient Goals on Admission    Weight Management Weight Loss;Yes;Obesity    Intervention Weight Management: Develop a combined nutrition and exercise program designed to reach desired caloric intake, while maintaining  appropriate intake of nutrient and fiber, sodium and fats, and appropriate energy expenditure required for the weight goal.;Weight Management: Provide education and appropriate resources to help participant work on and attain dietary goals.;Weight Management/Obesity: Establish reasonable short term and long term weight goals.;Obesity: Provide education and appropriate resources to help participant work on and attain dietary goals.    Expected Outcomes Short Term: Continue to assess  and modify interventions until short term weight is achieved;Long Term: Adherence to nutrition and physical activity/exercise program aimed toward attainment of established weight goal;Weight Loss: Understanding of general recommendations for a balanced deficit meal plan, which promotes 1-2 lb weight loss per week and includes a negative energy balance of [phone] kcal/d;Understanding recommendations for meals to include 15-35% energy as protein, 25-35% energy from fat, 35-60% energy from carbohydrates, less than 200mg  of dietary cholesterol, 20-35 gm of total fiber daily;Understanding of distribution of calorie intake throughout the day with the consumption of 4-5 meals/snacks    Improve shortness of breath with ADL's Yes    Intervention Provide education, individualized exercise plan and daily activity instruction to help decrease symptoms of SOB with activities of daily living.    Expected Outcomes Short Term: Improve cardiorespiratory fitness to achieve a reduction of symptoms when performing ADLs;Long Term: Be able to perform more ADLs without symptoms or delay the onset of symptoms          Core Components/Risk Factors/Patient Goals Review:    Core Components/Risk Factors/Patient Goals at Discharge (Final Review):    ITP Comments:   Comments: Dr. Slater Staff is Medical Director for Pulmonary Rehab at [facility].       [1]  Current Outpatient Medications:    albuterol  (VENTOLIN  HFA) 108 (90 Base)  MCG/ACT inhaler, Inhale 2 puffs into the lungs every 6 (six) hours as needed for wheezing or shortness of breath., Disp: 6.7 g, Rfl: 1   aspirin  EC 81 MG tablet, Take 1 tablet (81 mg total) by mouth daily. Swallow whole., Disp: 30 tablet, Rfl: 11   atorvastatin  (LIPITOR) 40 MG tablet, Take 1 tablet (40 mg total) by mouth daily., Disp: 90 tablet, Rfl: 4   budesonide  (PULMICORT ) 0.5 MG/2ML nebulizer solution, Inhale 2 mLs (0.5 mg total) by nebulization daily, Disp: 60 mL, Rfl: 5   chlorpheniramine-HYDROcodone (TUSSIONEX) 10-8 MG/5ML, Take 5 mLs by mouth at bedtime as needed for cough., Disp: 115 mL, Rfl: 0   ciclopirox  (PENLAC ) 8 % solution, Apply topically at bedtime. Apply over nail and surrounding skin. Apply daily over previous coat. After seven (7) days, may remove with alcohol and continue cycle., Disp: 6.6 mL, Rfl: 0   diphenhydrAMINE (BENADRYL) 25 MG tablet, Take 25 mg by mouth at bedtime as needed for allergies., Disp: , Rfl:    guaiFENesin -codeine  100-10 MG/5ML syrup, Take 5 mLs by mouth 2 (two) times daily as needed (cough)., Disp: 210 mL, Rfl: 0   ipratropium (ATROVENT ) 0.06 % nasal spray, Place 2 sprays into both nostrils 4 (four) times daily., Disp: 15 mL, Rfl: 5   ipratropium-albuterol  (DUONEB) 0.5-2.5 (3) MG/3ML SOLN, Take 3 mLs by nebulization every 6 (six) hours as needed. TWICE A DAY, Disp: 1080 mL, Rfl: 2   latanoprost  (XALATAN ) 0.005 % ophthalmic solution, Place 1 drop into both eyes at bedtime., Disp: , Rfl:    latanoprost  (XALATAN ) 0.005 % ophthalmic solution, Place 1 drop into both eyes every evening., Disp: 7.5 mL, Rfl: 3   loperamide (IMODIUM A-D) 2 MG tablet, Take 2 mg by mouth 4 (four) times daily as needed for diarrhea or loose stools., Disp: , Rfl:    losartan  (COZAAR ) 25 MG tablet, Take 1 tablet (25 mg total) by mouth daily., Disp: 90 tablet, Rfl: 3   losartan  (COZAAR ) 25 MG tablet, Take 1 tablet (25 mg total) by mouth daily., Disp: 90 tablet, Rfl: 4    nystatin -triamcinolone  (MYCOLOG II) cream, Apply to affected area topically 2 (  two) times a day., Disp: 15 g, Rfl: 1   pantoprazole  (PROTONIX ) 40 MG tablet, Take 1 tablet (40 mg total) by mouth daily., Disp: 90 tablet, Rfl: 4   rosuvastatin  (CRESTOR ) 20 MG tablet, Take 1 tablet (20 mg total) by mouth daily., Disp: 15 tablet, Rfl: 0   traZODone  (DESYREL ) 50 MG tablet, Take 0.5-1 tablets (25-50 mg total) by mouth at bedtime as needed for sleep, Disp: 30 tablet, Rfl: 0   umeclidinium-vilanterol (ANORO ELLIPTA ) 62.5-25 MCG/ACT AEPB, Inhale 1 puff into the lungs daily., Disp: 60 each, Rfl: 11   valACYclovir  (VALTREX ) 1000 MG tablet, Take 2 tablets (2,000 mg total) by mouth at onset of symptoms and repeat in 12 hours., Disp: 4 tablet, Rfl: 1   predniSONE  (DELTASONE ) 10 MG tablet, Take 1 tablet (10 mg total) by mouth daily with breakfast. (Patient not taking: Reported on 09/27/2024), Disp: 20 tablet, Rfl: 0   traZODone  (DESYREL ) 100 MG tablet, Take 1 tablet (100 mg total) by mouth at bedtime. (Patient not taking: Reported on 09/27/2024), Disp: 90 tablet, Rfl: 3 [2]  Social History Tobacco Use  Smoking Status Former   Current packs/day: 0.00   Average packs/day: 1 pack/day for 26.0 years (26.0 ttl pk-yrs)   Types: Cigarettes   Start date: 08/1960   Quit date: 08/24/1986   Years since quitting: 38.1  Smokeless Tobacco Never

## 2024-09-27 NOTE — Progress Notes (Signed)
 Carlos Romero 79 y.o. male  Initial Psychosocial Assessment  Pt psychosocial assessment reveals pt lives with their spouse. Pt is currently retired. Pt hobbies include watching tv. Pt reports his  stress level is low. Areas of stress/anxiety include health.  Pt does not exhibit signs of possible depression. PHQ 2/9 is 0/4. Signs of depression include hopelessness and fatigue. Pt shows good  coping skills with positive outlook . Offered emotional support and reassurance. Monitor and evaluate progress toward psychosocial goal(s).  Goal(s): Improved management of stress Improved coping skills Help patient work toward returning to meaningful activities that improve patient's QOL and are attainable with patient's lung disease   09/27/2024 9:37 AM

## 2024-09-27 NOTE — Progress Notes (Signed)
 Carlos Romero 79 y.o. male Pulmonary Rehab Orientation Note This patient who was referred to Pulmonary Rehab by Dr. Kassie with the diagnosis of COPD 2 arrived today in Cardiac and Pulmonary Rehab. He arrived ambulatory with normal gait. He does not carry portable oxygen. Per patient, Carlos Romero uses oxygen never. Color good, skin warm and dry. Patient is oriented to time and place. Patient's medical history, psychosocial health, and medications reviewed. Psychosocial assessment reveals patient lives with spouse. Carlos Romero is currently retired. Patient hobbies include watching tv. Patient reports his stress level is low. Areas of stress/anxiety include health. Patient does not exhibit signs of possibly depression. Signs of depression include hopelessness and fatigue. PHQ2/9 score 0/4. Carlos Romero shows good  coping skills with positive outlook on life. Offered emotional support and reassurance. Will continue to monitor and evaluate progress toward psychosocial goal(s) of decreased health related stress. Physical assessment reveals heart rate is normal, breath sounds clear to auscultation, no wheezes, rales, or rhonchi. Grip strength equal, strong. Distal pulses present. Carlos Romero reports he  does take medications as prescribed. Patient states he  follows a regular  diet. The patient has been trying to lose weight through a healthy diet and exercise program. Pt's weight will be monitored closely. Demonstration and practice of PLB using pulse oximeter. Carlos Romero able to return demonstration satisfactorily. Safety and hand hygiene in the exercise area reviewed with patient. Carlos Romero voices understanding of the information reviewed. Department expectations discussed with patient and achievable goals were set. The patient shows enthusiasm about attending the program and we look forward to working with Carlos Romero Carlos completed a 6 min walk test today and is scheduled to begin exercise on 2/10 at 1015.   [id number] Melissia Lahman

## 2024-10-03 ENCOUNTER — Encounter (HOSPITAL_COMMUNITY)

## 2024-10-05 ENCOUNTER — Encounter (HOSPITAL_COMMUNITY)

## 2024-10-10 ENCOUNTER — Encounter (HOSPITAL_COMMUNITY)

## 2024-10-12 ENCOUNTER — Encounter (HOSPITAL_COMMUNITY)

## 2024-10-17 ENCOUNTER — Encounter (HOSPITAL_COMMUNITY)

## 2024-10-19 ENCOUNTER — Encounter (HOSPITAL_COMMUNITY)

## 2024-10-24 ENCOUNTER — Encounter (HOSPITAL_COMMUNITY)

## 2024-10-26 ENCOUNTER — Encounter (HOSPITAL_COMMUNITY)

## 2024-10-31 ENCOUNTER — Encounter (HOSPITAL_COMMUNITY)

## 2024-11-02 ENCOUNTER — Encounter (HOSPITAL_COMMUNITY)

## 2024-11-07 ENCOUNTER — Encounter (HOSPITAL_COMMUNITY)

## 2024-11-09 ENCOUNTER — Encounter (HOSPITAL_COMMUNITY)

## 2024-11-14 ENCOUNTER — Encounter (HOSPITAL_COMMUNITY)

## 2024-11-16 ENCOUNTER — Encounter (HOSPITAL_COMMUNITY)

## 2024-11-21 ENCOUNTER — Encounter (HOSPITAL_COMMUNITY)

## 2024-11-23 ENCOUNTER — Encounter (HOSPITAL_COMMUNITY)

## 2024-11-28 ENCOUNTER — Encounter (HOSPITAL_COMMUNITY)

## 2024-11-30 ENCOUNTER — Encounter (HOSPITAL_COMMUNITY)

## 2024-12-05 ENCOUNTER — Encounter (HOSPITAL_COMMUNITY)

## 2024-12-07 ENCOUNTER — Encounter (HOSPITAL_COMMUNITY)

## 2024-12-12 ENCOUNTER — Encounter (HOSPITAL_COMMUNITY)

## 2024-12-14 ENCOUNTER — Encounter (HOSPITAL_COMMUNITY)

## 2024-12-19 ENCOUNTER — Encounter (HOSPITAL_COMMUNITY)

## 2025-01-16 ENCOUNTER — Ambulatory Visit (HOSPITAL_BASED_OUTPATIENT_CLINIC_OR_DEPARTMENT_OTHER): Admitting: Pulmonary Disease
